# Patient Record
Sex: Male | Born: 1960 | Race: White | Hispanic: No | Marital: Married | Smoking: Current every day smoker
Health system: Southern US, Community
[De-identification: ages and names within clinical notes are randomized; demographics above are authoritative.]

## PROBLEM LIST (undated history)

## (undated) DIAGNOSIS — I6529 Occlusion and stenosis of unspecified carotid artery: Secondary | ICD-10-CM

## (undated) DIAGNOSIS — I2510 Atherosclerotic heart disease of native coronary artery without angina pectoris: Secondary | ICD-10-CM

## (undated) DIAGNOSIS — I219 Acute myocardial infarction, unspecified: Secondary | ICD-10-CM

## (undated) DIAGNOSIS — F419 Anxiety disorder, unspecified: Secondary | ICD-10-CM

## (undated) DIAGNOSIS — I10 Essential (primary) hypertension: Secondary | ICD-10-CM

## (undated) DIAGNOSIS — E785 Hyperlipidemia, unspecified: Secondary | ICD-10-CM

## (undated) DIAGNOSIS — R0602 Shortness of breath: Secondary | ICD-10-CM

## (undated) HISTORY — DX: Acute myocardial infarction, unspecified: I21.9

## (undated) HISTORY — DX: Hyperlipidemia, unspecified: E78.5

## (undated) HISTORY — DX: Shortness of breath: R06.02

## (undated) HISTORY — DX: Anxiety disorder, unspecified: F41.9

## (undated) HISTORY — DX: Occlusion and stenosis of unspecified carotid artery: I65.29

## (undated) HISTORY — DX: Atherosclerotic heart disease of native coronary artery without angina pectoris: I25.10

## (undated) HISTORY — DX: Essential (primary) hypertension: I10

## (undated) HISTORY — PX: FINGER SURGERY: SHX640

## (undated) HISTORY — PX: CORONARY ANGIOPLASTY WITH STENT PLACEMENT: SHX49

---

## 2000-05-03 ENCOUNTER — Inpatient Hospital Stay (HOSPITAL_COMMUNITY): Admission: EM | Admit: 2000-05-03 | Discharge: 2000-05-06 | Payer: Self-pay | Admitting: Cardiology

## 2001-02-22 ENCOUNTER — Encounter: Payer: Self-pay | Admitting: Internal Medicine

## 2001-02-22 ENCOUNTER — Observation Stay (HOSPITAL_COMMUNITY): Admission: EM | Admit: 2001-02-22 | Discharge: 2001-02-23 | Payer: Self-pay | Admitting: Internal Medicine

## 2006-05-06 ENCOUNTER — Ambulatory Visit: Payer: Self-pay | Admitting: Internal Medicine

## 2006-05-06 ENCOUNTER — Ambulatory Visit: Payer: Self-pay

## 2006-05-08 ENCOUNTER — Ambulatory Visit: Payer: Self-pay | Admitting: Internal Medicine

## 2006-05-08 ENCOUNTER — Inpatient Hospital Stay (HOSPITAL_BASED_OUTPATIENT_CLINIC_OR_DEPARTMENT_OTHER): Admission: RE | Admit: 2006-05-08 | Discharge: 2006-05-08 | Payer: Self-pay | Admitting: Internal Medicine

## 2006-05-13 ENCOUNTER — Ambulatory Visit (HOSPITAL_COMMUNITY): Admission: RE | Admit: 2006-05-13 | Discharge: 2006-05-14 | Payer: Self-pay | Admitting: Cardiology

## 2006-05-13 ENCOUNTER — Ambulatory Visit: Payer: Self-pay | Admitting: Cardiology

## 2006-05-21 ENCOUNTER — Encounter (HOSPITAL_COMMUNITY): Admission: RE | Admit: 2006-05-21 | Discharge: 2006-08-19 | Payer: Self-pay | Admitting: Cardiology

## 2006-06-04 ENCOUNTER — Ambulatory Visit: Payer: Self-pay | Admitting: Internal Medicine

## 2006-06-04 ENCOUNTER — Ambulatory Visit: Payer: Self-pay | Admitting: Cardiology

## 2006-08-13 ENCOUNTER — Ambulatory Visit: Payer: Self-pay | Admitting: Internal Medicine

## 2006-11-09 ENCOUNTER — Ambulatory Visit: Payer: Self-pay | Admitting: Internal Medicine

## 2006-11-09 LAB — CONVERTED CEMR LAB
ALT: 26 units/L (ref 0–40)
AST: 27 units/L (ref 0–37)
Albumin: 3.7 g/dL (ref 3.5–5.2)
Alkaline Phosphatase: 55 units/L (ref 39–117)
BUN: 8 mg/dL (ref 6–23)
Bilirubin, Direct: 0.1 mg/dL (ref 0.0–0.3)
CO2: 27 meq/L (ref 19–32)
Calcium: 9.1 mg/dL (ref 8.4–10.5)
Chloride: 107 meq/L (ref 96–112)
Cholesterol: 221 mg/dL (ref 0–200)
Creatinine, Ser: 0.7 mg/dL (ref 0.4–1.5)
Direct LDL: 147.1 mg/dL
GFR calc Af Amer: 155 mL/min
GFR calc non Af Amer: 128 mL/min
Glucose, Bld: 92 mg/dL (ref 70–99)
HDL: 32.9 mg/dL — ABNORMAL LOW (ref >39.0)
Potassium: 4.3 meq/L (ref 3.5–5.1)
Sodium: 140 meq/L (ref 135–145)
Total Bilirubin: 0.6 mg/dL (ref 0.3–1.2)
Total CHOL/HDL Ratio: 6.7
Total Protein: 6.6 g/dL (ref 6.0–8.3)
Triglycerides: 168 mg/dL — ABNORMAL HIGH (ref 0–149)
VLDL: 34 mg/dL (ref 0–40)

## 2006-11-17 ENCOUNTER — Ambulatory Visit: Payer: Self-pay | Admitting: Internal Medicine

## 2007-01-30 ENCOUNTER — Ambulatory Visit: Payer: Self-pay | Admitting: Cardiology

## 2007-01-30 ENCOUNTER — Inpatient Hospital Stay (HOSPITAL_COMMUNITY): Admission: EM | Admit: 2007-01-30 | Discharge: 2007-02-02 | Payer: Self-pay | Admitting: *Deleted

## 2007-02-15 ENCOUNTER — Ambulatory Visit: Payer: Self-pay | Admitting: Internal Medicine

## 2007-02-15 LAB — CONVERTED CEMR LAB
Cholesterol: 152 mg/dL (ref 0–200)
HDL: 21.9 mg/dL — ABNORMAL LOW (ref >39.0)
LDL Cholesterol: 94 mg/dL (ref 0–99)
Total CHOL/HDL Ratio: 6.9
Triglycerides: 179 mg/dL — ABNORMAL HIGH (ref 0–149)
VLDL: 36 mg/dL (ref 0–40)

## 2007-02-17 ENCOUNTER — Ambulatory Visit: Payer: Self-pay | Admitting: Cardiology

## 2007-02-18 ENCOUNTER — Encounter (HOSPITAL_COMMUNITY): Admission: RE | Admit: 2007-02-18 | Discharge: 2007-05-19 | Payer: Self-pay | Admitting: Cardiology

## 2007-04-29 ENCOUNTER — Ambulatory Visit: Payer: Self-pay | Admitting: Internal Medicine

## 2007-07-31 ENCOUNTER — Ambulatory Visit: Payer: Self-pay | Admitting: Cardiology

## 2007-07-31 ENCOUNTER — Observation Stay (HOSPITAL_COMMUNITY): Admission: EM | Admit: 2007-07-31 | Discharge: 2007-08-01 | Payer: Self-pay | Admitting: Emergency Medicine

## 2008-06-23 ENCOUNTER — Emergency Department (HOSPITAL_COMMUNITY): Admission: EM | Admit: 2008-06-23 | Discharge: 2008-06-23 | Payer: Self-pay | Admitting: Emergency Medicine

## 2010-05-14 ENCOUNTER — Emergency Department (HOSPITAL_COMMUNITY): Admission: EM | Admit: 2010-05-14 | Discharge: 2010-05-15 | Payer: Self-pay | Admitting: Emergency Medicine

## 2010-08-25 ENCOUNTER — Emergency Department (HOSPITAL_COMMUNITY)
Admission: EM | Admit: 2010-08-25 | Discharge: 2010-08-25 | Payer: Self-pay | Source: Home / Self Care | Admitting: Emergency Medicine

## 2010-08-25 ENCOUNTER — Encounter: Payer: Self-pay | Admitting: Internal Medicine

## 2010-09-16 ENCOUNTER — Ambulatory Visit: Admit: 2010-09-16 | Payer: Self-pay | Admitting: Internal Medicine

## 2010-09-17 ENCOUNTER — Encounter (INDEPENDENT_AMBULATORY_CARE_PROVIDER_SITE_OTHER): Payer: Self-pay | Admitting: *Deleted

## 2010-10-10 NOTE — Letter (Signed)
Summary: [facility]  [facility]   Imported By: Marylou Mccoy 09/04/2010 12:31:39  _____________________________________________________________________  External Attachment:    Type:   Image     Comment:   External Document

## 2010-10-10 NOTE — Letter (Signed)
Summary: Appointment - Missed  [facility], Main Office  [address]   [place], [place] [address]   Phone: [phone]  Fax: [phone]     September 17, 2010 MRN: [id number]   SONYA PUCCI [address] [place], [place]  [address]   Dear Mr. Fredlund,  Our records indicate you missed your appointment on 09/16/10 with Dr. Gala Romney .It is very important that we reach you to reschedule this appointment. We look forward to participating in your health care needs. Please contact us at the number listed above at your earliest convenience to reschedule this appointment.     Sincerely,  Artist

## 2010-11-11 ENCOUNTER — Ambulatory Visit (INDEPENDENT_AMBULATORY_CARE_PROVIDER_SITE_OTHER): Payer: Medicare Other | Admitting: Internal Medicine

## 2010-11-11 ENCOUNTER — Encounter: Payer: Self-pay | Admitting: Internal Medicine

## 2010-11-11 DIAGNOSIS — E785 Hyperlipidemia, unspecified: Secondary | ICD-10-CM

## 2010-11-11 DIAGNOSIS — R0989 Other specified symptoms and signs involving the circulatory and respiratory systems: Secondary | ICD-10-CM

## 2010-11-11 DIAGNOSIS — I2510 Atherosclerotic heart disease of native coronary artery without angina pectoris: Secondary | ICD-10-CM

## 2010-11-11 HISTORY — DX: Atherosclerotic heart disease of native coronary artery without angina pectoris: I25.10

## 2010-11-11 HISTORY — DX: Other specified symptoms and signs involving the circulatory and respiratory systems: R09.89

## 2010-11-18 ENCOUNTER — Telehealth: Payer: Self-pay | Admitting: Internal Medicine

## 2010-11-18 LAB — POCT I-STAT, CHEM 8
Creatinine, Ser: 0.8 mg/dL (ref 0.4–1.5)
HCT: 44 % (ref 39.0–52.0)
Hemoglobin: 15 g/dL (ref 13.0–17.0)
Potassium: 4.7 mEq/L (ref 3.5–5.1)
Sodium: 140 mEq/L (ref 135–145)
TCO2: 28 mmol/L (ref 0–100)

## 2010-11-18 LAB — CBC
MCH: 29.1 pg (ref 26.0–34.0)
Platelets: 263 10*3/uL (ref 150–400)
RBC: 4.98 MIL/uL (ref 4.22–5.81)
RDW: 13.5 % (ref 11.5–15.5)
WBC: 8.5 10*3/uL (ref 4.0–10.5)

## 2010-11-18 LAB — POCT CARDIAC MARKERS
CKMB, poc: <1 ng/mL — ABNORMAL LOW (ref 1.0–8.0)
Myoglobin, poc: 44.9 ng/mL (ref 12–200)

## 2010-11-18 LAB — DIFFERENTIAL
Basophils Absolute: 0 10*3/uL (ref 0.0–0.1)
Eosinophils Absolute: 0.2 10*3/uL (ref 0.0–0.7)
Lymphs Abs: 1.6 10*3/uL (ref 0.7–4.0)
Neutrophils Relative %: 71 % (ref 43–77)

## 2010-11-19 NOTE — Assessment & Plan Note (Signed)
Summary: F3M/per pt call-mj   Visit Type:  Follow-up Primary Provider:  Caffie Damme, [facility] in [place]  CC:  No complaints.  History of Present Illness: Aaron Velez is a very pleasant 50 year old male with a history of coronary artery disease, status post non-ST elevation myocardial infarction in May of 2008 due to restenosis of a previously placed stent.  He underwent balloon angioplasty.  Ejection fraction was 60%.  There was moderate nonobstructive disease elsewhere.  The left circumflex stent was patent.  He also has a history of hypertension, hyperlipidemia, and tobacco use.  He returns today for his first f/u since 2008.  Got disability last November. Doing well. Main problem is anxiety attacks and has gone to [facility] ER several times and treated with Xanax. During the events gets tight in his chest and SOB. Resolves with Xanax. Walks the dog for 20-30 mins 3-4x/day without a problem. Taking all his medicines without a problem. Had stress test at [facility] last year and it was OK.   Still smoking 1-1.5ppd.  No claudication. Lipids being followed by Dr. Katrinka Blazing.       Problems Prior to Update: None  Medications Prior to Update: 1)  None  Current Medications (verified): 1)  Pravastatin Sodium 40 Mg Tabs (Pravastatin Sodium) .... Take One Tablet By Mouth Daily At Bedtime 2)  Plavix 75 Mg Tabs (Clopidogrel Bisulfate) .... Take One Tablet By Mouth Daily 3)  Aspirin 81 Mg Tbec (Aspirin) .... Take One Tablet By Mouth Daily 4)  Carvedilol 25 Mg Tabs (Carvedilol) .... Take One Tablet By Mouth Twice A Day 5)  Lexapro 10 Mg Tabs (Escitalopram Oxalate) .... Take 1 Tablet By Mouth Once A Day 6)  Meloxicam 15 Mg Tabs (Meloxicam) .... Take 1 Tablet By Mouth Once A Day 7)  Multivitamins  Tabs (Multiple Vitamin) .... Take 1 Tablet By Mouth Once A Day 8)  Alprazolam 0.25 Mg Tabs (Alprazolam) .... Q 6 Hours As Needed  Allergies (verified): No Known Drug Allergies  Past  History:  Past Medical History: Last updated: 10/07/2008  1. Coronary artery disease.       a.     Non-ST-elevation MI with stent restenosis Jan 30, 2007,       b.     PCI of RCA with balloon angioplasty May 2008.       c.     ST elevation MI in 2001, bare metal stent to RCA.       d.     Non-ST-elevation MI 2007 with bare metal stent to LCX  2. Medical noncompliance secondary to financial strain.   3. Tobacco abuse   4. Hypertension.   5. Hyperlipidemia.   6. Ejection fraction 60% with no evidence of failure in 2008.   Family History: Last updated: 10/07/2008 Family history positive for hypertension, coronary  artery disease in first degree relatives.  Social History: Last updated: 10/07/2008 Married, four children ages 59-25, one pack per day  smoker, did quit for a period of 3-4 months while insured on Chantix, no  alcohol abuse.  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   Family History: Reviewed history from 10/07/2008 and no changes required. Family history positive for hypertension, coronary  artery disease in first degree relatives.  Social History: Reviewed history from 10/07/2008 and no changes required. Married, four children ages 64-25, one pack per day  smoker, did quit for a period of 3-4 months  while insured on Chantix, no  alcohol abuse.  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   Review of Systems       As per HPI and past medical history; otherwise all systems negative.   Vital Signs:  Patient profile:   50 year old male Height:      69 inches Weight:      139.75 pounds BMI:     20.71 Pulse rate:   50 / minute Pulse rhythm:   irregular Resp:     18 per minute BP sitting:   110 / 60  (right arm) Cuff size:   large  Vitals Entered By: Vikki Ports (November 11, 2010 10:19 AM)  Physical Exam  General:   thin no resp difficulty HEENT: normal Neck: supple. no JVD. Carotids 2+ bilat; bilat bruits L> R. No lymphadenopathy or thryomegaly appreciated. Cor: PMI nondisplaced. Brady and regular No rubs, gallops, murmur. Lungs: clear mildly decreased breath sounds Abdomen: soft, nontender, nondistended. No hepatosplenomegaly. No bruits or masses. Good bowel sounds. Extremities: no cyanosis, clubbing, rash, edema Neuro: alert & orientedx3, cranial nerves grossly intact. moves all 4 extremities w/o difficulty. affect pleasant    Impression & Recommendations:  Problem # 1:  CORONARY ATHEROSCLEROSIS NATIVE CORONARY ARTERY (ICD-414.01) Stable. Recent stress test without ischemia. Continue risk factor modification.   Problem # 2:  CAROTID BRUIT (ICD-785.9) Will check carotid ultrasound.   Problem # 3:  HYPERLIPIDEMIA Followed by PCP. Goal LDL < 70. Continue pravastatin. Can switch to generic atorvastatin if more potent statin needed.  Problem # 4:  TOBACCO ABUSE Counseled on need to quit and gave number for [facility]. He will resume Chantix.   Other Orders: EKG w/ Interpretation (93000) Carotid Duplex (Carotid Duplex)  Patient Instructions: 1)  Your physician has requested that you have a carotid duplex. This test is an ultrasound of the carotid arteries in your neck. It looks at blood flow through these arteries that supply the brain with blood. Allow one hour for this exam. There are no restrictions or special instructions. 2)  Your physician wants you to follow-up in: 1 year.  You will receive a reminder letter in the mail two months in advance. If you don't receive a letter, please call our office to schedule the follow-up appointment.

## 2010-11-21 LAB — CBC
MCH: 30.6 pg (ref 26.0–34.0)
MCHC: 34.7 g/dL (ref 30.0–36.0)
MCV: 88.2 fL (ref 78.0–100.0)
Platelets: 231 10*3/uL (ref 150–400)
RBC: 4.74 MIL/uL (ref 4.22–5.81)
RDW: 13.8 % (ref 11.5–15.5)

## 2010-11-21 LAB — DIFFERENTIAL
Basophils Absolute: 0.1 10*3/uL (ref 0.0–0.1)
Basophils Relative: 1 % (ref 0–1)
Eosinophils Absolute: 0.2 10*3/uL (ref 0.0–0.7)
Eosinophils Relative: 2 % (ref 0–5)
Neutrophils Relative %: 72 % (ref 43–77)

## 2010-11-21 LAB — POCT I-STAT, CHEM 8
Creatinine, Ser: 0.7 mg/dL (ref 0.4–1.5)
HCT: 45 % (ref 39.0–52.0)
Hemoglobin: 15.3 g/dL (ref 13.0–17.0)
Potassium: 4.1 mEq/L (ref 3.5–5.1)
Sodium: 140 mEq/L (ref 135–145)
TCO2: 26 mmol/L (ref 0–100)

## 2010-11-21 LAB — POCT CARDIAC MARKERS
CKMB, poc: <1 ng/mL — ABNORMAL LOW (ref 1.0–8.0)
Myoglobin, poc: 40.8 ng/mL (ref 12–200)

## 2010-11-22 ENCOUNTER — Encounter (INDEPENDENT_AMBULATORY_CARE_PROVIDER_SITE_OTHER): Payer: Medicare Other

## 2010-11-22 DIAGNOSIS — R0989 Other specified symptoms and signs involving the circulatory and respiratory systems: Secondary | ICD-10-CM

## 2010-11-22 DIAGNOSIS — I6529 Occlusion and stenosis of unspecified carotid artery: Secondary | ICD-10-CM

## 2010-11-25 ENCOUNTER — Telehealth: Payer: Self-pay | Admitting: Internal Medicine

## 2010-11-25 DIAGNOSIS — I6529 Occlusion and stenosis of unspecified carotid artery: Secondary | ICD-10-CM

## 2010-11-25 HISTORY — DX: Occlusion and stenosis of unspecified carotid artery: I65.29

## 2010-11-26 NOTE — Progress Notes (Signed)
Summary: question on plavix  Phone Note Call from Patient Call back at Home Phone [phone]   Caller: Patient Reason for Call: Talk to Nurse Summary of Call: pt hs question re plavix.  Initial call taken by: Roe Coombs,  November 18, 2010 9:36 AM  Follow-up for Phone Call        pt ruptured blood vessel in his eye, went to MD this AM they want him to come off Plavix for 2 weeks to give it time to heal, will discuss w/MD and call him back Meredith Staggers, RN  November 18, 2010 10:40 AM   per Dr Clifton James ok to hold Plavix for 2 weeks, left VM for pt Meredith Staggers, RN  November 19, 2010 11:12 AM

## 2010-11-27 ENCOUNTER — Telehealth: Payer: Self-pay | Admitting: Internal Medicine

## 2010-11-27 NOTE — Telephone Encounter (Signed)
LOv,12 faxed to [facility] @ [phone]

## 2010-12-02 ENCOUNTER — Encounter (INDEPENDENT_AMBULATORY_CARE_PROVIDER_SITE_OTHER): Payer: Medicare Other | Admitting: Surgery

## 2010-12-02 DIAGNOSIS — I6529 Occlusion and stenosis of unspecified carotid artery: Secondary | ICD-10-CM

## 2010-12-03 NOTE — Assessment & Plan Note (Signed)
OFFICE VISIT  Aaron Velez, Aaron Velez DOB:  14-Oct-1960                                       12/02/2010 ZOXWR#:[id number]  REASON FOR CONSULTATION:  Right carotid stenosis.  HISTORY:  This is a 50 year old gentleman I am seeing at the request of Dr. Gala Romney for evaluation of asymptomatic right carotid stenosis. The patient recently had a duplex ultrasound of his neck secondary to carotid bruits which showed 80% to 99% right carotid stenosis with 0% to 39% on the left.  The patient denies having any symptoms.  Specifically he denies numbness or weakness in either extremity.  He denies amaurosis fugax.  He denies slurred speech.  The patient has an extensive smoking history.  He has now cut down to 1 pack a day and is trying to quit.  He is getting ready to start Wellbutrin.  He also suffers from hypertension and hyperlipidemia, both of which are medically managed.  He has a history of having had a heart attack in 2001 as well as 2008.  He has undergone coronary artery stenting in 2001 and 2007 and then in-stent stenosis in 2008.  REVIEW OF SYSTEMS:  VASCULAR:  Positive pain in the legs with walking. CARDIAC:  Positive for chest pain, chest tightness, shortness of breath when lying flat and with exertion. All other review of systems are negative, as documented in the encounter form.  PAST MEDICAL HISTORY:  Hypertension, hypercholesterolemia, coronary artery disease.  SOCIAL HISTORY:  He is married with 2 children.  He is retired.  Smokes 1 pack a day.  Does not drink.  FAMILY HISTORY:  Positive for hypertension, coronary artery disease.  ALLERGIES:  None.  PHYSICAL EXAMINATION:  Heart rate 60, blood pressure 102/65 on the right and 199/66 on the left, respiratory rate 14.  General:  He is well- appearing in no distress.  HEENT within normal limits.  Lungs are clear bilaterally.  Cardiovascular:  Regular rate and rhythm.  Bilateral carotid bruits.   Abdomen:  Soft, nontender.  No pulsatile mass. Musculoskeletal is without major deformities.  Skin is without rash. Neurologically, he is intact without focal deficits.  Cranial nerves II- XII are grossly intact.  DIAGNOSTIC STUDIES:  I have reviewed his ultrasound from [place] which shows 80% to 99% right carotid stenosis in the proximal internal carotid artery.  There is 0% to 39% stenosis on the left.  ASSESSMENT/PLAN:  Asymptomatic right carotid stenosis.  I discussed the pros and cons of carotid revascularization with the patient.  He has elected to proceed with carotid endarterectomy.  We also discussed stenting.  However, I think he is a better candidate for an endarterectomy.  I discussed the risks of surgery, including the risk of nerve injury, the risk of stroke, the risk of cardiopulmonary complications.  All of his questions were answered.  I have scheduled his operation for this Thursday March 29th.  I am electing to keep him on Plavix with all of his coronary issues in the past.  We also discussed smoking cessation.    Jorge Ny, MD Electronically Signed  VWB/MEDQ  D:  12/02/2010  T:  12/03/2010  Job:  [id number]  cc:   Bevelyn Buckles. Bensimhon, MD Dr Jeanie Sewer

## 2010-12-04 ENCOUNTER — Telehealth: Payer: Self-pay | Admitting: Internal Medicine

## 2010-12-04 ENCOUNTER — Encounter (HOSPITAL_COMMUNITY)
Admission: RE | Admit: 2010-12-04 | Discharge: 2010-12-04 | Disposition: A | Discharge status: Home / Self Care | Payer: Medicare Other | Source: Ambulatory Visit | Attending: Surgery | Admitting: Surgery

## 2010-12-04 ENCOUNTER — Ambulatory Visit (HOSPITAL_COMMUNITY)
Admission: RE | Admit: 2010-12-04 | Discharge: 2010-12-04 | Disposition: A | Discharge status: Home / Self Care | Payer: Medicare Other | Source: Ambulatory Visit | Attending: Surgery | Admitting: Surgery

## 2010-12-04 ENCOUNTER — Other Ambulatory Visit: Payer: Self-pay | Admitting: Surgery

## 2010-12-04 DIAGNOSIS — Z01812 Encounter for preprocedural laboratory examination: Secondary | ICD-10-CM | POA: Insufficient documentation

## 2010-12-04 DIAGNOSIS — I10 Essential (primary) hypertension: Secondary | ICD-10-CM | POA: Insufficient documentation

## 2010-12-04 DIAGNOSIS — I6529 Occlusion and stenosis of unspecified carotid artery: Secondary | ICD-10-CM | POA: Insufficient documentation

## 2010-12-04 DIAGNOSIS — I6521 Occlusion and stenosis of right carotid artery: Secondary | ICD-10-CM

## 2010-12-04 DIAGNOSIS — Z01818 Encounter for other preprocedural examination: Secondary | ICD-10-CM | POA: Insufficient documentation

## 2010-12-04 DIAGNOSIS — Z01811 Encounter for preprocedural respiratory examination: Secondary | ICD-10-CM | POA: Insufficient documentation

## 2010-12-04 LAB — PROTIME-INR
INR: 1.01 (ref 0.00–1.49)
Prothrombin Time: 13.5 seconds (ref 11.6–15.2)

## 2010-12-04 LAB — CBC
Hemoglobin: 14.9 g/dL (ref 13.0–17.0)
MCH: 29 pg (ref 26.0–34.0)
MCHC: 33.9 g/dL (ref 30.0–36.0)
MCV: 85.6 fL (ref 78.0–100.0)
Platelets: 191 10*3/uL (ref 150–400)
RBC: 5.13 MIL/uL (ref 4.22–5.81)

## 2010-12-04 LAB — COMPREHENSIVE METABOLIC PANEL
BUN: 16 mg/dL (ref 6–23)
CO2: 29 mEq/L (ref 19–32)
Calcium: 10.2 mg/dL (ref 8.4–10.5)
Chloride: 103 mEq/L (ref 96–112)
Creatinine, Ser: 0.78 mg/dL (ref 0.4–1.5)
GFR calc non Af Amer: >60 mL/min (ref >60)
Glucose, Bld: 84 mg/dL (ref 70–99)
Total Bilirubin: 0.6 mg/dL (ref 0.3–1.2)

## 2010-12-04 LAB — URINALYSIS, ROUTINE W REFLEX MICROSCOPIC
Bilirubin Urine: NEGATIVE
Glucose, UA: NEGATIVE mg/dL
Ketones, ur: NEGATIVE mg/dL
Protein, ur: NEGATIVE mg/dL
pH: 5.5 (ref 5.0–8.0)

## 2010-12-04 LAB — TYPE AND SCREEN
ABO/RH(D): A POS
Antibody Screen: NEGATIVE

## 2010-12-04 NOTE — Telephone Encounter (Signed)
Faxed EKG to [place] at [facility] ([phone]).

## 2010-12-05 ENCOUNTER — Inpatient Hospital Stay (HOSPITAL_COMMUNITY)
Admission: RE | Admit: 2010-12-05 | Discharge: 2010-12-06 | DRG: 039 | Disposition: A | Discharge status: Home / Self Care | Payer: Medicare Other | Source: Ambulatory Visit | Attending: Surgery | Admitting: Surgery

## 2010-12-05 ENCOUNTER — Other Ambulatory Visit: Payer: Self-pay | Admitting: Surgery

## 2010-12-05 DIAGNOSIS — I252 Old myocardial infarction: Secondary | ICD-10-CM

## 2010-12-05 DIAGNOSIS — I10 Essential (primary) hypertension: Secondary | ICD-10-CM | POA: Diagnosis present

## 2010-12-05 DIAGNOSIS — I2510 Atherosclerotic heart disease of native coronary artery without angina pectoris: Secondary | ICD-10-CM | POA: Diagnosis present

## 2010-12-05 DIAGNOSIS — Z01810 Encounter for preprocedural cardiovascular examination: Secondary | ICD-10-CM

## 2010-12-05 DIAGNOSIS — Z01812 Encounter for preprocedural laboratory examination: Secondary | ICD-10-CM

## 2010-12-05 DIAGNOSIS — Z9861 Coronary angioplasty status: Secondary | ICD-10-CM

## 2010-12-05 DIAGNOSIS — F17200 Nicotine dependence, unspecified, uncomplicated: Secondary | ICD-10-CM | POA: Diagnosis present

## 2010-12-05 DIAGNOSIS — I6529 Occlusion and stenosis of unspecified carotid artery: Principal | ICD-10-CM | POA: Diagnosis present

## 2010-12-05 HISTORY — PX: CAROTID ENDARTERECTOMY: SUR193

## 2010-12-05 NOTE — Progress Notes (Signed)
Summary: refer to dr Myra Gianotti  ---- Converted from flag ---- ---- 11/22/2010 1:41 PM, Dolores Patty, MD, [facility] wrote: thanks. heather please refer to [facility] at [facility]. thanks-dan  ---- 11/22/2010 12:18 PM, Maxwell Caul wrote: On 3/16, duplex of the carotids showed 80-99% RICA stenosis, and severe, proximal  LCCA stenosis, suggesting LCCA ostial stenosis. DOD notified, Herbert Seta given preliminary report. ------------------------------  Phone Note Outgoing Call   Call placed by: Meredith Staggers, RN,  November 25, 2010 3:27 PM Summary of Call: pt aware of carotid results, referral made to [facility], will call him w/appt   New Problems: CAROTID ARTERY STENOSIS (ICD-433.10)   New Problems: CAROTID ARTERY STENOSIS (ICD-433.10)

## 2010-12-06 LAB — BASIC METABOLIC PANEL
CO2: 30 mEq/L (ref 19–32)
Calcium: 8.4 mg/dL (ref 8.4–10.5)
Chloride: 105 mEq/L (ref 96–112)
GFR calc Af Amer: >60 mL/min (ref >60)
Potassium: 4 mEq/L (ref 3.5–5.1)
Sodium: 137 mEq/L (ref 135–145)

## 2010-12-06 LAB — CBC
Hemoglobin: 11.7 g/dL — ABNORMAL LOW (ref 13.0–17.0)
MCHC: 33 g/dL (ref 30.0–36.0)
RBC: 4.1 MIL/uL — ABNORMAL LOW (ref 4.22–5.81)
WBC: 10.2 10*3/uL (ref 4.0–10.5)

## 2010-12-08 NOTE — Op Note (Signed)
NAME:  Aaron Velez, Aaron Velez             ACCOUNT NO.:  [id number]  MEDICAL RECORD NO.:  [id number]           PATIENT TYPE:  I  LOCATION:  3313                         FACILITY:  [facility]  PHYSICIAN:  Juleen China IV, MDDATE OF BIRTH:  01/09/1961  DATE OF PROCEDURE:  12/05/2010 DATE OF DISCHARGE:                              OPERATIVE REPORT   PREOPERATIVE DIAGNOSIS:  Asymptomatic right carotid stenosis.  POSTOPERATIVE DIAGNOSIS:  Asymptomatic right carotid stenosis.  PROCEDURE PERFORMED:  Right carotid endarterectomy with patch angioplasty.  SURGEON: 1. Durene Cal IV, MD  ASSISTANTS: 1. Della Goo, PA-C 2. Quita Skye. Hart Rochester, MD  ANESTHESIA:  General.  SPECIMENS:  Right carotid plaque.  FINDINGS:  85% stenosis, no thrombus.  DRAINS:  None.  CULTURES:  None.  INDICATIONS:  This is a 50 year old gentleman with significant past cardiac history who by surveillance ultrasound has progressed to greater than 80% right carotid stenosis, who comes in today for operation. Risks and benefits were detailed in the clinic note.  PROCEDURE:  The patient was identified in the holding area and taken to room #9, placed supine on the table.  General endotracheal anesthesia was administered.  The patient was prepped and draped in the usual fashion.  A time-out was called.  Antibiotics were given.  Incision was made along the anterior border of the right sternocleidomastoid.  A cautery was used to divide the subcutaneous tissue.  The platysma muscle was divided with cautery.  The sternocleidomastoid was reflected laterally.  The internal jugular vein was identified and skeletonized along its anterior medial border.  The common facial vein was identified from the wound and circumferentially exposed and then divided between 2- 0 silk ties and metal clips.  Sharp dissection was then carried down to the carotid artery.  It was found to be rather deep and posterior within the neck.  It  was dissected free.  The vagus nerve was visualized and protected.  There was dense inflammatory reaction around the common carotid artery.  I then proceeded with cephalad dissection.  Superior thyroid artery was first encountered.  There was a branch of the external carotid artery and it was encircled with a Potts 2-0 silk tie. The external carotid artery was then circumferentially exposed and encircled with blue vessel loop.  I then proceeded with cephalad dissection until I identified the hypoglossal nerve.  There were several crossing and subbranches that had to be divided, but they were not divided until I had visualized the hypoglossal.  I then dissected out the distal internal carotid artery and placed an umbilical tape at this point.  Next, the patient was given systemic heparinization.  After heparin had circulated, the internal carotid artery was occluded with a baby Gregory clamp.  The common and external carotid artery were then occluded with vascular clamps.  An #11 blade was used to make an arteriotomy which was extended along the anterolateral surface of the common and internal carotid artery.  Approximately, 85% stenosis was recognized at the carotid bulb.  There was no thrombus.  A 10-French shunt was placed.  Next, endarterectomy was performed using a Kleinert- Administrator, arts.  Eversion  endarterectomy was performed in the external carotid artery.  A good distal endpoint of the plaque was obtained in the internal carotid artery and plaque was removed.  I did have to tack down a flap in the internal carotid artery with a 7-0 Prolene.  The endarterectomized bed was then copiously irrigated and all potential embolic debris was removed.  A bovine pericardial patch was selected. Patch angioplasty was performed using running 6-0 Prolene.  Prior to completion of the patch, the shunt was removed.  The artery was then copiously irrigated with heparinized saline.  The common,  internal, and external carotid arteries were all appropriately flushed.  The patch was then completed.  The clamp on the external carotid artery was released first followed by the common.  After about 20 seconds, the internal carotid clamp was released.  Handheld Doppler was used to evaluate signal in the common, external, and internal carotid arteries.  They all had appropriate signals.  Next, the patient's heparin was reversed with 50 mg of protamine.  After protamine had circulated, there was a generalized ooze from the raw surface edges likely secondary to his Plavix.  I observed this for 10-15 minutes and then I placed FloSeal within the wound.  The wound appeared to be hemostatic at this time. The carotid sheath was reapproximated with 3-0 Vicryl.  The platysma muscles were reapproximated with 3-0 Vicryl.  Skin was closed with 4-0 Vicryl.  Dermabond was placed on the wound.     Jorge Ny, MD     VWB/MEDQ  D:  12/05/2010  T:  12/06/2010  Job:  [id number]  Electronically Signed by Arelia Longest IV MD on 12/08/2010 09:32:15 PM

## 2010-12-16 ENCOUNTER — Ambulatory Visit (INDEPENDENT_AMBULATORY_CARE_PROVIDER_SITE_OTHER): Payer: Medicare Other | Admitting: Surgery

## 2010-12-16 DIAGNOSIS — I6529 Occlusion and stenosis of unspecified carotid artery: Secondary | ICD-10-CM

## 2010-12-17 NOTE — Assessment & Plan Note (Signed)
OFFICE VISIT  CARROLL, LINGELBACH DOB:  Oct 26, 1960                                       12/16/2010 ZOXWR#:[id number]  The patient comes back in today.  He is status post right carotid endarterectomy with patch angioplasty on 12/05/2010.  Operative findings included 85% stenosis.  The patient's postoperative course was uneventful.  He is back today for followup.  He did have a slight marginal mandibular neurapraxia which is improving.  His incision is healing nicely.  There is a nice healing ridge.  Neurologically he is intact.  His incision is well-healed.  I am going to have him come back and see me in 6 months for followup ultrasound.  I am pleased with the progress he is making.    Jorge Ny, MD Electronically Signed  VWB/MEDQ  D:  12/16/2010  T:  12/17/2010  Job:  [id number]  cc:   Bevelyn Buckles. Bensimhon, MD

## 2011-01-02 ENCOUNTER — Encounter: Payer: Self-pay | Admitting: Internal Medicine

## 2011-01-20 ENCOUNTER — Encounter: Payer: Self-pay | Admitting: Internal Medicine

## 2011-01-20 NOTE — Discharge Summary (Signed)
  NAME:  Aaron Velez, Aaron Velez             ACCOUNT NO.:  [id number]  MEDICAL RECORD NO.:  [id number]           PATIENT TYPE:  LOCATION:                                 FACILITY:  PHYSICIAN:  Juleen China IV, MDDATE OF BIRTH:  June 30, 1961  DATE OF ADMISSION: DATE OF DISCHARGE:                              DISCHARGE SUMMARY   ADMIT DIAGNOSIS:  Right internal carotid artery stenosis.  PAST MEDICAL HISTORY AND DISCHARGE DIAGNOSES: 1. Right internal carotid artery stenosis status post right carotid     endarterectomy. 2. Hypertension. 3. Hyperlipidemia. 4. Tobacco abuse. 5. Myocardial infarction in 2001 and 2008, status post coronary     stenting in 2001 and 2007.  BRIEF HISTORY:  The patient is a 50 year old male who Dr. Myra Gianotti saw at the request of Dr. Gala Romney for evaluation of asymptomatic right internal carotid artery stenosis.  Recent duplex revealed a right ICA stenosis of 80%-99% with 0% to 39% in the left.  Again, the patient was asymptomatic.  The patient was scheduled for carotid endarterectomy for stroke risk reduction.  HOSPITAL COURSE:  The patient was admitted and taken to the OR on December 05, 2010 for right carotid endarterectomy with bovine patch angioplasty. The patient tolerated the procedure well and was hemodynamically stable immediately postoperatively.  The patient was transferred from the OR to the postanesthesia care unit in stable condition.  He was extubated without complication and woke up from anesthesia neurologically intact.  The patient's postoperative course has progressed as expected.  On postop day #1, he is afebrile with stable vital signs.  The incision is clean, dry, and intact with slight fullness.  There is some marginal mandibular nerve paresis noted.  He is neurologically intact.  As long as the patient is able to ambulate and void, he will be able to discharge home later this morning.  LABORATORY DATA:  CBC and BMP on December 06, 2010,  white count 10.2, hemoglobin 11.7, hematocrit 35.5, platelets 181.  Sodium 137, potassium 4, BUN 8, creatinine 0.75.  The patient received specific written discharge instructions regarding diet, activity, and wound care.  He is to follow up with Dr. Myra Gianotti in approximately 2 weeks.  He will receive a phone call from the office with a date and time of that appointment.  He may clean the incision with soap and water.  He is not to drive for 2 weeks.  The patient is to call the office with any questions, issues, or problems the interim.  DISCHARGE MEDICATIONS: 1. Percocet 5/325 mg 1-2 q.4-6 h. p.r.n. pain. 2. Alprazolam 0.5 mg 1 p.o. b.i.d. p.r.n. 3. Aspirin 81 mg daily. 4. Bupropion 150 mg b.i.d. 5. Carvedilol 25 mg b.i.d. 6. Lexapro 10 mg daily. 7. Loratadine 10 mg daily. 8. Multivitamin daily. 9. Plavix 75 mg daily. 10.Pravastatin 40 mg nightly.     Pecola Leisure, PA   ______________________________ V. Charlena Cross, MD    AY/MEDQ  D:  12/06/2010  T:  12/06/2010  Job:  [id number]  Electronically Signed by Pecola Leisure PA on 12/12/2010 11:45:27 AM Electronically Signed by Arelia Longest IV MD on 01/20/2011 10:52:46 PM

## 2011-01-21 NOTE — Assessment & Plan Note (Signed)
[facility]                            CARDIOLOGY OFFICE NOTE   BRIAN, ZEITLIN                    MRN:          [id number]  DATE:04/29/2007                            DOB:          09-Mar-1961    INTERVAL HISTORY:  [name] is a very pleasant 50 year old male with  a history of coronary artery disease, status post non-ST elevation  myocardial infarction in May of 2008 due to restenosis of a previously  placed stent.  He underwent balloon angioplasty.  Ejection fraction was  60%.  There was moderate nonobstructive disease elsewhere.  The left  circumflex stent was patent.  He also has a history of hypertension,  hyperlipidemia, and previous tobacco use.  He returns today for routine  followup.   Since we last saw him in June he has done quite well.  He has now been  tobacco free for 3 months.  He was going to cardiac rehab, but had to  stop as he got laid off from his job.  He is also out of several of his  medications due to financial reasons.  He denies any chest pain, no  shortness of breath, he feels quite good.   CURRENT MEDICATIONS:  1. Multivitamin.  2. Baby aspirin.   PHYSICAL EXAMINATION:  He is in no acute distress.  He ambulates around  the clinic easily with no respiratory difficulty.  Blood pressure is 122/76, heart rate is 64, weight is 149.  HEENT:  Normal.  NECK:  Supple, no JVD.  Carotids are 2+ bilaterally without bruits.  There is no lymphadenopathy or thyromegaly.  CARDIAC:  PMI is nondisplaced, he has a regular rate and rhythm.  No  murmurs or rubs.  He has a soft S4.  LUNGS:  Clear.  ABDOMEN:  Soft, nontender, nondistended.  No hepatosplenomegaly, no  bruits, no masses, good bowel sounds.  EXTREMITIES:  Warm with no cyanosis, clubbing, or edema.  Good distal  pulses, no rash.  NEURO:  Alert and oriented x3.  Cranial nerves II-XII are intact.  Moves  all 4 extremities without difficulty.  Affect is pleasant.   ASSESSMENT AND PLAN:  1. Coronary artery disease status post previous myocardial infarction.      He is doing well.  Unfortunately, he is unable to afford most of      his medications.  I would really like him on Plavix, but he simply      cannot afford this.  We will keep him on aspirin, restart his      statin at lovastatin 40, and also treat him with metoprolol 12.5      b.i.d.  2. Hypertension, well controlled.  3. Hyperlipidemia.  He has a history of mixed hyperlipidemia with a      high LDL and high triglycerides.  He was previously on lovastatin      80 and gemfibrozil 600 b.i.d., but this is prohibitive cost wise      for him.  We will restart lovastatin 40 and recheck his lipids in 3      months.  4. Tobacco abuse.  He has now been quit x3 months.  I congratulated      him and urged him to continue to be smoke free.     Bevelyn Buckles. Bensimhon, MD  Electronically Signed    DRB/MedQ  DD: 04/29/2007  DT: 04/30/2007  Job #: [id number]

## 2011-01-21 NOTE — H&P (Signed)
NAMEKEAGON, GLASCOE NO.:  [id number]   MEDICAL RECORD NO.:  [id number]          PATIENT TYPE:  INP   LOCATION:  2807                         FACILITY:  [facility]   PHYSICIAN:  Salvadore Farber, MD  DATE OF BIRTH:  [birth year]   DATE OF ADMISSION:  01/30/2007  DATE OF DISCHARGE:                              HISTORY & PHYSICAL   CHIEF COMPLAINT:  Chest pain.   HISTORY OF PRESENT ILLNESS:  [name] is a 50 year old gentleman who  has had prior non-ST-elevation myocardial infarction in 2001, treated  with bare metal stent to the RCA.  He also underwent bare metal stenting  of the circumflex in the setting of unstable angina and August 2007.  He  has generally been doing well.  Medical compliance has been spotty  primarily due to finances.  He says he is not taken his aspirin or  Plavix over the past couple of days, but did take them this morning.   At 8:30 this morning, he developed sudden onset 10/10 substernal chest  discomfort radiating to his neck and associated with shortness of breath  and diaphoresis.  Immediately with onset he took a sublingual  nitroglycerin.  When pain was not relieved, he called EMS and met them  at the local fire department.  EMS obtain an electrocardiogram that  showed inferior ST elevation myocardial infarction.  He was  hemodynamically stable en route.  He received aspirin and heparin in the  truck.  He arrived the ER with 10/10 substernal chest discomfort  unchanged.   CURRENT MEDICATIONS:  1. Multivitamin.  2. Pravastatin 40 mg daily.  3. Plavix 75 mg daily.  4. Metoprolol 12.5 mg twice daily.  5. Aspirin 81 mg daily.   ALLERGIES:  No known drug allergies.   SOCIAL HISTORY:  The patient works in [employer].  He is married with  four children ranging in age from 50-25.  He smokes at least a pack per  day of cigarettes.  Occasional alcohol use.   FAMILY HISTORY:  Remarkable for premature atherosclerotic disease.   REVIEW  OF SYSTEMS:  Negative in detail except as above.   PAST MEDICAL HISTORY:  1. Coronary disease as detailed the HPI.  2. 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   PHYSICAL EXAMINATION:  VITAL SIGNS:  Uncomfortable appearing man with  heart rate 63, blood pressure 165/99, oxygen saturation of 95% on 2  liters by nasal cannula.  HEENT is normal.  SKIN:  Skin exam is normal except for diaphoresis.  NECK:  He has no jugular venous distension, thyromegaly,  lymphadenopathy.  RESPIRATORY:  Respiratory effort is normal.  Lungs are clear to  auscultation.  CARDIAC:  He has a nondisplaced point maximal cardiac impulse.  There is  a regular rate and rhythm without murmur, rub or S3.  There is an S4.  ABDOMEN:  The abdomen is soft, nondistended, nontender.  There is no  hepatosplenomegaly.  Bowel sounds are normal.  There is no pulsatile  midline mass.  EXTREMITIES:  Warm without clubbing, cyanosis, edema, or ulceration.  PULSES:  Carotid pulses 2+  bilateral without bruit.  Femoral pulses 2+  bilaterally without bruit.  NEUROLOGIC:  He is alert and oriented x3 with normal affect and normal  neurologic exam.   LABORATORY DATA:  Electrocardiogram obtained by EMTs demonstrates normal  sinus rhythm with at least 3 mm of ST elevation in the inferior leads  and reciprocal changes anteriorly.  Right-sided precordial leads  demonstrate greater than 1 mm of ST elevation in lead V4.   IMPRESSION/RECOMMENDATIONS:  Patient with known coronary disease and  prior bare metal stenting in the right coronary artery presents with  acute inferior myocardial infarction.  Electrocardiographic evidence of  right ventricular involvement.  Will proceed to cardiac catheterization  with percutaneous revascularization emergently.  He has already received  aspirin and heparin.  Will give 600 mg of Plavix en route to the cath  lab.      Salvadore Farber, MD  Electronically Signed      WED/MEDQ  D:  01/30/2007  T:  01/30/2007  Job:  [id number]

## 2011-01-21 NOTE — Cardiovascular Report (Signed)
NAME:  Aaron Velez, CHEUVRONT NO.:  [id number]   MEDICAL RECORD NO.:  [id number]          PATIENT TYPE:  INP   LOCATION:  2807                         FACILITY:  [facility]   PHYSICIAN:  Salvadore Farber, MD  DATE OF BIRTH:  Sep 14, 1960   DATE OF PROCEDURE:  01/30/2007  DATE OF DISCHARGE:                            CARDIAC CATHETERIZATION   PROCEDURE:  Left heart catheterization, left ventriculography, coronary  angiography, balloon angioplasty of the right coronary artery, StarClose  closure of the right common femoral arteriotomy site.   INDICATION:  The patient is a 50 year old gentleman with prior bare  metal stenting of the right coronary artery in 2001 in the setting of  non-ST elevation myocardial infarction.  He then underwent bare metal  stenting of the circumflex in August 2007 after presenting with unstable  angina.  He presents this morning with acute inferior myocardial  infarction.  Pain onset was at approximately 8:30 a.m.  He arrived to  [facility] ER at 9:12 a.m. with ongoing 10/10 substernal chest pain and ST  elevations.  The patient was recommended emergent cardiac  catheterization with possible percutaneous coronary intervention.  The  patient agreed and was brought emergently to the Cath Lab.  En route to  the hospital, he was treated with aspirin and heparin.  In the emergency  room, he was treated with 600 mg of Plavix.   PROCEDURAL TECHNIQUE:  Informed consent was obtained.  Under 1%  lidocaine local anesthesia, a 6-French sheath was placed in the right  common femoral artery using the modified Seldinger technique.  Bivalirudin was initiated.  ACT was confirmed to be greater than 225  seconds.  Coronary angiography to the left system was performed using a  JL4 catheter.  Coronary angiography of the right system was performed  using a 6-French JR4 guiding catheter.  These images demonstrated the  culprit lesion to be a 98% stenosis within the previously  stented  segment of the mid right coronary artery.  A decision was made to  proceed to percutaneous revascularization of this.   A Prowater wire was advanced to the distal right coronary without  difficulty.  I initially dilated using a 2.5 x 8 mm Quantum at 18  atmospheres.  We then further dilated using a 3.0 x 12 mm Quantum for  two inflations at 18 atmospheres.  Though there was some disease at the  distal stent margin, this was clearly not the culprit.  Given  inconsistent compliance with medical therapy, we elected not to cover  the whole area with a drug-eluting stent as this would require long-term  Plavix.  Instead, I elected to accept an  excellent angiographic result  in the culprit lesion and manage the remainder medically.   Left heart catheterization and ventriculography were then performed  using a pigtail catheter.  The arteriotomy was then closed using a  StarClose device.  Complete hemostasis was obtained.  The patient was  then transferred to the holding room in stable condition with his chest  pain resolved and electrocardiogram normalized.   COMPLICATIONS:  None.   FINDINGS:  1. LV:  143/0616.  EF 60% with mild inferior hypokinesis.  2. No aortic stenosis or mitral regurgitation.  3. Left main:  Angiographically normal.  4. LAD:  Moderate-sized vessel giving rise to three small diagonals.      There is a 30% stenosis of the mid vessel and minor luminal      irregularities along its course.  5. Circumflex:  A fairly large codominant vessel.  There is a 60%      stenosis proximally.  The mid vessel has a widely patent stent with      no instent restenosis.  The first marginal has a 60% stenosis      proximally.  The AV groove circumflex has a 40% stenosis distally.  6. RCA:  A moderate-sized codominant vessel.  There is a previously      placed stent in the mid vessel.  This had a 98% focal stenosis with      possible associated thrombus.  This was treated with  balloon      angioplasty to no residual.  There is approximately a 30% stenosis      just upstream from the stent and 50% stenosis downstream from the      stent.  These will be managed medically.   IMPRESSION/PLAN:  Successful balloon angioplasty of the culprit lesion  within the previously placed bare metal stent.  He should be maintained  on aspirin, Plavix, and beta blockade.  Smoking cessation was strongly  advised to both he and his family.      Salvadore Farber, MD  Electronically Signed     WED/MEDQ  D:  01/30/2007  T:  01/30/2007  Job:  [id number]

## 2011-01-21 NOTE — Discharge Summary (Signed)
NAME:  Aaron Velez, Aaron Velez NO.:  [id number]   MEDICAL RECORD NO.:  [id number]          PATIENT TYPE:  INP   LOCATION:  3742                         FACILITY:  [facility]   PHYSICIAN:  Bettey Mare. Lawrence, NPDATE OF BIRTH:  1961/03/25   DATE OF ADMISSION:  01/30/2007  DATE OF DISCHARGE:                               DISCHARGE SUMMARY   Audio too short to transcribe (less than 5 seconds)      Bettey Mare. Lyman Bishop, NP     KML/MEDQ  D:  02/02/2007  T:  02/02/2007  Job:  [id number]

## 2011-01-21 NOTE — Discharge Summary (Signed)
NAME:  Aaron Velez, Aaron Velez             ACCOUNT NO.:  [id number]   MEDICAL RECORD NO.:  [id number]          PATIENT TYPE:  OBV   LOCATION:  3702                         FACILITY:  [facility]   PHYSICIAN:  Bevelyn Buckles. Bensimhon, MDDATE OF BIRTH:  1961/01/14   DATE OF ADMISSION:  07/31/2007  DATE OF DISCHARGE:  08/01/2007                               DISCHARGE SUMMARY   DICTATED BY:  Bettey Mare. Lyman Bishop, NP   PRIMARY CARDIOLOGIST:  Bevelyn Buckles. Bensimhon, MD   PRIMARY CARE PHYSICIAN:  Not listed.   PROCEDURES PERFORMED DURING HOSPITALIZATION:  None.   FINAL DISCHARGE DIAGNOSES:  1. Chest pain with history of known coronary artery disease.  Recent      cardiac catheterization Feb 02, 2007 revealing left main      angiographically normal left anterior descending moderate sized      vessel giving rise to 3 diagonals.  There is a 30% stenosis in the      mid vessel and minor illuminal irregularities along its course.      Circumflex is a fairly large codominant vessel.  There is a 60%      stenosis proximally.  The mid vessel has widely patent stent with      on instent stenosis.  The first marginal has 60% stenosis      proximally.  The AV groove circumflex had 40% stenosis distally.      The right coronary artery is a moderate sized codominant vessel.      There was a previously placed stent in the mid vessel.  This had a      98% focal stenosis with possible associated thrombus.  This was      treated with balloon angioplasty, with no residual, with a      proximally 30% stenosis just upstream from the stent and 50%      stenosis downstream from the stent, to be treated medically.      although chest x-ray showed no acute cardiopulmonary process.  The      patient does have some generalized weakness and fatigue.  2. History of non-ST elevated myocardial infarction.  3. Status post cardiac catheterization with percutaneous coronary      intervention into a divided coronary artery.  4. Medical  noncompliance.  5. Tobacco abuse.  6. Hyperlipidemia.   HOSPITAL COURSE:  This is a 50 year old male, with past medical history  significant for multiple MIs, PCA with restenosis in May of 2008,  presenting to the emergency room with complaints of severe left arm and  jaw pain, substernal pain and without exertion, relieved with 2  nitroglycerins.  The patient complained of some lightheadedness, jaw  pain and more tightness.  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  He denies any nausea, vomiting or diaphoresis.  The  patient was seen and examined by Dr. Folsom Bing in the emergency  room and was admitted to rule out myocardial infarction with known  history of CAD.  The patient was scheduled for a stress Myoview the  following morning, but this was unable to be completed as to scheduling  problems.  The patient is without pain this a.m.  Cardiac markers were  found to be negative and chest x-ray was nothing acute.  The patient  will be discharged home for outpatient stress testing at [facility] and possible catheterization if abnormal.   DISCHARGE LABS:  Troponin 0.01, 0.03 and 0.01 respectively.  Cholesterol  127.  Triglycerides 105.  HDL 31.  LDL 75.  Sodium 138.  Potassium 3.6.  Chloride 104.  CO2 29.  Glucose 144.  BUN 11.  Creatinine 0.72.   VITAL SIGNS:  Blood pressure 104/62.  Temperature 97.6.  Pulse 61.  Respirations 20.  O2 saturation 98% on room air.   EKG revealing normal sinus rhythm without evidence of ST-T wave  abnormalities.   DISCHARGE MEDICATIONS:  1. Pravastatin 40 mg daily.  2. Plavix 75 mg daily.  3. Metoprolol 25 mg 1/2 tablet twice a day.  4. Aspirin 81 mg daily.   ALLERGIES:  NO KNOWN DRUG ALLERGIES.   FOLLOWUP PLANS AND APPOINTMENTS:  1. The patient will see Dr. Arvilla Meres in the office.  Our      office will call to make a follow up appointment as this is the      weekend and will be scheduled.  2. The  patient will be scheduled for an outpatient stress myocardial      perfusion study.  He will be scheduled as this is a weekend, and      patient will be called.  The patient has been advised of this and      is willing to proceed with stress test as an outpatient.   Time spent with the patient to include physician time 30 minutes.      Bevelyn Buckles. Bensimhon, MD  Electronically Signed     DRB/MEDQ  D:  08/01/2007  T:  08/01/2007  Job:  [id number]

## 2011-01-21 NOTE — H&P (Signed)
NAME:  Aaron Velez, Aaron Velez NO.:  [id number]   MEDICAL RECORD NO.:  [id number]          PATIENT TYPE:  OBV   LOCATION:  3702                         FACILITY:  [facility]   PHYSICIAN:  Edsel Petrin, D.O.DATE OF BIRTH:  1960/12/28   DATE OF ADMISSION:  07/31/2007  DATE OF DISCHARGE:  08/01/2007                              HISTORY & PHYSICAL   ATTENDING PHYSICIAN:  Cardiology, Gerrit Friends. Dietrich Pates, MD, [facility].   PRIMARY CARDIOLOGIST:  Bevelyn Buckles. Bensimhon, MD.   HISTORY OF PRESENT ILLNESS:  Mr. Cooler is a 50 year old man with a  past medical history significant for multiple MIs and PCI interventions  with restenosis of stents on May 2008, presented to the ED complaining  of severe left arm pain and jaw pain.  No substernal pain, no exertional  pain, relieved with nitroglycerin x2.  He did complain of some  lightheadedness and general weakness.  He does describe the child has  been more tightness.  He also describes the pain in his arms as a  drawing feeling.  Left arm does have some congenital abnormalities in  terms of his musculature with biceps atrophy.  He does report heavy  lifting and painting day before this pain started.  Denies any nausea,  shortness of breath or diaphoresis.   PAST MEDICAL HISTORY:  1. Coronary artery disease.      a.     Non-ST-elevation MI with stent restenosis Jan 30, 2007,      b.     PCI of RCA with balloon angioplasty May 2008.      c.     ST elevation MI in 2001, bare metal stent to RCA.      d.     Non-ST-elevation MI 2007 with bare metal stent to the left       circumflex.  2. Medical noncompliance secondary to financial strain.  3. Tobacco abuse.  4. Hypertension.  5. Hyperlipidemia.  6. Ejection fraction 60% with no evidence of failure in 2008.   HOME MEDICATIONS:  1. Multivitamin.  2. Aspirin 81 mg daily.  3. Plavix, 75 mg p.o. daily started three weeks ago (patient obtains      medicines through the [facility]).  4. Metoprolol 12.5 mg p.o. b.i.d.  5. Lovastatin 40 mg p.o. nightly.   SOCIAL HISTORY:  Married, four children ages 15-25, one pack per day  smoker, did quit for a period of 3-4 months while insured on Chantix, no  alcohol abuse.  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   FAMILY HISTORY:  Family history positive for hypertension, coronary  artery disease in first degree relatives.   REVIEW OF SYSTEMS:  Reflux, otherwise per HPI.   PHYSICAL EXAMINATION:  VITAL SIGNS:  On admission, T-max 97.8, blood  pressure 117/62, respirations 20, SPO2 98% on 2 liters.  GENERAL:  No acute distress, alert, appears well.  HEENT:  Pupils equal, round and reactive to light.  Extraocular muscles  intact, oropharynx clear with dentures in place with no oral  lesions.  NECK:  Positive for left carotid bruit.  No lymphadenopathy, supple.  CARDIOVASCULAR:  Regular rate and rhythm.  No murmurs, rubs or gallops.  LUNGS:  Clear to auscultation bilaterally.  EXTREMITIES:  No edema, left arm with biceps atrophy, no cyanosis or  clubbing.  NEUROLOGIC:  Nonfocal.  ABDOMEN: Soft, nontender.  Bowel sounds positive.  SKIN:  No rashes.   LABORATORY:  Hemoglobin 13.6, wbc 8.6, platelets 236.  Metabolic panel  with sodium 140, potassium 3.9, chloride 104, bicarb 28, BUN 11 glucose  99.  A pCO2 of 50, pH 7.3.  Cardiac point of care markers negative x2.   IMPRESSION:  Mr. Barbian is a 50 year old with an extensive cardiac  history who we are admitting for rule out of acute coronary syndrome.  He has left arm pain and left neck pain which in their descriptions are  atypical and not necessarily associated with an acute event.  This pain  is also in relation to him doing some heavy labor the day prior.  However, given his cardiac history and high risk for stent restenosis  with some questionable medical  noncompliance due to financial reasons,  we will admit him for serial EKGs, cycling of cardiac enzymes, and  telemetry monitoring.   PROBLEM LIST:  1. Coronary artery disease, multivessel, multiple percutaneous      coronary interventions and catheterizations from 2001-2008.  2. Atypical chest pain, left arm pain, left jaw pain.  3. New left carotid bruit, no prior workup.  4. Tobacco abuse.  5. Hyperlipidemia.  6. Hypertension.   PLAN:  1. Will admit to telemetry for 24-hour observation.  2. Will cycle cardiac enzymes, serial EKGs and rule out acute coronary      syndrome.  3. Plan for a stress Myoview in the morning, n.p.o. after midnight.  4. If pain returns or he rules in for MI, we will plan to repeat a      cardiac catheterization for assessment of stent restenosis.  5. We will continue his home medication regimen including beta      blocker, Statin, aspirin and Plavix.      Edsel Petrin, D.O.  Electronically Signed     ELG/MEDQ  D:  07/31/2007  T:  08/01/2007  Job:  [id number]

## 2011-01-21 NOTE — Discharge Summary (Signed)
NAME:  KAIS, MONJE NO.:  [id number]   MEDICAL RECORD NO.:  [id number]          PATIENT TYPE:  INP   LOCATION:  3742                         FACILITY:  [facility]   PHYSICIAN:  Bettey Mare. Lawrence, NPDATE OF BIRTH:  [birth year]   DATE OF ADMISSION:  01/30/2007  DATE OF DISCHARGE:  02/02/2007                               DISCHARGE SUMMARY   ADDENDUM:  The patient has been given a prescription for Chantix per  tobacco cessation regimen with 0.5 mg once a day for three days, 0.5 mg  twice a day for four days, and then 1 mg once a day for three months.  Secondly, the patient has been given a prescription for nitroglycerin  spray to be used p.r.n. chest pain.  He has been advised need to keep  his nitroglycerin with him at all times, and is reinforced smoking  cessation and medical compliance.      Bettey Mare. Lyman Bishop, NP     KML/MEDQ  D:  02/02/2007  T:  02/02/2007  Job:  [id number]

## 2011-01-21 NOTE — Assessment & Plan Note (Signed)
[facility]                            CARDIOLOGY OFFICE NOTE   Aaron Velez, Aaron Velez                    MRN:          [id number]  DATE:02/17/2007                            DOB:          [birth year]    This is a 50 year old married white male patient with a history of  coronary artery disease who presented to the hospital with a non-ST  elevation myocardial infarction. He underwent successful balloon  angioplasty within the previously placed bare metal stent on Jan 30, 2007 by Dr.  Geralynn Rile. Ejection fraction was 60% with mild inferior  hypokinesis and he had some residual scattered disease, 60% proximal  circumflex with widely patent stent and no in stent re-stenosis, 60%  first marginal, 40% AV groove circumflex. The patient then developed a  hematoma of the right iliopsoas which was treated with compression.   Since the patient has been home, he has done quite well. He is walking  30 minutes a day in the morning and evening to avoid the heat without  any symptoms. He quit smoking on Chantix and he is enrolled in cardiac  rehab which starts next week. He worked as a Merchandiser, retail in [employer]  and is anxious to get back to work.   CURRENT MEDICATIONS:  1. Multivitamin daily.  2. Pravastatin 40 mg daily.  3. Plavix 75 mg q daily.  4. Chantix 1 mg b.i.d.  5. Metoprolol 25 mg one-half b.i.d.  6. Aspirin 81 mg daily.   PHYSICAL EXAMINATION:  This is a very pleasant 50 year old white male in  no acute distress. Blood pressure 116/60, pulse 58, weight 146.  NECK: Without JVD, HJR, bruit or thyroid enlargement.  LUNGS:  Are clear anterior, posterior and lateral.  HEART: Regular rate and rhythm at 60 beats per minute. Normal S1, S2.  Positive S4. No murmur, rub, bruits, thrill or heave noted.  ABDOMEN: Soft without organomegaly, masses, lesions or abnormal  tenderness.  EXTREMITIES: Right groin is stable. There is some bruising, but overall  there is no hematoma or hemorrhage or bruits. Lower extremities without  cyanosis, clubbing or edema. He has good distal pulses.   IMPRESSION:  1. Coronary artery disease, status post non-ST elevation myocardial      infarction on Jan 30, 2007, treated with successful balloon      angioplasty within the previously placed bare metal stent of the      right coronary artery (RCA).  2. Non-ST elevation myocardial infarction in 2001, treated with      stenting of the right coronary artery (RCA).  3. Stenting of the left circumflex in 2007 with a bare metal stent.  4. Iliopsoas hematoma on the right, resolved with compression post      cath.  5. Hyperlipidemia.  6. Tobacco abuse, has quit, on Chantix.  7. Congenital atrophy of the left arm.  8. Hyperlipidemia.  9. History of medical noncompliance due to inability to afford      medications in the past, currently taking them and applying for      disability.   PLAN:  At this time, the  patient is doing quite well from a cardiac  standpoint, I have commended him on his smoking cessation and exercise  program. I wrote a note that he could go back to work on light duty  starting Monday as long as he does not lift anything over 10 pounds for  the next few weeks and stays out of the extreme heat. He also starts  cardiac rehab next week to eliminate his hours at work. He will see Dr.  Gala Romney back in followup in a month.      Jacolyn Reedy, PA-C  Electronically Signed      Rollene Rotunda, MD, [facility]  Electronically Signed   ML/MedQ  DD: 02/17/2007  DT: 02/17/2007  Job #: [id number]

## 2011-01-21 NOTE — Discharge Summary (Signed)
NAME:  Aaron Velez, Aaron Velez NO.:  [id number]   MEDICAL RECORD NO.:  [id number]          PATIENT TYPE:  INP   LOCATION:  3742                         FACILITY:  [facility]   PHYSICIAN:  Bevelyn Buckles. Bensimhon, MDDATE OF BIRTH:  [birth year]   DATE OF ADMISSION:  01/30/2007  DATE OF DISCHARGE:  02/02/2007                               DISCHARGE SUMMARY   PROCEDURES PERFORMED DURING HOSPITALIZATION:  1. Cardiac catheterization.      a.     Left main angiographically normal left anterior descending,       moderate-sized vessel giving rise to three diagonals.  There is a       30% stenosis of the mid vessel and minor luminal irregularities       along its course.  Circumflex is a fairly large co-dominant       vessel.  There is 60% stenosis proximally.  The mid vessel has       widely patent stent with no in-stent restenosis.  The first       marginal has 60% stenosis proximally.  The AV groove, circumflex,       had 40% stenosis distally.  The right coronary artery is a       moderate-sized co-dominant vessel.  There is a previously placed       stent in the mid vessel.  This had a 98% focal stenosis with       possible associated thrombus.  This was treated with balloon       angioplasty to no residual.  This was approximately 30% stenosis       just upstream from the stent and a 50% stenosis downstream from       the stent.  These will be managed medically per Dr. Randa Evens.   DISCHARGE DIAGNOSES:  1. Non-ST elevated myocardial infarction.  2. Status post cardiac catheterization with percutaneous coronary      intervention to divided coronary artery.  3. Medical noncompliance.  4. Tobacco abuse.  5. Hyperlipidemia.   HISTORY OF PRESENT ILLNESS:  This is a 50 year old male with a history  of prior ST elevation and infarction in 2001, who was treated with a  bare metal stent in the right coronary artery.  The patient also had a  bare metal stent of the circumflex  in 2007 of August.  The patient has  admitted to medical noncompliance secondary to financial burden.  He has  not been taking his aspirin and Plavix over the last few days; however,  he did take it the morning of admission.  The patient developed sudden  onset of chest pain on the morning of admission, 10/10 substernal,  radiating to his neck with associated shortness of breath and  diaphoresis.  He took nitroglycerin, and the pain was not relieved.  He  did call EMS and did have electrocardiogram completed showing inferior  ST elevation and myocardial infarction.  He was hemodynamically stable  and brought to the emergency room at [facility].   On arrival to the emergency room, the patient was seen by  Dr. Samule Ohm and  was taken to the cardiac catheterization lab emergently.  Cardiac  catheterization was completed per Dr. Samule Ohm with results as discussed  above.  Please see Dr. Melinda Crutch thorough dictation note of the cardiac  catheterization procedure for more details.  Post procedure, the patient  did develop a right-sided iliopsoas hematoma.  He was treated with  direct manual pressure and pain management.  The patient recovered well  and had no further need for further intervention to the right groin  site.  He still had some complaints of leg pain the following day, but  it was less overall.   The patient was followed thoroughly by Dr. Gala Romney and Dr. Deatra James,  Resident, through critical care during his hospitalization.  There was  no noted bleeding acutely, and the patient recovered slowly but well  after procedure.   On the day of discharge, the patient was seen and examined by Dr. Arvilla Meres and found to be stable for discharge.  The patient's blood  pressure was 117/72, heart rate 70, respirations 20.  There was a right  groin discomfort on palpation with some mild ecchymosis noted.   The patient was advised on smoking cessation and was also seen by  cardiac rehab  during hospitalization.  The patient will follow with  cardiac rehab as an outpatient and was restarted back on his Plavix,  Lovastatin, aspirin, and metoprolol prior to discharge.  The patient was  also seen by physical therapy and was found to be doing well and with no  need for followup physical therapy at home.   DISCHARGE LABORATORY:  Hemoglobin 12.3, hematocrit 35.7, white blood  cells 6.1, platelets 202.  Sodium 140, potassium 4.0, chloride 109, CO2  of 26, glucose 101, BUN 8, creatinine 0.64, calcium 8.7, cholesterol  165, triglycerides 264, HDL 23, LDL 89.  CT scan of the pelvis without  contrast reveals right iliopsoas hematoma without free fluid.  EKG dated  Jan 31, 2007, revealed normal sinus rhythm.   DISCHARGE MEDICATIONS:  1. Enteric-coated aspirin 1 p.o. daily.  2. Plavix 75 mg daily.  3. Lovastatin 40 mg 2 tablets at bedtime.  4. Metoprolol ER 25 mg daily.   ALLERGIES:  The patient has no known drug allergies.   FOLLOWUP PLANS AND APPOINTMENTS:  1. The patient will follow up with Dr. Gala Romney or Tereso Newcomer,      Physician Assistant, on June 11 at 11:15 a.m.  The patient has been      advised to call if this appointment is not convenient for him.  2. The patient has been advised he is not to return to work until June      12, after being seen by followup appointment.  3. The patient has been advised on smoking cessation.  4. The patient has been advised to continue to monitor his right groin      site for evidence of recurrence of bleeding, worsening pain, or      signs of infection.  5. The patient is to follow up with his primary care physician for      further medical management.   TIME SPENT WITH THE PATIENT TO INCLUDE PHYSICIAN TIME:  45 minutes.      Bettey Mare. Lyman Bishop, NP      Bevelyn Buckles. Bensimhon, MD  Electronically Signed    KML/MEDQ  D:  02/02/2007  T:  02/02/2007  Job:  [id number]

## 2011-01-22 ENCOUNTER — Ambulatory Visit: Payer: Medicare Other | Admitting: Internal Medicine

## 2011-01-24 NOTE — Discharge Summary (Signed)
NAME:  LYCAN, DAVEE             ACCOUNT NO.:  [id number]   MEDICAL RECORD NO.:  [id number]          PATIENT TYPE:  OIB   LOCATION:  1962                         FACILITY:  [facility]   PHYSICIAN:  Joellyn Rued, PA-C     DATE OF BIRTH:  1961-01-25   DATE OF ADMISSION:  05/08/2006  DATE OF DISCHARGE:  05/08/2006                           DISCHARGE SUMMARY - REFERRING   BRIEF HISTORY:  Mr. Swigert is a 50 year old male who was seen in the  office on 05/06/2006 and was recently seen at [facility] emergency room and  treated with a GI cocktail relieving his discomfort; however a little over a  week ago he began having central chest tightness with tingling to his left  arm resolved with rest; however, with exertion his discomfort would recur.  After being seen at Urgent Care he was referred for a stress test.  Stress  test was performed on 05/06/2006 and EKG showed 2 mm ST-segment depression  V3-V6 which persisted into 9 minutes of recovery.  Thus arrangements were  made for cardiac catheterization.   His history is notable for non-ST elevated myocardial infarction in August  2001.  Catheterization at that time showed EF 65%, 70% mid LAD, 20-35%  circumflex lesion and 90% proximal RCA.  He underwent stenting to the  proximal RCA.  He also has a history of COPD with ongoing tobacco use,  hyperlipidemia, anxiety, depression, GERD and congenital atrophy of the left  arm.   LABORATORY DATA:  Preadmission H&H was 14.5 and 43.9, normal indices,  platelets 245, WBCs 9.4, PT 11.8. INR 0.9, PTT 32.  Sodium 139, potassium  4.1, BUN 8, creatinine 0.7, normal LFTs.  CK-MB was unremarkable.  Postprocedure prior to discharge H&H was 13.6, 39.0, normal indices,  platelets 192, WBC 7.6, sodium 142, potassium 4.1, BUN 12, creatinine 0.82,  glucose 102.  The baseline EKG did not show any specific changes.   HOSPITAL COURSE:  Mr. Capobianco initial catheterization was performed on  05/08/2006 and revealed  a 50% in-stent restenosis of the mid RCA and a 90%  mid circumflex.  He was readmitted on the fifth where Dr. Samule Ohm performed  __________  stenting to the circumflex lesion reducing this from 85% to 0%  without difficulty.  He recommended aspirin indefinitely, Plavix at least  for 30 days and tobacco cessation and he was started on Chantix.  By the  sixth, catheterization site was intact.  He was ambulating without  difficulty.  Dr. Samule Ohm felt that he could be discharged home. Prior to  discharge he was seen by tobacco cessation and cardiac rehabilitation.   DISCHARGE DIAGNOSES:  1. ACL with positive stress test.  2. Progressive coronary artery disease, status post stenting of the      circumflex.  3. Tobacco use history as noted below.   PROCEDURES PERFORMED:  Status post __________  stenting to the circumflex by  Dr. Samule Ohm on 05/13/2006.   DISPOSITION:  He is discharged home, asked to maintain low salt, fat  cholesterol diet, given permission return to work on 05/17/2006.  Activity  and wound care per supplemental discharge  sheet.  He is asked to bring all  medications to all appointments.  He was advised no smoking or tobacco  products.   MEDICATIONS AT TIME OF DISCHARGE:  Increase of his aspirin to 325 q.d.,  Plavix 75 mg q.d., metoprolol 12.5 b.i.d., pravastatin 40 q.h.s., Prevacid  30 q.d., nitroglycerin 0.4 as needed, multivitamin q.d. and Chantix  0.5 mg  q.d. for 3 days and 1 mg daily for 3 days and 1 mg b.i.d.   He will follow up with Dr. Gala Romney 06/04/2006 at 11:30 a.m.   DISCHARGE TIME:  Less than 30 minutes.           ______________________________  Joellyn Rued, PA-C     EW/MEDQ  D:  05/14/2006  T:  05/14/2006  Job:  [id number]   cc:   [place] [address], [address]

## 2011-01-24 NOTE — Assessment & Plan Note (Signed)
[facility]                            CARDIOLOGY OFFICE NOTE   Aaron Velez, Aaron Velez                    MRN:          [id number]  DATE:11/17/2006                            DOB:          13-Sep-1960    INTERVAL HISTORY:  Aaron Velez is a 50 year old male with a history of  coronary artery disease, status post previous non-ST elevation  myocardial infarction in 2001, treated with PCA and stenting of the  right coronary artery, also had PCA and stenting of the left circumflex  in September of 2007 with a bare metal stent.  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  He returns today for routine  followup.  Overall he is doing fairly well.  After his most recent stent  his job let him go.  He was without work for several months and was  unable to take his medications.  He was off almost all his medications  including his pravastatin, Plavix, and Chantix.  He says he is now back  to smoking about a pack a day, he has however resumed all his other  medications within the past few weeks.  He denies any chest pain or  shortness of breath.   CURRENT MEDICATIONS:  1. Multivitamin.  2. Pravastatin 40.  3. Plavix 75.  4. Metoprolol 12.5 b.i.d.  5. Aspirin 81 a day.   PHYSICAL EXAMINATION:  He is well-appearing, in no acute distress.  Blood pressure is 112/64, heart rate is 64.  Weight is 152.  HEENT:  Sclerae anicteric, EOMI, there is no xanthelasmas, mucus  membranes are moist, oropharynx is clear.  NECK:  Supple, no JVD, carotids are 2+ bilaterally without bruits, there  is no lymphadenopathy or thyromegaly.  CARDIAC:  He has a regular rate and rhythm, no murmurs, rubs, or  gallops.  LUNGS:  Clear.  ABDOMEN:  Soft, nontender, nondistended, no hepatosplenomegaly, no  bruits, no mass.  EXTREMITIES:  Warm with no cyanosis, clubbing, or edema.  Left arm has  some moderate atrophy.  NEUROLOGIC:  Alert and oriented x3, cranial nerves II-XII are intact,  moves all 4 extremities without difficulty.   ASSESSMENT/PLAN:  1. Coronary artery disease.  He is quite stable without any evidence      of ongoing ischemia.  We will continue his current medical therapy.  2. Hyperlipidemia.  Most recent LDL was marginally increased at 147.      He tells me has been off his pravastatin, we will restart this and      check his lipids again in 3 months.  I suspect we will need to      titrate this to 80 mg a day.  3. Tobacco use.  I once again reminded him of the risks of this and      suggested he stop smoking.  He still has a prescription for Chantix      at home and he says he is considering refilling this.   DISPOSITION:  Will see him back in 6 months for routine  followup.  He  will get his lipids checked in the next 3 months and we will titrate his  statin as necessary.     Bevelyn Buckles. Bensimhon, MD  Electronically Signed    DRB/MedQ  DD: 11/17/2006  DT: 11/19/2006  Job #: [id number]

## 2011-01-24 NOTE — Assessment & Plan Note (Signed)
[facility]                              CARDIOLOGY OFFICE NOTE   GRAYCEN, SADLON                    MRN:          [id number]  DATE:06/04/2006                            DOB:          Mar 13, 1961    PATIENT IDENTIFICATION:  Aaron Velez is a 50 year old male who returns for  routine followup.   PAST MEDICAL HISTORY:  1. Coronary artery disease.      a.     Status post PTCA and stenting of the left circumflex on       May 13, 2006 with bare metal stent.      b.     Status post non ST elevation myocardial infarction August 2001       with PTCA and stenting of the right coronary artery.      c.     Probable COPD.  2. Hyperlipidemia.  3. Anxiety/depression.  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  5. Gastroesophageal reflux disease.  6. Small infrarenal AAA.      a.     Pending CT scan today.   CURRENT MEDICATIONS:  1. Multivitamin.  2. Pravastatin 40.  3. Plavix 75.  4. Chantix.  5. Metoprolol 12.5 b.i.d.  6. Aspirin 81 a day.   INTERVAL HISTORY:  Mr. Maltese returns today for routine followup, he is  doing wonderfully. He is going to cardiac rehab 3 times a week and walking  on the other 2 days without any chest pain or dyspnea. He has been compliant  with all his medications without problems. He has been taking Chantix and  actually has stopped smoking. He is also planning to join a gym once his  cardiac rehab runs out.   PHYSICAL EXAMINATION:  GENERAL:  Well-appearing, no acute distress.  VITAL SIGNS:  Blood pressure is 98/62, heart rate is 53, his weight is 143.  HEENT:  Sclera anicteric. EOMI. There is no xanthelasma. His mucous  membranes are moist.  NECK:  Supple. There is no JVD. Carotids are 2+ bilaterally without any  bruits. There is no lymphadenopathy or thyromegaly.  CARDIAC:  Bradycardic and regular. No murmurs, rubs or gallops.  LUNGS:  Clear.  ABDOMEN:  Soft, nontender, nondistended. No hepatosplenomegaly, no  bruits,  no masses, good bowel sounds.  EXTREMITIES:  Warm with no cyanosis, clubbing or edema and his right femoral  arteriotomy site there is no hematoma or bruit.  NEUROLOGIC:  Alert and oriented x3. Cranial nerves II-XII are intact. He  moves all 4 extremities without difficulty.   ASSESSMENT/PLAN:  1. Coronary artery disease. He is quite stable status post stenting. He is      on an excellent medical regimen and continues with cardiac rehab. Will      continue current treatment.  2. Hyperlipidemia. Continue Pravastatin. We will check lipids in 3 months.  3. Abdominal aortic aneurysm. This is small. He will need routine      followup. We will await the results of his CT.  4. Disposition. Return to clinic in 6 months for routine followup.       Bevelyn Buckles.  Bensimhon, MD     DRB/MedQ  DD:  06/04/2006  DT:  06/06/2006  Job #:  [id number]

## 2011-01-24 NOTE — Discharge Summary (Signed)
[place]  Patient:    [name], Aaron Velez                    MRN: [id number] Adm. Date:  [id number] Disc. Date: 02/23/01 Attending:  Nelta Numbers Dictator:   Tereso Newcomer, P.A. CC:         [name]. Revankar, M.D.,  [facility]   Discharge Summary  DISCHARGE DIAGNOSES: 1. Chest pain, etiology unclear, suspect gastroesophageal reflux disease;    myocardial infarction ruled out. 2. Coronary artery disease. 3. History of non-Q-wave myocardial infarction in August 2001, treated with    elective right coronary artery stent. 4. Residual coronary artery disease, 70% left anterior descending stenosis in    August 2001. 5. Hyperlipidemia. 6. History of tobacco abuse. 7. Anxiety/depression.  HOSPITAL COURSE:  This 50 year old male presented to the emergency room with complaints of chest pain on February 22, 2001.  It occurred after eating.  He had no shortness of breath, nausea, vomiting, radiation or diaphoresis with this pain.  He did take some nitroglycerin with slight relief only.  Prior to this he denied any history of exertional chest pain, shortness of breath.  In the emergency room his exam was notable for neck without JVD or bruits; lungs clear to auscultation; heart regular rate and rhythm; abdomen soft, positive bowel sounds, no tenderness; extremities with no edema.  EKG was without acute change.  Chest x-ray: Left basilar atelectasis versus scar, no evidence of acute disease otherwise.  He was admitted for chest pain, placed on IV heparin and ruled out for MI by enzymes.  On the morning of February 23, 2001, he was without further chest pain.  His EKG was without change and he remained in sinus rhythm.  At the time of this dictation his lipid panel is pending.  His CK-MB and troponin I are negative x 2.  It is felt that he is stable enough for discharge to home.  He will go home on a proton pump inhibitor for suspected GERD.   He will need followup stress Cardiolite this week to rule out ischemia and it is felt that he is stable enough to do this as an outpatient. He should follow up with Dr. Tomie China in 1-2 weeks.  LABORATORIES:  Sodium 139, potassium 3.5, chloride 108, CO2 27, glucose 94, BUN 8, creatinine 0.8, AST 23, ALT 27, alkaline phosphatase 64, total bilirubin 0.7, total protein 7.3, albumin 4.3.  INR 1.1.  WBC count 6000, hemoglobin 13.5, hematocrit 39.1, platelet count 235,000.  Lipid panel pending.  DISCHARGE MEDICATIONS: 1. Prilosec 20 mg q.d. 2. Zocor 10 mg q.h.s. 3. Metoprolol 25 mg b.i.d. 4. Wellbutrin 150 mg b.i.d. 5. Aspirin 81 mg a day. 6. Nitroglycerin p.Velez.n. chest pain.  ACTIVITY:  No heavy lifting or strenuous exertion until the results of his stress test known.  DIET:  Low-fat, low-sodium diet.  FOLLOWUP:  He is to follow up with Dr. Tomie China in 1-2 weeks and he should call for an appointment.  He will be set up for a stress Cardiolite this week. DD:  02/23/01 TD:  02/23/01 Job: [id number]

## 2011-01-24 NOTE — Discharge Summary (Signed)
[place]  Patient:    Aaron Velez, Aaron Velez                    MRN: [id number] Adm. Date:  [id number] Disc. Date: 05/05/00 Attending:  Nelta Numbers Dictator:   Abelino Derrick, P.A.C. [facility] CC:         Shiv K. Harsh, M.D., [address] [place], [place]  Maisie Fus D. Riley Kill, M.D. [facility]   Referring Physician Discharge [place]  DISCHARGE DIAGNOSES 1. Subacute endocardial myocardial infarction, this admission, treated with    elective right coronary artery stenting. 2. Residual 70% left anterior descending narrowing. 3. Hyperlipidemia. 4. History of smoking.  HOSPITAL COURSE:  Patient is a 50 year old male who presented to [facility] Emergency Room with severe chest pain.  He was started on nitroglycerin, aspirin and heparin and transferred to [facility]  His EKG had inferior ST elevation.  By the time he arrived here, he was without pain and his EKG had normalized.  Initial CK-MBs here were negative but his troponin was 0.14.  Second CK went to 189 with an MB of 17 and a troponin of 0.7.  He was electively catheterized on May 05, 2000 by Dr. Rollene Rotunda; this revealed a 90% RCA and 70% LAD.  He underwent stenting to the RCA by Dr. Arturo Morton. Stuckey.  He was put on Aggrastat.  He tolerated the procedure well and was stable postoperatively.  He was ready for discharge on the 29th. He will follow up with Dr. Gilford Raid K. Harsh for further assessment of his LAD lesion.  DISCHARGE MEDICATIONS 1. Coated aspirin q.d. 2. Lopressor 25 mg b.i.d. 3. Plavix 75 mg a day for four weeks. 4. Nitroglycerin sublingual p.r.n.  LABORATORY DATA:  Sodium 143, potassium 3.9, BUN 10, creatinine 0.6.  White count 7.8, hemoglobin 12.7, hematocrit 36.8, platelets 206,000.  INR is 1.2. Liver functions were normal.  CKs are as noted.  Cholesterol was 224, HDL 27, LDL 158.  EKG shows sinus rhythm with no acute changes.  DISPOSITION:  Patient is discharged in  stable condition and will follow up with Dr. Sylvie Farrier in [place]. DD:  05/06/00 TD:  05/06/00 Job: [id number]

## 2011-01-24 NOTE — Assessment & Plan Note (Signed)
[facility]                              CARDIOLOGY OFFICE NOTE   AVAN, GULLETT                    MRN:          [id number]  DATE:05/06/2006                            DOB:          Mar 01, 1961    PATIENT IDENTIFICATION:  Aaron Velez is a 50 year old male with known  coronary artery disease, who is status post non-ST elevation MI, which was  followed by PTCA and stenting of his right coronary artery in August 2001.   PAST MEDICAL HISTORY:  1. Coronary artery disease.      a.     Status post non-ST elevation myocardial infarction in August       2001.      b.     Cardiac catheterization August 2001, EF of 65%.  Left main was       normal, LAD 70% mid, left circumflex multiple 25-35% lesions, right       coronary 90% proximal ruptured plaque.  Status post PTCA and stenting.       Type of stent unclear.  PTCA and stenting of the right coronary       artery, type of stenting was unclear.  2. 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  3. Hyperlipidemia.  4. Anxiety/depression.  5. Gastroesophageal reflux disease.  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   CURRENT MEDICATIONS:  Prevacid.   ALLERGIES:  No known drug allergies.   HISTORY OF PRESENT ILLNESS:  Aaron Velez is a very pleasant 50 year old  male with a history of coronary artery disease, status post a non-ST  elevation myocardial infarction in August 2001, treated with PCA and  stenting of the right coronary artery.  There was a 70% residual stenosis.  Since his stent he has done relatively well.  He had one episode of chest  discomfort in 2004.  He was seen at [facility] and treated with a GI  cocktail, which promptly relieved his pain.  Approximately a week and a half  ago he was at work when he had central chest tightness and tingling down his  left arm.  He took a break and this resolved.  He went back to work and the  chest pain recurred.  The pain then resolved and then  recurred the next  morning.  That afternoon he went to Urgent Care.  He was referred here for a  stress test.  He was also started on Prevacid.  Since starting Prevacid he  has not had further pain, though he has also been taking it very easy due to  his concern that this may be his heart.  He denies heart failure, denies  palpitations.  He has not had claudication.  He denies syncope or  presyncope.  He is not taking any medications except for his Prevacid.  He  has not taken aspirin.   REVIEW OF SYSTEMS:  As per HPI and problem list, otherwise all systems  negative.   SOCIAL HISTORY:  He lives in [place] with his wife and 2 kids.  He  previously smoked 3 packs a day, now smokes 1-1/2 to  2 packs a day.  Rare  alcohol.  No drugs.   FAMILY HISTORY:  Mother is alive and well at 31.  Father is alive at 25, but  he does not know much about him.  His siblings do not have heart disease.   PHYSICAL EXAMINATION:  GENERAL:  He is in no acute distress, and he  ambulates around the clinic without any respiratory difficulty.  Resting  blood pressure 123/57 with a heart rate of 68.  HEENT:  Sclerae anicteric, EOMI.  There is no xanthelasma.  Mucous membranes  are moist.  NECK:  Supple.  There is no JVD.  Carotid are 2+ bilaterally without any  bruits.  There is no lymphadenopathy or thyromegaly.  CARDIAC:  Regular rate and rhythm, no murmurs, rubs or gallops.  LUNGS:  Mildly decreased breath sounds throughout without any wheezing or  rales.  ABDOMEN:  Soft, nontender, nondistended, no hepatosplenomegaly, no bruits,  no masses.  EXTREMITIES:  Warm with no cyanosis, clubbing or edema.  DP pulses are 1+  bilaterally.  He has congenital atrophy of the left upper extremity.  NEUROLOGIC:  He is alert and oriented x3.  Cranial nerves II-XII are intact.  He moves all 4 extremities without difficulty.   Results of exercise treadmill test are as dictated separately but notable  for 2-3 mm of ST  depression around 8 minutes into exercise, associated with  5/10 chest pain.   ASSESSMENT:  1. Chest pain.  This is very concern for recurrent angina.  I have started      him on aspirin 81 mg a day as well as pravastatin 40 mg a day and      metoprolol 12.5 mg b.i.d.  We have given him sublingual nitroglycerin.      We have scheduled him for a JV outpatient catheterization on Friday.      Should he have worsening chest pain, he will need to go the ER      emergently.  We did not start Plavix due to the fear of possible 3-      vessel disease and the need for surgical intervention.  2. Tobacco use, ongoing.  I once again reminded him of the need to stop      smoking.  3. Hyperlipidemia.  We have started a statin.  We will follow his lipids      and liver panel closely.   DISPOSITION:  Pending results of his catheterization.                                Bevelyn Buckles. Bensimhon, MD    DRB/MedQ  DD:  05/06/2006  DT:  05/07/2006  Job #:  [id number]

## 2011-01-24 NOTE — Cardiovascular Report (Signed)
[place]  Patient:    Aaron Velez, Aaron Velez                    MRN: [id number] Proc. Date: 05/05/00 Adm. Date:  [id number] Attending:  Nelta Numbers CC:         Shiv K. Harsh, M.D.                        Cardiac Catheterization  DATE OF BIRTH:  1960-11-02.  PRIMARY CARDIOLOGIST:  Shiv K. Harsh, M.D.  PROCEDURE:  Left heart catheterization/coronary arteriography.  INDICATION:  Evaluate patient with unstable angina.  PROCEDURAL NOTE:  Left heart catheterization was performed via the right femoral artery.  The artery was cannulated using an anterior wall puncture.  A #6-French arterial sheath was inserted via the modified Seldinger technique; preformed Judkins and a pigtail catheter were utilized.  The patient tolerated the procedure well and left the lab in stable condition.  RESULTS Hemodynamics:  LV 133/19, Ao 133/71.  Coronaries:  The left main was normal.  The LAD had multiple 25% lesions. There was a mid 70% stenosis.  The circumflex had multiple 25-30% lesions and diffuse luminal irregularities throughout.  The right coronary artery was a dominant vessel.  There was a 90% proximal ruptured plaque, followed by diffuse 25-30% lesions.  Left ventriculogram:  The left ventriculogram was obtained in the RAO projection.  The EF was 65%, with normal wall motion.  CONCLUSION:  Severe right coronary artery stenosis with nonobstructive left anterior descending stenosis and diffuse luminal irregularities throughout.  PLAN:  The patient will have percutaneous revascularization of the right coronary artery.  He should have a Cardiolite to follow up the hemodynamic significance of the LAD in the weeks to come.  He will need aggressive secondary risk factor modification, including the need to stop smoking.  DD: 05/05/00 TD:  05/05/00 Job: [id number]

## 2011-01-24 NOTE — Cardiovascular Report (Signed)
NAME:  MAMOUDOU, MULVEHILL NO.:  [id number]   MEDICAL RECORD NO.:  [id number]          PATIENT TYPE:  OIB   LOCATION:  6531                         FACILITY:  [facility]   PHYSICIAN:  Salvadore Farber, MD  DATE OF BIRTH:  Aug 26, 1961   DATE OF PROCEDURE:  05/13/2006  DATE OF DISCHARGE:                              CARDIAC CATHETERIZATION   PROCEDURE:  Bare-metal stent placement in the proximal circumflex,  intravascular ultrasound of the circumflex, Starclose closure of the right  common femoral arteriotomy site.   INDICATIONS:  Mr. Mcglasson is a 50 year old gentleman status post inferior  myocardial infarction with stenting of the right coronary artery in 2001.  He now presents with recurrent class III angina.  Exercise test was positive  prompting outpatient cardiac catheterization.  This demonstrated  approximately 50% in-stent stenosis on the right coronary artery, preserved  overall left ventricular systolic function, an 85% stenosis of the proximal  circumflex.  He returns today for angioplasty on the circumflex.   PROCEDURAL TECHNIQUE:  Informed consent was obtained.  Under 1% lidocaine  local anesthesia, a 6-French sheath was placed in the right common femoral  artery using modified Seldinger technique.  Anticoagulation was initiated  with bivalirudin.  ACT was confirmed to be greater than 225 seconds.  The  patient had been maintained on aspirin and Plavix for 5 days prior to the  procedure.   A 6-French Voda left 3.5 guide was advanced over wire and engaged in the  ostium of the left main.  A Prowater wire was advanced to the distal obtuse  marginal without difficulty.  The lesion was directly stented using a 2.75 x  12 mm Liberte stent deployed at 16 atmospheres.  It was then postdilated  using a 3.0 x 12 mm Quantum balloon at 20 atmospheres.  There was a question  of a proximal edge dissection.  Intravascular ultrasound was performed.  It  demonstrated full  stent expansion and excellent apposition and no edge  dissection.  Final angiography demonstrated no residual stenosis, no  dissection, and TIMI III flow to the distal vasculature.   The arteriotomy was then closed using a Starclose device.  The patient was  transferred to the holding room in stable condition having tolerated the  procedure well.   IMPRESSION/RECOMMENDATIONS:  Successful revascularization of the proximal  circumflex using a bare-metal stent.  He should be maintained on Plavix for  30 days and aspirin indefinitely.  Smoking cessation is again strongly  advised.  Will initiate Chantix.      Salvadore Farber, MD  Electronically Signed     WED/MEDQ  D:  05/13/2006  T:  05/13/2006  Job:  [id number]   cc:   Bevelyn Buckles. Bensimhon, MD

## 2011-01-24 NOTE — Op Note (Signed)
[place]  Patient:    Aaron Velez, Aaron Velez                    MRN: [id number] Adm. Date:  [id number] Attending:  Nelta Numbers CC:         Shiv K. Harsh, M.D.  Rollene Rotunda, M.D. [facility]  CV Laboratory   Operative Report  INDICATIONS:  The patient is a 50 year old with non-Q wave myocardial infarction.  He underwent cardiac catheterization by Dr. Antoine Poche which revealed about a 70% LAD stenosis, and a ruptured plaque in the mid right coronary.  He also had some segmental plaquing in the right coronary distal to the area of ruptured plaque with tandem 40% lesions.  Percutaneous coronary intervention was recommended.  DESCRIPTION OF PROCEDURE:  The procedure was performed from the right femoral artery.  The 6 French sheath was exchanged for a 7 French sheath in the laboratory.  The JR4 guiding catheter with sideholes was utilized and a 0.014 Hi-Torque floppy wire passed down to the distal vessel.  The artery was directly stented using a Guidant 13 mm 2.75 mm Tetra stent.  This was taken up to 15 atmospheres.  There was marked improvement in the appearance of the artery with evidence of Timi 3 flow.  The procedure was completed and the sheath was sewn into place.  HEMODYNAMIC DATA:  The central aortic pressure was 142/76.  ANGIOGRAPHIC DATA:  The right coronary artery demonstrates some luminal irregularities with an 80 to 90% ruptured plaque in the midvessel followed by two areas of tandem 30 to 40% segmental plaquing.  At the site of the ruptured plaque, this was reduced to 0% residual luminal narrowing with an excellent angiographic appearance.  The remainder of the vessel was unchanged.  CONCLUSION:  Successful percutaneous stenting of the mid right coronary artery.  DISPOSITION:  The patient must stop smoking.  If not, he will likely be a recurrent visitor to the cath lab.  Aspirin, Plavix, and appropriate preventive therapy  will be recommended.  He will also need exercise imaging for his LAD. DD:  05/05/00 TD:  05/05/00 Job: [id number]

## 2011-01-24 NOTE — Procedures (Signed)
[facility]                                EXERCISE TREADMILL   ODDIS, WESTLING                    MRN:          [id number]  DATE:05/06/2006                            DOB:          1960/12/19    PATIENT IDENTIFICATION:  Mr. Jarold Motto is a 50 year old male with a history  of coronary artery disease, status post previous RCA stenting as well as  ongoing tobacco use.  Recently has been experiencing some chest pain and is  referred for stress testing.   BASELINE DATA:  Resting EKG showed normal sinus rhythm at a rate of 67 with  no ST-T wave changes.  Resting blood pressure was 123/57.   EXERCISE DATA:  The patient walked on a standard Bruce protocol for 8  minutes 42 seconds for a total of 10.1 METs.  Peak heart rate was 134, which  was 77% of age-predicted maximum.  Peak blood pressure was 205/81.  The  patient stopped due to primarily leg fatigue.  Also experienced progressive  chest pain, which was 5/10 at peak exercise.   Stress EKG was positive for ischemia with 2 mm of ST depression, V3 through  V6, at peak exercise.  ST depressions persisted into recovery and then  finally came back to near baseline, actually back to baseline at almost 9  minutes into recovery.   INTERPRETATION:  Positive stress test suggestive of exercise-induced  ischemia.                                   Bevelyn Buckles. Bensimhon, MD   DRB/MedQ  DD:  05/06/2006  DT:  05/07/2006  Job #:  [id number]

## 2011-01-24 NOTE — Cardiovascular Report (Signed)
NAME:  Aaron, Velez             ACCOUNT NO.:  [id number]   MEDICAL RECORD NO.:  [id number]          PATIENT TYPE:  OIB   LOCATION:  1962                         FACILITY:  [facility]   PHYSICIAN:  Bevelyn Buckles. Bensimhon, MDDATE OF BIRTH:  1961-08-08   DATE OF PROCEDURE:  05/08/2006  DATE OF DISCHARGE:                              CARDIAC CATHETERIZATION   PATIENT IDENTIFICATION:  Aaron Velez is a 50 year old male with history of  hypertension, hyperlipidemia and ongoing tobacco use who had a history of  non-ST-elevation MI in August 2001 treated with PTCA and stenting of the  right coronary artery.  I am unsure of what type of stent he received.  Since that time he was doing well until about a week ago when he experienced  recurrent chest pain.  He underwent stress testing yesterday which showed  significant chest pain and ST depression with exercise.  He was thus  referred for cardiac catheterization in the outpatient laboratory.   PROCEDURES PERFORMED:  1. Selective coronary angiography.  2. Left heart catheterization.  3. Left ventriculogram.   DESCRIPTION OF THE PROCEDURE:  The risks and benefits of catheterization  were explained.  Consent was signed and placed on the chart.  Standard  catheters including JL-4, 3D RCA, and angled pigtail were used for the  procedure.  All catheter exchanges made over a wire.  There are no apparent  complications.   Central aortic pressure 108/62 with a mean 81.  LV pressure 104/0 with an  EDP of 10.  There is no aortic stenosis.   Left main was normal.   LAD was long vessel coursing the apex, gave off three small diagonals.  There is a 40% lesion in the midsection.   Left circumflex was a moderate-sized vessel, gave off a small ramus, a large  OM-1, a small OM-2, a moderate-sized OM-3, and a small OM-4.  There is a 40%  proximal lesion in the circumflex followed by 90% focal calcified lesion in  the midsection and a 40% lesion distally.   Right coronary artery was a moderate-size dominant vessel.  It gave off a  small RV branch, an acute marginal branch, a PDA and a small posterolateral.  There was a stent in the proximal portion.  There was some mild narrowing  throughout the stent with a focal 50% narrowing in the midsection of the  stent.  There were tandem distal 30% lesions.   Left ventriculogram done in the RAO position showed an EF of 60% with no  wall motion abnormalities or mitral regurgitation.  On panning down over the  abdominal aorta after the ventriculogram, there was a small infrarenal  abdominal aortic aneurysm.   ASSESSMENT:  1. Significant one-vessel coronary artery disease with high-grade lesion      in the mid left circumflex.  2. A 50% in-stent restenosis of the mid right coronary  3. Normal left ventricular function.  4. Probable small infrarenal abdominal aortic aneurysm consistent with      peripheral vascular disease.   PLAN:  1. Will plan for PCI of the left circumflex next week with Dr. Samule Ohm.  2. Will load Plavix.  3. He will need an outpatient abdominal CT to size his aneurysm.  4. Once again I encouraged him to stop smoking.  He knows to report the      emergency room should he have unstable symptoms.      Bevelyn Buckles. Bensimhon, MD  Electronically Signed     DRB/MEDQ  D:  05/08/2006  T:  05/08/2006  Job:  [id number]

## 2011-02-10 ENCOUNTER — Emergency Department (HOSPITAL_COMMUNITY)
Admission: EM | Admit: 2011-02-10 | Discharge: 2011-02-10 | Disposition: A | Discharge status: Home / Self Care | Payer: Medicare Other | Attending: Emergency Medicine | Admitting: Emergency Medicine

## 2011-02-10 DIAGNOSIS — I252 Old myocardial infarction: Secondary | ICD-10-CM | POA: Insufficient documentation

## 2011-02-10 DIAGNOSIS — E7800 Pure hypercholesterolemia, unspecified: Secondary | ICD-10-CM | POA: Insufficient documentation

## 2011-02-10 DIAGNOSIS — I10 Essential (primary) hypertension: Secondary | ICD-10-CM | POA: Insufficient documentation

## 2011-02-10 DIAGNOSIS — K089 Disorder of teeth and supporting structures, unspecified: Secondary | ICD-10-CM | POA: Insufficient documentation

## 2011-02-11 ENCOUNTER — Telehealth: Payer: Self-pay | Admitting: Internal Medicine

## 2011-02-11 NOTE — Telephone Encounter (Signed)
Pt called and has lost nitro glycerin. Could he get the spray called in instead of pills.  Please call [facility] on [address].

## 2011-02-13 ENCOUNTER — Telehealth: Payer: Self-pay | Admitting: Internal Medicine

## 2011-02-13 NOTE — Telephone Encounter (Signed)
Pt hasn't gotten med yet, needs called in doesn't have any

## 2011-02-13 NOTE — Telephone Encounter (Signed)
Will obtain recommendations from MD and notify pt and [facility] once obtained.

## 2011-02-13 NOTE — Telephone Encounter (Signed)
Pt has to have some teeth pulled and he needs to go off his plavix for 7-10 days need ok faxed to [facility] [phone]

## 2011-02-17 ENCOUNTER — Encounter: Payer: Self-pay | Admitting: Internal Medicine

## 2011-02-17 ENCOUNTER — Ambulatory Visit (INDEPENDENT_AMBULATORY_CARE_PROVIDER_SITE_OTHER): Payer: Medicare Other | Admitting: Internal Medicine

## 2011-02-17 VITALS — BP 120/68 | HR 64 | Resp 18 | Ht 69.0 in | Wt 143.1 lb

## 2011-02-17 DIAGNOSIS — F17200 Nicotine dependence, unspecified, uncomplicated: Secondary | ICD-10-CM

## 2011-02-17 DIAGNOSIS — I6529 Occlusion and stenosis of unspecified carotid artery: Secondary | ICD-10-CM

## 2011-02-17 DIAGNOSIS — E785 Hyperlipidemia, unspecified: Secondary | ICD-10-CM

## 2011-02-17 DIAGNOSIS — I2510 Atherosclerotic heart disease of native coronary artery without angina pectoris: Secondary | ICD-10-CM

## 2011-02-17 HISTORY — DX: Hyperlipidemia, unspecified: E78.5

## 2011-02-17 HISTORY — DX: Nicotine dependence, unspecified, uncomplicated: F17.200

## 2011-02-17 MED ORDER — ATORVASTATIN CALCIUM 80 MG PO TABS: 80.0000 mg | ORAL_TABLET | Freq: Every day | ORAL | Status: DC

## 2011-02-17 MED ORDER — NITROGLYCERIN 0.4 MG/SPRAY TL SOLN: 1.0000 | Status: DC | PRN

## 2011-02-17 MED ORDER — CARVEDILOL 6.25 MG PO TABS: 6.2500 mg | ORAL_TABLET | Freq: Two times a day (BID) | ORAL | Status: DC

## 2011-02-17 NOTE — Progress Notes (Signed)
PCP:  Gwendlyn Deutscher  HPI:  Aaron Velez is a very pleasant 50 year old male with a history of coronary artery disease, status post non-ST elevation myocardial infarction in May of 2008 due to restenosis of a previously placed stent.  He underwent balloon angioplasty.  Ejection fraction was 60%.  There was moderate nonobstructive disease elsewhere.  The left circumflex stent was patent.  He also has a history of hypertension, hyperlipidemia, and tobacco use.    In March 2012 had carotid u/s with R 80-99% (upper end) and L 0-39%. Underwent R CEA 3/12 by Dr. Myra Gianotti which went well.   Doing well overall. Able to do most of the things he needs to do without problem. Can walk around store without CP or SOB.   BP has been well controlled so not taking carvedilol as he was worried about low BP. Checks BPs daily at home. BP typically 120/70 at home.   Lipids in April: TC 199 TG 124 HDL 41 LDL 133. Taking pravastatin regularly.   Still smoking 0.5 - 1 ppd.  No claudication. Called 1-800-QUIT-NOW and they are sending him patches to start next week.   Pending teeth extraction.    ROS: All systems negative except as listed in HPI, PMH and Problem List.  Past Medical History  Diagnosis Date  . CAD (coronary artery disease)   . HTN (hypertension)   . HLD (hyperlipidemia)     Current Outpatient Prescriptions  Medication Sig Dispense Refill  . aspirin 81 MG tablet Take 81 mg by mouth daily.        . clopidogrel (PLAVIX) 75 MG tablet Take 75 mg by mouth daily.        Marland Kitchen escitalopram (LEXAPRO) 10 MG tablet Take 10 mg by mouth daily.        . Multiple Vitamin (MULTIVITAMIN) tablet Take 1 tablet by mouth daily.        . nitroGLYCERIN (NITROLINGUAL) 0.4 MG/SPRAY spray Place 1 spray under the tongue every 5 (five) minutes as needed.  12 g  2  . pravastatin (PRAVACHOL) 40 MG tablet Take 40 mg by mouth daily.        Marland Kitchen DISCONTD: ALPRAZolam (XANAX) 0.25 MG tablet Take 0.25 mg by mouth every 6 (six) hours as  needed.        . carvedilol (COREG) 25 MG tablet Take 25 mg by mouth 2 (two) times daily with a meal.        . DISCONTD: meloxicam (MOBIC) 15 MG tablet Take 15 mg by mouth daily.        Marland Kitchen DISCONTD: nitroGLYCERIN (NITROLINGUAL) 0.4 MG/SPRAY spray Place 1 spray under the tongue every 5 (five) minutes as needed.        Marland Kitchen DISCONTD: nitroGLYCERIN (NITROSTAT) 0.4 MG SL tablet Place 0.4 mg under the tongue every 5 (five) minutes as needed.           PHYSICAL EXAM: Filed Vitals:   02/17/11 1609  BP: 120/68  Pulse: 64  Resp: 18   General:  Well appearing. No resp difficulty HEENT: normal except for poor dentition Neck: supple. JVP flat. Carotids 2+ bilaterally; + bilateral bruits. Well healed R CEA scar  No lymphadenopathy or thryomegaly appreciated. Cor: PMI normal. Regular rate & rhythm. No rubs, gallops. Soft SEM LSB. Lungs: clear with mildly decrease BS throughout Abdomen: soft, nontender, nondistended. No hepatosplenomegaly. No bruits or masses. Good bowel sounds. Extremities: no cyanosis, clubbing, rash, edema Neuro: alert & orientedx3, cranial nerves grossly intact. Moves all 4  extremities w/o difficulty. Affect pleasant.    ECG: SR 65 No ST-T wave abnormalities.     ASSESSMENT & PLAN:

## 2011-02-17 NOTE — Patient Instructions (Signed)
Your physician wants you to follow-up in: 6 months with Dr. Gala Romney You will receive a reminder letter in the mail two months in advance. If you don't receive a letter, please call our office to schedule the follow-up appointment.  Your physician has recommended you make the following change in your medication: STOP PRAVASTATIN AND START ATORVASTATIN 80 MG 1 TAB DAILY. DECREASE COREG 6.25 MG 1 TAB TWICE DAILY

## 2011-02-17 NOTE — Assessment & Plan Note (Signed)
Reinforced need to quit. Encouraged use of [facility].

## 2011-02-17 NOTE — Telephone Encounter (Signed)
Addressed at [facility] 6/11

## 2011-02-17 NOTE — Assessment & Plan Note (Signed)
S/p CEA on R. Left side is non-obstructive. Needs aggressive statin therapy and smoking cessation.

## 2011-02-17 NOTE — Assessment & Plan Note (Addendum)
No evidence of ischemia. Long talk about plusses and minuses of beta-blocker therapy in setting of CAD. Given low BP and HR. Will try to reinitiate carvedilol at 6.25 bid and see how he does. Ok to stop Plavix for a week for dental surgery.

## 2011-02-17 NOTE — Assessment & Plan Note (Signed)
Need to get LDL down to < 70. Switched prava 40 to atorva 80.

## 2011-02-26 ENCOUNTER — Other Ambulatory Visit: Payer: Self-pay | Admitting: *Deleted

## 2011-02-26 DIAGNOSIS — I2510 Atherosclerotic heart disease of native coronary artery without angina pectoris: Secondary | ICD-10-CM

## 2011-02-26 MED ORDER — NITROGLYCERIN 0.4 MG SL SUBL: 0.4000 mg | SUBLINGUAL_TABLET | SUBLINGUAL | Status: DC | PRN

## 2011-06-09 LAB — POCT CARDIAC MARKERS
CKMB, poc: 1.1
Myoglobin, poc: 54.8
Troponin i, poc: <0.05

## 2011-06-16 ENCOUNTER — Other Ambulatory Visit: Payer: Self-pay | Admitting: Surgery

## 2011-06-16 ENCOUNTER — Other Ambulatory Visit (INDEPENDENT_AMBULATORY_CARE_PROVIDER_SITE_OTHER): Payer: Medicare Other | Admitting: *Deleted

## 2011-06-16 ENCOUNTER — Other Ambulatory Visit: Payer: Self-pay | Admitting: *Deleted

## 2011-06-16 DIAGNOSIS — I6529 Occlusion and stenosis of unspecified carotid artery: Secondary | ICD-10-CM

## 2011-06-16 DIAGNOSIS — Z48812 Encounter for surgical aftercare following surgery on the circulatory system: Secondary | ICD-10-CM

## 2011-06-16 LAB — CREATININE, SERUM: Creat: 0.73 mg/dL (ref 0.50–1.35)

## 2011-06-16 LAB — BUN: BUN: 9 mg/dL (ref 6–23)

## 2011-06-17 LAB — I-STAT 8, (EC8 V) (CONVERTED LAB)
BUN: 11
Chloride: 104
HCT: 43
Hemoglobin: 14.6
Operator id: 257131
Sodium: 140
pCO2, Ven: 50.6 — ABNORMAL HIGH

## 2011-06-17 LAB — TROPONIN I: Troponin I: 0.01

## 2011-06-17 LAB — COMPREHENSIVE METABOLIC PANEL
ALT: 21
AST: 24
Calcium: 9.1
Creatinine, Ser: 0.72
GFR calc Af Amer: >60
Glucose, Bld: 144 — ABNORMAL HIGH
Sodium: 138
Total Protein: 6.4

## 2011-06-17 LAB — DIFFERENTIAL
Basophils Relative: 1
Lymphocytes Relative: 23
Lymphs Abs: 1.7
Monocytes Absolute: 0.6
Monocytes Relative: 7
Neutro Abs: 6.6
Neutrophils Relative %: 67

## 2011-06-17 LAB — CK TOTAL AND CKMB (NOT AT ARMC)
CK, MB: 1.6
Relative Index: 1.4
Total CK: 116

## 2011-06-17 LAB — PROTIME-INR
INR: 1
Prothrombin Time: 13.3

## 2011-06-17 LAB — LIPID PANEL
Cholesterol: 127
HDL: 31 — ABNORMAL LOW

## 2011-06-17 LAB — CBC
HCT: 40
Hemoglobin: 13.6
MCHC: 34
Platelets: 239
RBC: 4.65
WBC: 7.2

## 2011-06-17 LAB — CARDIAC PANEL(CRET KIN+CKTOT+MB+TROPI)
CK, MB: 1.3
CK, MB: 1.5
Relative Index: 1.4
Relative Index: INVALID
Total CK: 98
Troponin I: 0.03

## 2011-06-17 LAB — POCT CARDIAC MARKERS
Myoglobin, poc: 82.8
Operator id: 257131

## 2011-06-23 ENCOUNTER — Encounter: Payer: Self-pay | Admitting: Surgery

## 2011-06-30 ENCOUNTER — Ambulatory Visit
Admission: RE | Admit: 2011-06-30 | Discharge: 2011-06-30 | Disposition: A | Discharge status: Home / Self Care | Payer: Medicare Other | Source: Ambulatory Visit | Attending: Surgery | Admitting: Surgery

## 2011-06-30 ENCOUNTER — Encounter: Payer: Self-pay | Admitting: Surgery

## 2011-06-30 ENCOUNTER — Ambulatory Visit (INDEPENDENT_AMBULATORY_CARE_PROVIDER_SITE_OTHER): Payer: Medicare Other | Admitting: Surgery

## 2011-06-30 VITALS — BP 102/68 | HR 70 | Ht 70.0 in | Wt 148.0 lb

## 2011-06-30 DIAGNOSIS — I6529 Occlusion and stenosis of unspecified carotid artery: Secondary | ICD-10-CM

## 2011-06-30 MED ORDER — IOHEXOL 350 MG/ML SOLN
100.0000 mL | Freq: Once | INTRAVENOUS | Status: AC | PRN
  Administered 2011-06-30: 100 mL via INTRAVENOUS

## 2011-06-30 NOTE — Progress Notes (Signed)
The patient comes back in today for followup. He underwent a carotid duplex last week which showed elevated velocities within the distal common carotid artery just proximal to the endarterectomy site. Ultrasound could not quantitate the degree of stenosis and therefore I felt he needed an additional diagnostic evaluation. He was therefore sent for a CT angiogram. The patient is status post right carotid endarterectomy approximately 6 months ago he continues to be asymptomatic. He denies numbness weakness, slurred speech, amaurosis fugax.  I have gone over his CT scan both myself and with radiology, Dr. Lacy Duverney. There is approximately a 50% stenosis in the right common carotid artery just proximal to the endarterectomy site.  Based on the degree of stenosis I do not feel any intervention is warranted at this time. If this were to progressed to greater than 80%, or if the patient became symptomatic, I would consider proceeding with carotid stenting. However, at this point in time I think observation is all that is required. He'll followup in 6 months with a carotid ultrasound.  I spent approximately 45 minutes reviewing the patient's chart discussing this with the patient and evaluating his imaging studies

## 2011-07-01 NOTE — Procedures (Unsigned)
CAROTID DUPLEX EXAM  INDICATION:  Carotid endarterectomy.  HISTORY: Diabetes:  No. Cardiac:  No. Hypertension:  Yes. Smoking:  No. Previous Surgery:  Right carotid endarterectomy on 12/05/2010. CV History:  Currently asymptomatic. Amaurosis Fugax No, Paresthesias No, Hemiparesis No.                                      RIGHT             LEFT Brachial systolic pressure: Brachial Doppler waveforms: Vertebral direction of flow:        Antegrade         Antegrade DUPLEX VELOCITIES (cm/sec) CCA peak systolic                   M = 73/D = 251    P = 244/M = 83 ECA peak systolic                   109               42 ICA peak systolic                   104               74 ICA end diastolic                   48                37 PLAQUE MORPHOLOGY:                  Heterogenous      Mixed PLAQUE AMOUNT:                      Moderate          Moderate PLAQUE LOCATION:                    Mid CCA           ICA/ECA/proximal CCA  IMPRESSION: 1. Patent right carotid endarterectomy site with no right internal     carotid artery stenosis.  There is a significant stenosis noted in     the right mid to distal common carotid artery just proximal to the     endarterectomy site. 2. Doppler velocities suggest no hemodynamically significant stenosis     of the left internal carotid artery.  Elevated velocity of 244 cm/s     noted in the left proximal common carotid artery. 3. Additional testing of the carotid arteries was scheduled at my     request.  ___________________________________________ V. Charlena Cross, MD  CH/MEDQ  D:  06/18/2011  T:  06/18/2011  Job:  [id number]

## 2011-07-09 ENCOUNTER — Other Ambulatory Visit: Payer: Self-pay | Admitting: Dermatology

## 2011-11-12 ENCOUNTER — Ambulatory Visit (INDEPENDENT_AMBULATORY_CARE_PROVIDER_SITE_OTHER): Payer: Medicare Other | Admitting: Cardiology

## 2011-11-12 ENCOUNTER — Encounter: Payer: Self-pay | Admitting: Cardiology

## 2011-11-12 VITALS — BP 114/69 | HR 66 | Ht 68.0 in | Wt 146.0 lb

## 2011-11-12 DIAGNOSIS — F17200 Nicotine dependence, unspecified, uncomplicated: Secondary | ICD-10-CM

## 2011-11-12 DIAGNOSIS — I2510 Atherosclerotic heart disease of native coronary artery without angina pectoris: Secondary | ICD-10-CM

## 2011-11-12 DIAGNOSIS — I6529 Occlusion and stenosis of unspecified carotid artery: Secondary | ICD-10-CM

## 2011-11-12 DIAGNOSIS — E785 Hyperlipidemia, unspecified: Secondary | ICD-10-CM

## 2011-11-12 LAB — BASIC METABOLIC PANEL
BUN: 10 mg/dL (ref 6–23)
Calcium: 9.6 mg/dL (ref 8.4–10.5)
Creatinine, Ser: 0.9 mg/dL (ref 0.4–1.5)
GFR: 91.59 mL/min (ref >60.00)
Glucose, Bld: 85 mg/dL (ref 70–99)

## 2011-11-12 LAB — HEPATIC FUNCTION PANEL
ALT: 24 U/L (ref 0–53)
AST: 22 U/L (ref 0–37)
Bilirubin, Direct: 0 mg/dL (ref 0.0–0.3)
Total Bilirubin: 0.3 mg/dL (ref 0.3–1.2)

## 2011-11-12 MED ORDER — NITROGLYCERIN 0.4 MG SL SUBL: 0.4000 mg | SUBLINGUAL_TABLET | SUBLINGUAL | Status: DC | PRN

## 2011-11-12 MED ORDER — CLOPIDOGREL BISULFATE 75 MG PO TABS: 75.0000 mg | ORAL_TABLET | Freq: Every day | ORAL | Status: DC

## 2011-11-12 NOTE — Patient Instructions (Signed)
Your physician wants you to follow-up in: one year.   You will receive a reminder letter in the mail two months in advance. If you don't receive a letter, please call our office to schedule the follow-up appointment.  Your physician recommends that you return for lab work in: today (liver, lipid, bmet)

## 2011-11-12 NOTE — Assessment & Plan Note (Signed)
Stable with no ischemic symptoms.  Continue ASA 81, statin, Coreg, Plavix.  I will likely continue Plavix long-term due to his episode of stent thrombosis.

## 2011-11-12 NOTE — Assessment & Plan Note (Signed)
Bilateral bruits.  Has had right CEA.  Sees Dr. Myra Gianotti at [facility] tomorrow.

## 2011-11-12 NOTE — Progress Notes (Signed)
PCP: Dr. Knox Royalty  51 yo with history of CAD and smoking presents for cardiology followup.  He has been seen by Dr. Gala Romney in the past and is seen by me for the first time today.  He had BMS to the RCA in 2001, BMS to the CFX in 2007, then had stent thrombosis in the RCA stent in 5/08 with inferior STEMI.  Since then, he has done well symptomatically.  He is compliant with his medications.  No exertional chest pain and no nitroglycerin use.  No exertional dyspnea.  He walks his dog daily.  He continues to smoke 1 ppd.  No claudication symptoms, no stroke-like symptoms.    ECG: NSR, normal  Labs (10/12): creatinine 0.73  PMH: 1. CAD: BMS to RCA in 2001.  Unstable angina with BMS to CFX in 8/07.  Inferior MI in 5/08 due to stent thrombosis in RCA, had PTCA.  EF 60% with inferior hypokinesis on 5/08 cath.  2. Erb's palsy 3. HTN 4. Anxiety 5. Carotid stenosis: right CEA in 3/12.  6. Hyperlipidemia 7. Active smoker: Failed Chantix and Wellbutrin.  8. Rotator cuff strain/tear   SH: Married, lives in [place].  Smokes 1 ppd.  On disability.   FH: CAD  ROS: All systems reviewed and negative except as per HPI.   Current Outpatient Prescriptions  Medication Sig Dispense Refill  . aspirin 81 MG tablet Take 81 mg by mouth daily.        Marland Kitchen atorvastatin (LIPITOR) 80 MG tablet Take 1 tablet (80 mg total) by mouth daily.  30 tablet  11  . carvedilol (COREG) 6.25 MG tablet Take 1 tablet (6.25 mg total) by mouth 2 (two) times daily.  60 tablet  11  . clopidogrel (PLAVIX) 75 MG tablet Take 1 tablet (75 mg total) by mouth daily.  30 tablet  6  . LORazepam (ATIVAN) 0.5 MG tablet Take 0.5 mg by mouth as needed.        . Multiple Vitamin (MULTIVITAMIN) tablet Take 1 tablet by mouth daily.        . nitroGLYCERIN (NITROSTAT) 0.4 MG SL tablet Place 1 tablet (0.4 mg total) under the tongue every 5 (five) minutes as needed for chest pain.  25 tablet  11  . DISCONTD: nitroGLYCERIN (NITROSTAT) 0.4 MG SL  tablet Place 1 tablet (0.4 mg total) under the tongue every 5 (five) minutes as needed for chest pain.  100 tablet  3  . loratadine (CLARITIN) 10 MG tablet Take 10 mg by mouth daily.          BP 114/69  Pulse 66  Ht 5\' 8"  (1.727 m)  Wt 146 lb (66.225 kg)  BMI 22.20 kg/m2 General: NAD, thin Neck: No JVD, no thyromegaly or thyroid nodule.  Lungs: Mildly decreased breath sounds throughout.  CV: Nondisplaced PMI.  Heart regular S1/S2, no S3/S4, no murmur.  No peripheral edema.  Bilateral carotid bruits.  Normal pedal pulses.  Abdomen: Soft, nontender, no hepatosplenomegaly, no distention.  Neurologic: Alert and oriented x 3.  Psych: Normal affect. Extremities: No clubbing or cyanosis.

## 2011-11-12 NOTE — Assessment & Plan Note (Signed)
Check lipids today with goal LDL < 70.  

## 2011-11-12 NOTE — Assessment & Plan Note (Signed)
I strongly encouraged him to quit smoking.  He is going to try nicotine patches.

## 2011-11-21 ENCOUNTER — Other Ambulatory Visit: Payer: Self-pay | Admitting: *Deleted

## 2011-11-21 ENCOUNTER — Telehealth: Payer: Self-pay | Admitting: Cardiology

## 2011-11-21 MED ORDER — ROSUVASTATIN CALCIUM 40 MG PO TABS: 40.0000 mg | ORAL_TABLET | Freq: Every day | ORAL | Status: DC

## 2011-11-21 NOTE — Telephone Encounter (Signed)
Left message to call back  

## 2011-11-21 NOTE — Telephone Encounter (Signed)
Fu call °Patient returning your call °

## 2011-11-21 NOTE — Telephone Encounter (Signed)
FU Call: Pt returning call to Pat. Please return pt call to discuss further.  

## 2011-11-21 NOTE — Telephone Encounter (Signed)
Pt notified of labs.  Instructed to D/C Lipitor and start Crestor 40mg  daily and have L/L redrawn in 2 months.  Pt agrees and Crestor called to [facility] on [address].

## 2011-12-25 ENCOUNTER — Other Ambulatory Visit: Payer: Self-pay | Admitting: *Deleted

## 2011-12-25 DIAGNOSIS — I6529 Occlusion and stenosis of unspecified carotid artery: Secondary | ICD-10-CM

## 2011-12-30 ENCOUNTER — Encounter: Payer: Self-pay | Admitting: Neurosurgery

## 2011-12-31 ENCOUNTER — Other Ambulatory Visit (INDEPENDENT_AMBULATORY_CARE_PROVIDER_SITE_OTHER): Payer: Medicare Other | Admitting: *Deleted

## 2011-12-31 ENCOUNTER — Encounter: Payer: Self-pay | Admitting: Neurosurgery

## 2011-12-31 ENCOUNTER — Ambulatory Visit (INDEPENDENT_AMBULATORY_CARE_PROVIDER_SITE_OTHER): Payer: Medicare Other | Admitting: Neurosurgery

## 2011-12-31 VITALS — BP 123/76 | HR 63 | Resp 16 | Ht 70.0 in | Wt 140.0 lb

## 2011-12-31 DIAGNOSIS — I6529 Occlusion and stenosis of unspecified carotid artery: Secondary | ICD-10-CM

## 2011-12-31 DIAGNOSIS — Z48812 Encounter for surgical aftercare following surgery on the circulatory system: Secondary | ICD-10-CM

## 2011-12-31 NOTE — Progress Notes (Signed)
[facility] [place] HISTORY AND PHYSICAL   CC: Six-month surveillance for known carotid stenosis Referring Physician: Brabham  History of Present Illness: 51 year old male patient of Dr. Myra Gianotti that is 13 months status post a right carotid endarterectomy. The patient denies signs or symptoms of CVA, TIA, diplopia, dysphagia, amaurosis fugax or word finding difficulties. The patient reports no new medical problems or new surgery since his last appointment here.  Past Medical History  Diagnosis Date  . CAD (coronary artery disease)   . HTN (hypertension)   . HLD (hyperlipidemia)   . Myocardial infarction 2001 & 2008  . Leg pain   . Chest pain   . Shortness of breath   . Carotid artery occlusion     ROS: [x]  Positive   [ ]  Denies    General: [ ]  Weight loss, [ ]  Fever, [ ]  chills Neurologic: [ ]  Dizziness, [ ]  Blackouts, [ ]  Seizure [ ]  Stroke, [ ]  "Mini stroke", [ ]  Slurred speech, [ ]  Temporary blindness; [ ]  weakness in arms or legs, [ ]  Hoarseness Cardiac: [ ]  Chest pain/pressure, [ ]  Shortness of breath at rest [ ]  Shortness of breath with exertion, [ ]  Atrial fibrillation or irregular heartbeat Vascular: [ ]  Pain in legs with walking, [ ]  Pain in legs at rest, [ ]  Pain in legs at night,  [ ]  Non-healing ulcer, [ ]  Blood clot in vein/DVT,   Pulmonary: [ ]  Home oxygen, [ ]  Productive cough, [ ]  Coughing up blood, [ ]  Asthma,  [ ]  Wheezing Musculoskeletal:  [ ]  Arthritis, [ ]  Low back pain, [ ]  Joint pain Hematologic: [ ]  Easy Bruising, [ ]  Anemia; [ ]  Hepatitis Gastrointestinal: [ ]  Blood in stool, [ ]  Gastroesophageal Reflux/heartburn, [ ]  Trouble swallowing Urinary: [ ]  chronic Kidney disease, [ ]  on HD - [ ]  MWF or [ ]  TTHS, [ ]  Burning with urination, [ ]  Difficulty urinating Skin: [ ]  Rashes, [ ]  Wounds Psychological: [ ]  Anxiety, [ ]  Depression   Social History History  Substance Use Topics  . Smoking status: Current Everyday Smoker -- 1.0  packs/day    Types: Cigarettes  . Smokeless tobacco: Not on file  . Alcohol Use: No    Family History Family History  Problem Relation Age of Onset  . Hypertension    . Coronary artery disease      Allergies  Allergen Reactions  . Penicillins Rash    Burning pain and rash    Current Outpatient Prescriptions  Medication Sig Dispense Refill  . aspirin 81 MG tablet Take 81 mg by mouth daily.        . carvedilol (COREG) 6.25 MG tablet Take 1 tablet (6.25 mg total) by mouth 2 (two) times daily.  60 tablet  11  . clopidogrel (PLAVIX) 75 MG tablet Take 1 tablet (75 mg total) by mouth daily.  30 tablet  6  . escitalopram (LEXAPRO) 10 MG tablet Take 10 mg by mouth daily.      . Multiple Vitamin (MULTIVITAMIN) tablet Take 1 tablet by mouth daily.        . nitroGLYCERIN (NITROSTAT) 0.4 MG SL tablet Place 1 tablet (0.4 mg total) under the tongue every 5 (five) minutes as needed for chest pain.  25 tablet  11  . pravastatin (PRAVACHOL) 40 MG tablet Take 40 mg by mouth daily.      Marland Kitchen loratadine (CLARITIN) 10 MG tablet Take 10 mg by mouth daily.        Marland Kitchen  LORazepam (ATIVAN) 0.5 MG tablet Take 0.5 mg by mouth as needed.        . rosuvastatin (CRESTOR) 40 MG tablet Take 1 tablet (40 mg total) by mouth at bedtime.  30 tablet  11    Physical Examination  Filed Vitals:   12/31/11 1511  BP: 123/76  Pulse: 63  Resp: 16    Body mass index is 20.09 kg/(m^2).  General:  WDWN in NAD Gait: Normal HEENT: WNL Eyes: Pupils equal Pulmonary: normal non-labored breathing , without Rales, rhonchi,  wheezing Cardiac: RRR, without  Murmurs, rubs or gallops; Abdomen: soft, NT, no masses Skin: no rashes, ulcers noted  Vascular Exam Pulses: 2+ radial pulses bilaterally Carotid bruits: Patient has significant carotid bruits to auscultation bilaterally Extremities without ischemic changes, no Gangrene , no cellulitis; no open wounds;  Musculoskeletal: no muscle wasting or atrophy   Neurologic: A&O X  3; Appropriate Affect ; SENSATION: normal; MOTOR FUNCTION:  moving all extremities equally. Speech is fluent/normal  Non-Invasive Vascular Imaging CAROTID DUPLEX 12/31/2011  Right ICA 40 - 59 % stenosis Left ICA 0 - 19% stenosis   ASSESSMENT/PLAN: Assessment as above, given the fact the patient has a 40-59% stenosis on the right where the CEA was performed just over a year ago we will bring him back in 6 months to repeat the carotid duplex and be seen by Dr. Myra Gianotti. Patient and his wife are in agreement with this, their questions were encouraged and answered.  Lauree Chandler ANP   Clinic MD: Edilia Bo

## 2012-01-01 NOTE — Progress Notes (Signed)
Addended by: Sharee Pimple on: 01/01/2012 02:18 PM   Modules accepted: Orders

## 2012-01-07 NOTE — Procedures (Unsigned)
CAROTID DUPLEX EXAM  INDICATION:  Carotid disease  HISTORY: Diabetes:  No Cardiac:  No Hypertension:  Yes Smoking:  No Previous Surgery:  Right carotid endarterectomy on 12/05/2010 CV History:  Currently asymptomatic Amaurosis Fugax No, Paresthesias No, Hemiparesis No                                      RIGHT             LEFT Brachial systolic pressure:         104               110 Brachial Doppler waveforms:         Normal            Normal Vertebral direction of flow:        Not visualized    Antegrade DUPLEX VELOCITIES (cm/sec) CCA peak systolic                   M=90/D=395        P=371/M=97 ECA peak systolic                   132               65 ICA peak systolic                   136               78 ICA end diastolic                   59                38 PLAQUE MORPHOLOGY:                  Heterogenous      Heterogenous PLAQUE AMOUNT:                      Moderate          Moderate PLAQUE LOCATION:                    Proximal and distal CCA             ICA/CCA  IMPRESSION:  Patent right carotid endarterectomy site.  Doppler velocities suggest 40% to 59% stenosis of the right internal carotid artery, however this increased velocity appears to be due to change in vessel diameter since no plaque was visualized. No hemodynamically significant stenosis of the left internal carotid artery with plaque formations as described above. Elevated velocities noted in the right mid to distal common carotid artery and left proximal common carotid arteries. Increase in velocities of the right proximal internal carotid artery, right mid to distal common carotid artery, and left proximal common carotid artery noted when compared to the previous exam on 06/16/2011  ___________________________________________ V. Charlena Cross, MD  CH/MEDQ  D:  01/02/2012  T:  01/02/2012  Job:  [id number]

## 2012-03-31 ENCOUNTER — Other Ambulatory Visit: Payer: Self-pay | Admitting: Cardiology

## 2012-03-31 DIAGNOSIS — I2510 Atherosclerotic heart disease of native coronary artery without angina pectoris: Secondary | ICD-10-CM

## 2012-03-31 MED ORDER — CARVEDILOL 6.25 MG PO TABS: 6.2500 mg | ORAL_TABLET | Freq: Two times a day (BID) | ORAL | Status: DC

## 2012-03-31 MED ORDER — PRAVASTATIN SODIUM 40 MG PO TABS: 40.0000 mg | ORAL_TABLET | Freq: Every day | ORAL | Status: DC

## 2012-03-31 NOTE — Telephone Encounter (Signed)
Pt is out of meds

## 2012-06-07 ENCOUNTER — Encounter: Payer: Self-pay | Admitting: Cardiology

## 2012-06-07 ENCOUNTER — Ambulatory Visit (INDEPENDENT_AMBULATORY_CARE_PROVIDER_SITE_OTHER): Payer: Medicare Other | Admitting: Cardiology

## 2012-06-07 VITALS — BP 118/70 | HR 64 | Ht 69.0 in | Wt 137.1 lb

## 2012-06-07 DIAGNOSIS — R0989 Other specified symptoms and signs involving the circulatory and respiratory systems: Secondary | ICD-10-CM

## 2012-06-07 DIAGNOSIS — E785 Hyperlipidemia, unspecified: Secondary | ICD-10-CM

## 2012-06-07 DIAGNOSIS — F17200 Nicotine dependence, unspecified, uncomplicated: Secondary | ICD-10-CM

## 2012-06-07 DIAGNOSIS — I2510 Atherosclerotic heart disease of native coronary artery without angina pectoris: Secondary | ICD-10-CM

## 2012-06-07 MED ORDER — LORAZEPAM 0.5 MG PO TABS: 0.5000 mg | ORAL_TABLET | ORAL | Status: DC | PRN

## 2012-06-07 MED ORDER — ATORVASTATIN CALCIUM 20 MG PO TABS: 20.0000 mg | ORAL_TABLET | Freq: Every day | ORAL | Status: DC

## 2012-06-07 NOTE — Patient Instructions (Addendum)
Stop pravastatin.   Start atorvastatin 20mg  daily.  Dr Shirlee Latch has refilled ativan for you for 1 month. He will not be able to provide refills for you for this.   Your physician recommends that you return for a FASTING lipid profile /liver profile in 2 months.  This is scheduled for Friday November 29,2013. The lab opens at 8:30am. Do not eat or drink after midnight.   Your physician wants you to follow-up in: 6 months with Dr Shirlee Latch.  You will receive a reminder letter in the mail two months in advance. If you don't receive a letter, please call our office to schedule the follow-up appointment.

## 2012-06-07 NOTE — Progress Notes (Signed)
Patient ID: Aaron Velez, male   DOB: February 17, 1961, 51 y.o.   MRN: [id number] PCP: Dr. Knox Royalty  51 yo with history of CAD and smoking presents for cardiology followup.  He had BMS to the RCA in 2001, BMS to the CFX in 2007, then had stent thrombosis in the RCA stent in 5/08 with inferior STEMI.  Since then, he has done well symptomatically.  He is compliant with his medications.  No exertional chest pain and no nitroglycerin use.  No exertional dyspnea.  He walks his dog daily.  He continues to smoke 1 ppd but has purchased nicotine patches (not using yet).  No claudication symptoms, no stroke-like symptoms.    Labs (10/12): creatinine 0.73 Labs (3/13): K 4.2, creatinine 0.9, LDL 115, HDL 37  ECG: NSR, normal  PMH: 1. CAD: BMS to RCA in 2001.  Unstable angina with BMS to CFX in 8/07.  Inferior MI in 5/08 due to stent thrombosis in RCA, had PTCA.  EF 60% with inferior hypokinesis on 5/08 cath.  2. Erb's palsy 3. HTN 4. Anxiety 5. Carotid stenosis: right CEA in 3/12.  Carotid dopplers (4/13) with 40-59% RICA stenosis. 6. Hyperlipidemia 7. Active smoker: Failed Chantix and Wellbutrin.  8. Rotator cuff strain/tear   SH: Married, lives in [place].  Smokes 1 ppd.  On disability.   FH: CAD  ROS: All systems reviewed and negative except as per HPI.   Current Outpatient Prescriptions  Medication Sig Dispense Refill  . aspirin 81 MG tablet Take 81 mg by mouth daily.        . carvedilol (COREG) 6.25 MG tablet Take 1 tablet (6.25 mg total) by mouth 2 (two) times daily.  180 tablet  3  . clopidogrel (PLAVIX) 75 MG tablet Take 1 tablet (75 mg total) by mouth daily.  30 tablet  6  . LORazepam (ATIVAN) 0.5 MG tablet Take 1 tablet (0.5 mg total) by mouth as needed. Dr Shirlee Latch will not be able to provide additional refills  30 tablet  0  . Multiple Vitamin (MULTIVITAMIN) tablet Take 1 tablet by mouth daily.        . nitroGLYCERIN (NITROSTAT) 0.4 MG SL tablet Place 1 tablet (0.4 mg total) under  the tongue every 5 (five) minutes as needed for chest pain.  25 tablet  11  . atorvastatin (LIPITOR) 20 MG tablet Take 1 tablet (20 mg total) by mouth daily.  30 tablet  2    BP 118/70  Pulse 64  Ht 5\' 9"  (1.753 m)  Wt 137 lb 1.9 oz (62.197 kg)  BMI 20.25 kg/m2  SpO2 96% General: NAD, thin Neck: No JVD, no thyromegaly or thyroid nodule.  Lungs: Mildly decreased breath sounds throughout.  CV: Nondisplaced PMI.  Heart regular S1/S2, no S3/S4, 1/6 SEM.  No peripheral edema.  Bilateral carotid bruits.  Normal pedal pulses.  Abdomen: Soft, nontender, no hepatosplenomegaly, no distention.  Neurologic: Alert and oriented x 3.  Psych: Normal affect. Extremities: No clubbing or cyanosis.   Assessment/Plan: 1. CAD: Stable with no ischemic symptoms.  Continue ASA, statin, Coreg.  I will have him continue Plavix given history of stent thrombosis.  2. Carotid stenosis: Followed at [facility].  3. Hyperlipidemia: Goal LDL < 70.  Stop pravastatin and treat with atorvastatin 20 mg daily.  Lipids/LFTs in 2 months.  4. Smoking: Still smoking 1 ppd.  I encouraged him to try the nicotine patches that he has bought.   [name] 06/07/2012

## 2012-06-25 ENCOUNTER — Encounter: Payer: Self-pay | Admitting: Surgery

## 2012-06-28 ENCOUNTER — Other Ambulatory Visit (INDEPENDENT_AMBULATORY_CARE_PROVIDER_SITE_OTHER): Payer: Medicare Other | Admitting: *Deleted

## 2012-06-28 ENCOUNTER — Ambulatory Visit (INDEPENDENT_AMBULATORY_CARE_PROVIDER_SITE_OTHER): Payer: Medicare Other | Admitting: Surgery

## 2012-06-28 ENCOUNTER — Encounter: Payer: Self-pay | Admitting: Surgery

## 2012-06-28 VITALS — BP 116/66 | HR 59 | Ht 69.0 in | Wt 138.0 lb

## 2012-06-28 DIAGNOSIS — I6529 Occlusion and stenosis of unspecified carotid artery: Secondary | ICD-10-CM

## 2012-06-28 DIAGNOSIS — Z48812 Encounter for surgical aftercare following surgery on the circulatory system: Secondary | ICD-10-CM

## 2012-06-28 NOTE — Progress Notes (Signed)
[facility] [place]   Patient name: Aaron Velez MRN: [id number] DOB: 1960-12-14 Sex: male     Chief Complaint  Patient presents with  . Carotid    6 month f/u - rt CEA 12/05/2011    HISTORY OF PRESENT ILLNESS: The patient is back today for followup of his carotid occlusive disease. He is status post right carotid endarterectomy on 12/05/2010 for asymptomatic stenosis. Operative findings included 85% stenosis. The patient's postoperative course was uneventful. He had a slight marginal mandibular neurapraxia which resolved. We have been following him for a stenosis within the proximal portion of his patch. He remains asymptomatic. Specifically, he denies numbness or weakness in either extremity. He denies slurred speech. He denies amaurosis fugax. He continues to be medically managed for his hypertension and hyperlipidemia. He denies chest pain. He is status post MI in 2001 at 2008. He is on dual antiplatelet therapy. This includes aspirin and Plavix.  Past Medical History  Diagnosis Date  . CAD (coronary artery disease)   . HTN (hypertension)   . HLD (hyperlipidemia)   . Myocardial infarction 2001 & 2008  . Leg pain   . Chest pain   . Shortness of breath   . Carotid artery occlusion     Past Surgical History  Procedure Date  . Carotid endarterectomy 12/05/2010    right CEA  . Coronary angioplasty with stent placement 2001,2007,2008  . Finger surgery     For brown recluse spider bite    History   Social History  . Marital Status: Married    Spouse Name: N/A    Number of Children: 4  . Years of Education: N/A   Occupational History  . Not on file.   Social History Main Topics  . Smoking status: Current Every Day Smoker -- 1.0 packs/day    Types: Cigarettes  . Smokeless tobacco: Former Neurosurgeon    Types: Chew  . Alcohol Use: No  . Drug Use: No  . Sexually Active: Not on file   Other Topics Concern  . Not on file   Social History Narrative    . No narrative on file    Family History  Problem Relation Age of Onset  . Hypertension    . Coronary artery disease      Allergies as of 06/28/2012 - Review Complete 06/28/2012  Allergen Reaction Noted  . Penicillins Rash 06/30/2011    Current Outpatient Prescriptions on File Prior to Visit  Medication Sig Dispense Refill  . aspirin 81 MG tablet Take 81 mg by mouth daily.        Marland Kitchen atorvastatin (LIPITOR) 20 MG tablet Take 1 tablet (20 mg total) by mouth daily.  30 tablet  2  . carvedilol (COREG) 6.25 MG tablet Take 1 tablet (6.25 mg total) by mouth 2 (two) times daily.  180 tablet  3  . clopidogrel (PLAVIX) 75 MG tablet Take 1 tablet (75 mg total) by mouth daily.  30 tablet  6  . LORazepam (ATIVAN) 0.5 MG tablet Take 1 tablet (0.5 mg total) by mouth as needed. Dr Shirlee Latch will not be able to provide additional refills  30 tablet  0  . Multiple Vitamin (MULTIVITAMIN) tablet Take 1 tablet by mouth daily.        . nitroGLYCERIN (NITROSTAT) 0.4 MG SL tablet Place 1 tablet (0.4 mg total) under the tongue every 5 (five) minutes as needed for chest pain.  25 tablet  11     REVIEW OF  SYSTEMS: No changes since prior visit  PHYSICAL EXAMINATION:   Vital signs are BP 116/66  Pulse 59  Ht 5\' 9"  (1.753 m)  Wt 138 lb (62.596 kg)  BMI 20.38 kg/m2  SpO2 100% General: The patient appears their stated age. HEENT:  No gross abnormalities Pulmonary:  Non labored breathing Abdomen: Soft and non-tender Musculoskeletal: There are no major deformities. Neurologic: No focal weakness or paresthesias are detected, Skin: There are no ulcer or rashes noted. Psychiatric: The patient has normal affect. Cardiovascular: There is a regular rate and rhythm without significant murmur appreciated. no carotid bruit   Diagnostic Studies  duplex ultrasound was ordered and reviewed today. This reveals a significant stenosis of the right mid common carotid artery at the proximal end of the carotid  endarterectomy patch. There is less than 40% stenosis of the left internal carotid artery.   Assessment: Status post right carotid endarterectomy with recurrent stenosis  Plan:  based on his ultrasound images today, I feel the need to be further evaluated with angiography. 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 Should I encounter a significant stenosis I would proceed with stenting at that time. I discussed the details of the procedure with the patient. We discussed the risk which include but are not limited to the risk of bleeding, and the risk of stroke. I have told him to continue his aspirin and Plavix. This will be scheduled for Tuesday, October 29   V. Charlena Cross, M.D. [facility] [place] Office: [phone] Pager:  [phone]

## 2012-07-05 ENCOUNTER — Encounter (HOSPITAL_COMMUNITY): Payer: Self-pay

## 2012-07-06 ENCOUNTER — Other Ambulatory Visit: Payer: Self-pay

## 2012-07-13 ENCOUNTER — Telehealth: Payer: Self-pay | Admitting: Surgery

## 2012-07-13 ENCOUNTER — Ambulatory Visit (HOSPITAL_COMMUNITY)
Admission: RE | Admit: 2012-07-13 | Discharge: 2012-07-13 | Disposition: A | Discharge status: Home / Self Care | Payer: Medicare Other | Source: Ambulatory Visit | Attending: Surgery | Admitting: Surgery

## 2012-07-13 ENCOUNTER — Encounter (HOSPITAL_COMMUNITY)
Admission: RE | Disposition: A | Discharge status: Home / Self Care | Payer: Self-pay | Source: Ambulatory Visit | Attending: Surgery

## 2012-07-13 ENCOUNTER — Other Ambulatory Visit: Payer: Self-pay | Admitting: *Deleted

## 2012-07-13 DIAGNOSIS — I6529 Occlusion and stenosis of unspecified carotid artery: Secondary | ICD-10-CM

## 2012-07-13 DIAGNOSIS — I10 Essential (primary) hypertension: Secondary | ICD-10-CM | POA: Insufficient documentation

## 2012-07-13 DIAGNOSIS — E785 Hyperlipidemia, unspecified: Secondary | ICD-10-CM | POA: Insufficient documentation

## 2012-07-13 DIAGNOSIS — I658 Occlusion and stenosis of other precerebral arteries: Secondary | ICD-10-CM | POA: Insufficient documentation

## 2012-07-13 DIAGNOSIS — Z48812 Encounter for surgical aftercare following surgery on the circulatory system: Secondary | ICD-10-CM

## 2012-07-13 HISTORY — PX: CAROTID ANGIOGRAM: SHX5504

## 2012-07-13 LAB — POCT I-STAT, CHEM 8
Calcium, Ion: 1.23 mmol/L (ref 1.12–1.23)
Glucose, Bld: 97 mg/dL (ref 70–99)
HCT: 45 % (ref 39.0–52.0)
Hemoglobin: 15.3 g/dL (ref 13.0–17.0)

## 2012-07-13 SURGERY — CAROTID ANGIOGRAM
Laterality: Right

## 2012-07-13 MED ORDER — SODIUM CHLORIDE 0.9 % IV SOLN: 1.0000 mL/kg/h | INTRAVENOUS | Status: DC

## 2012-07-13 MED ORDER — PHENOL 1.4 % MT LIQD: 1.0000 | OROMUCOSAL | Status: DC | PRN

## 2012-07-13 MED ORDER — OXYCODONE HCL 5 MG PO TABS: 5.0000 mg | ORAL_TABLET | ORAL | Status: DC | PRN

## 2012-07-13 MED ORDER — LABETALOL HCL 5 MG/ML IV SOLN: 10.0000 mg | INTRAVENOUS | Status: DC | PRN

## 2012-07-13 MED ORDER — HEPARIN (PORCINE) IN NACL 2-0.9 UNIT/ML-% IJ SOLN
INTRAMUSCULAR | Status: AC
  Filled 2012-07-13: qty 1000

## 2012-07-13 MED ORDER — LIDOCAINE HCL (PF) 1 % IJ SOLN
INTRAMUSCULAR | Status: AC
  Filled 2012-07-13: qty 30

## 2012-07-13 MED ORDER — ONDANSETRON HCL 4 MG/2ML IJ SOLN: 4.0000 mg | Freq: Four times a day (QID) | INTRAMUSCULAR | Status: DC | PRN

## 2012-07-13 MED ORDER — ACETAMINOPHEN 325 MG PO TABS: 325.0000 mg | ORAL_TABLET | ORAL | Status: DC | PRN

## 2012-07-13 MED ORDER — ALUM & MAG HYDROXIDE-SIMETH 200-200-20 MG/5ML PO SUSP: 15.0000 mL | ORAL | Status: DC | PRN

## 2012-07-13 MED ORDER — HYDRALAZINE HCL 20 MG/ML IJ SOLN: 10.0000 mg | INTRAMUSCULAR | Status: DC | PRN

## 2012-07-13 MED ORDER — METOPROLOL TARTRATE 1 MG/ML IV SOLN: 2.0000 mg | INTRAVENOUS | Status: DC | PRN

## 2012-07-13 MED ORDER — BIVALIRUDIN 250 MG IV SOLR
INTRAVENOUS | Status: AC
  Filled 2012-07-13: qty 250

## 2012-07-13 MED ORDER — SODIUM CHLORIDE 0.9 % IV SOLN
INTRAVENOUS | Status: DC
  Administered 2012-07-13: 08:00:00 via INTRAVENOUS

## 2012-07-13 MED ORDER — NOREPINEPHRINE BITARTRATE 1 MG/ML IJ SOLN
INTRAMUSCULAR | Status: AC
  Filled 2012-07-13: qty 4

## 2012-07-13 MED ORDER — ATROPINE SULFATE 1 MG/ML IJ SOLN
INTRAMUSCULAR | Status: AC
  Filled 2012-07-13: qty 1

## 2012-07-13 MED ORDER — GUAIFENESIN-DM 100-10 MG/5ML PO SYRP: 15.0000 mL | ORAL_SOLUTION | ORAL | Status: DC | PRN

## 2012-07-13 MED ORDER — ACETAMINOPHEN 325 MG RE SUPP: 325.0000 mg | RECTAL | Status: DC | PRN

## 2012-07-13 MED ORDER — MORPHINE SULFATE 10 MG/ML IJ SOLN: 2.0000 mg | INTRAMUSCULAR | Status: DC | PRN

## 2012-07-13 NOTE — Interval H&P Note (Signed)
History and Physical Interval Note:  07/13/2012 8:31 AM  Aaron Velez  has presented today for surgery, with the diagnosis of carotid stenosis  The various methods of treatment have been discussed with the patient and family. After consideration of risks, benefits and other options for treatment, the patient has consented to  Procedure(s) (LRB) with comments: CAROTID STENT INSERTION (Right) as a surgical intervention .  The patient's history has been reviewed, patient examined, no change in status, stable for surgery.  I have reviewed the patient's chart and labs.  Questions were answered to the patient's satisfaction.     BRABHAM IV, V. WELLS

## 2012-07-13 NOTE — Telephone Encounter (Signed)
Message copied by Margaretmary Eddy on Tue Jul 13, 2012  1:31 PM ------      Message from: Melene Plan      Created: Tue Jul 13, 2012 10:57 AM                   ----- Message -----         From: Nada Libman, MD         Sent: 07/13/2012   9:48 AM           To: Reuel Derby, Melene Plan, RN            07/13/2012:             1.  ultrasound access right femoral artery       2.  aortic arch angiogram       3.  second order cannulation (right common carotid artery)       4.  right carotid angiogram            Please schedule the patient to come back in 6 months with a carotid ultrasound

## 2012-07-13 NOTE — Op Note (Signed)
[facility] [place]  Patient name: Aaron Velez MRN: [id number] DOB: June 17, 1961 Sex: male  07/13/2012 Pre-operative Diagnosis: Recurrent carotid stenosis, right Post-operative diagnosis:  Same Surgeon:  Jorge Ny Procedure Performed:  1.  ultrasound access right femoral artery  2.  aortic arch angiogram  3.  second order cannulation (right common carotid artery)  4.  right carotid angiogram    Indications:  The patient is previously undergone right carotid endarterectomy. I have been following him with serial ultrasounds which shows a narrowing at the proximal patch. His most recent ultrasound suggested that this was significant and therefore he comes in today for angiography and possible stenting.  Procedure:  The patient was identified in the holding area and taken to room 8.  The patient was then placed supine on the table and prepped and draped in the usual sterile fashion.  A time out was called.  Ultrasound was used to evaluate the right common femoral artery.  It was patent .  A digital ultrasound image was acquired.  A micropuncture needle was used to access the right common femoral artery under ultrasound guidance.  An 018 wire was advanced without resistance and a micropuncture sheath was placed.  The 018 wire was removed and a benson wire was placed.  The micropuncture sheath was exchanged for a 5 french sheath.  A pigtail catheter was advanced over the wire into the a sitting aorta and an aortic arch angiogram was performed. Next, a Kumpe catheter was advanced into the proximal right common carotid artery and a right carotid angiogram with intracranial images was performed. The intracranial imaging will be separately interpreted by the neuroradiology. Findings:   Aortic arch:  A type I aortic arch is visualized. No significant stenosis is seen within the thoracic aorta. The innominate artery and right subclavian artery are widely patent. The left  subclavian artery is widely patent. There is a large left vertebral artery which is widely patent. There is approximately 60% stenosis within the proximal left common carotid artery.  Right carotid artery:  Approximately 60% stenosis is visualized at the proximal portion of the right common carotid artery and internal carotid artery patch angioplasty. The external carotid artery is widely patent as is the distal right internal carotid artery.   Intervention:  None  Impression:  #1  type I aortic arch  #2  60% stenosis within the right carotid artery, at the proximal end of the patch  #3  60% stenosis of the proximal left common carotid artery    V. Durene Cal, M.D. [facility] [place] Office: [phone] Pager:  [phone]

## 2012-07-13 NOTE — H&P (View-Only) (Signed)
[facility] [place]   Patient name: Aaron Velez MRN: [id number] DOB: 08/27/1961 Sex: male     Chief Complaint  Patient presents with  . Carotid    6 month f/u - rt CEA 12/05/2011    HISTORY OF PRESENT ILLNESS: The patient is back today for followup of his carotid occlusive disease. He is status post right carotid endarterectomy on 12/05/2010 for asymptomatic stenosis. Operative findings included 85% stenosis. The patient's postoperative course was uneventful. He had a slight marginal mandibular neurapraxia which resolved. We have been following him for a stenosis within the proximal portion of his patch. He remains asymptomatic. Specifically, he denies numbness or weakness in either extremity. He denies slurred speech. He denies amaurosis fugax. He continues to be medically managed for his hypertension and hyperlipidemia. He denies chest pain. He is status post MI in 2001 at 2008. He is on dual antiplatelet therapy. This includes aspirin and Plavix.  Past Medical History  Diagnosis Date  . CAD (coronary artery disease)   . HTN (hypertension)   . HLD (hyperlipidemia)   . Myocardial infarction 2001 & 2008  . Leg pain   . Chest pain   . Shortness of breath   . Carotid artery occlusion     Past Surgical History  Procedure Date  . Carotid endarterectomy 12/05/2010    right CEA  . Coronary angioplasty with stent placement 2001,2007,2008  . Finger surgery     For brown recluse spider bite    History   Social History  . Marital Status: Married    Spouse Name: N/A    Number of Children: 4  . Years of Education: N/A   Occupational History  . Not on file.   Social History Main Topics  . Smoking status: Current Every Day Smoker -- 1.0 packs/day    Types: Cigarettes  . Smokeless tobacco: Former User    Types: Chew  . Alcohol Use: No  . Drug Use: No  . Sexually Active: Not on file   Other Topics Concern  . Not on file   Social History Narrative    . No narrative on file    Family History  Problem Relation Age of Onset  . Hypertension    . Coronary artery disease      Allergies as of 06/28/2012 - Review Complete 06/28/2012  Allergen Reaction Noted  . Penicillins Rash 06/30/2011    Current Outpatient Prescriptions on File Prior to Visit  Medication Sig Dispense Refill  . aspirin 81 MG tablet Take 81 mg by mouth daily.        . atorvastatin (LIPITOR) 20 MG tablet Take 1 tablet (20 mg total) by mouth daily.  30 tablet  2  . carvedilol (COREG) 6.25 MG tablet Take 1 tablet (6.25 mg total) by mouth 2 (two) times daily.  180 tablet  3  . clopidogrel (PLAVIX) 75 MG tablet Take 1 tablet (75 mg total) by mouth daily.  30 tablet  6  . LORazepam (ATIVAN) 0.5 MG tablet Take 1 tablet (0.5 mg total) by mouth as needed. Dr McLean will not be able to provide additional refills  30 tablet  0  . Multiple Vitamin (MULTIVITAMIN) tablet Take 1 tablet by mouth daily.        . nitroGLYCERIN (NITROSTAT) 0.4 MG SL tablet Place 1 tablet (0.4 mg total) under the tongue every 5 (five) minutes as needed for chest pain.  25 tablet  11     REVIEW OF   SYSTEMS: No changes since prior visit  PHYSICAL EXAMINATION:   Vital signs are BP 116/66  Pulse 59  Ht 5' 9" (1.753 m)  Wt 138 lb (62.596 kg)  BMI 20.38 kg/m2  SpO2 100% General: The patient appears their stated age. HEENT:  No gross abnormalities Pulmonary:  Non labored breathing Abdomen: Soft and non-tender Musculoskeletal: There are no major deformities. Neurologic: No focal weakness or paresthesias are detected, Skin: There are no ulcer or rashes noted. Psychiatric: The patient has normal affect. Cardiovascular: There is a regular rate and rhythm without significant murmur appreciated. no carotid bruit   Diagnostic Studies  duplex ultrasound was ordered and reviewed today. This reveals a significant stenosis of the right mid common carotid artery at the proximal end of the carotid  endarterectomy patch. There is less than 40% stenosis of the left internal carotid artery.   Assessment: Status post right carotid endarterectomy with recurrent stenosis  Plan:  based on his ultrasound images today, I feel the need to be further evaluated with angiography. 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 Should I encounter a significant stenosis I would proceed with stenting at that time. I discussed the details of the procedure with the patient. We discussed the risk which include but are not limited to the risk of bleeding, and the risk of stroke. I have told him to continue his aspirin and Plavix. This will be scheduled for Tuesday, October 29   Aaron Velez IV, M.D. [facility] [place] Office: [phone] Pager:  [phone]   

## 2012-07-19 NOTE — Consult Note (Signed)
NAME:  Aaron Velez, Aaron Velez                  ACCOUNT NO.:  MEDICAL RECORD NO.:  [id number]  LOCATION:                                 FACILITY:  PHYSICIAN:  Delroy Ordway K. Sheralyn Pinegar, M.D.DATE OF BIRTH:  Oct 13, 1960  DATE OF CONSULTATION: DATE OF DISCHARGE:                                CONSULTATION   CLINICAL HISTORY:  Patient with progressive right common carotid artery stenosis.  EXAMINATION:    Intracranial interpretation of right common carotid arteriogram.  The right common carotid arteriogram demonstrates the distal cervical segment of the right internal carotid artery  to be normal.  The petrous, cavernous, and supraclinoid segments opacify normally.  The right middle and the right anterior cerebral arteries opacify  normally into the capillary and venous phases.  IMPRESSION:    1. Single right common carotid arteriogram intracranial injection demonstrates no gross stenosis, dissections of intraluminal filling defects.          ______________________________ Grandville Silos. Corliss Skains, M.D.     SKD/MEDQ  D:  07/16/2012  T:  07/16/2012  Job:  [id number]

## 2012-08-06 ENCOUNTER — Other Ambulatory Visit: Payer: Medicare Other

## 2012-08-13 ENCOUNTER — Other Ambulatory Visit (INDEPENDENT_AMBULATORY_CARE_PROVIDER_SITE_OTHER): Payer: Medicare Other

## 2012-08-13 DIAGNOSIS — I2510 Atherosclerotic heart disease of native coronary artery without angina pectoris: Secondary | ICD-10-CM

## 2012-08-13 LAB — LIPID PANEL
Cholesterol: 158 mg/dL (ref 0–200)
HDL: 30.6 mg/dL — ABNORMAL LOW (ref >39.00)
LDL Cholesterol: 94 mg/dL (ref 0–99)
Total CHOL/HDL Ratio: 5
Triglycerides: 169 mg/dL — ABNORMAL HIGH (ref 0.0–149.0)
VLDL: 33.8 mg/dL (ref 0.0–40.0)

## 2012-08-13 LAB — HEPATIC FUNCTION PANEL
ALT: 54 U/L — ABNORMAL HIGH (ref 0–53)
AST: 42 U/L — ABNORMAL HIGH (ref 0–37)
Albumin: 4.1 g/dL (ref 3.5–5.2)
Alkaline Phosphatase: 62 U/L (ref 39–117)
Bilirubin, Direct: 0.1 mg/dL (ref 0.0–0.3)
Total Bilirubin: 0.6 mg/dL (ref 0.3–1.2)
Total Protein: 7.1 g/dL (ref 6.0–8.3)

## 2012-08-16 ENCOUNTER — Telehealth: Payer: Self-pay | Admitting: Cardiology

## 2012-08-16 ENCOUNTER — Other Ambulatory Visit: Payer: Self-pay

## 2012-08-16 DIAGNOSIS — E785 Hyperlipidemia, unspecified: Secondary | ICD-10-CM

## 2012-08-16 NOTE — Telephone Encounter (Signed)
Pt given lab results, he verbalized understanding. 

## 2012-08-16 NOTE — Telephone Encounter (Signed)
New problem:   Returning call back nurse.

## 2012-09-13 ENCOUNTER — Other Ambulatory Visit: Payer: Medicare Other

## 2012-09-16 ENCOUNTER — Other Ambulatory Visit: Payer: Medicare Other

## 2012-09-17 ENCOUNTER — Other Ambulatory Visit: Payer: Self-pay | Admitting: *Deleted

## 2012-09-17 ENCOUNTER — Other Ambulatory Visit (INDEPENDENT_AMBULATORY_CARE_PROVIDER_SITE_OTHER): Payer: Medicare Other

## 2012-09-17 DIAGNOSIS — E785 Hyperlipidemia, unspecified: Secondary | ICD-10-CM

## 2012-09-17 LAB — HEPATIC FUNCTION PANEL
ALT: 30 U/L (ref 0–53)
Albumin: 3.9 g/dL (ref 3.5–5.2)
Alkaline Phosphatase: 60 U/L (ref 39–117)
Total Protein: 7 g/dL (ref 6.0–8.3)

## 2012-09-20 ENCOUNTER — Other Ambulatory Visit: Payer: Self-pay | Admitting: *Deleted

## 2012-09-20 ENCOUNTER — Telehealth: Payer: Self-pay | Admitting: Cardiology

## 2012-09-20 DIAGNOSIS — I2510 Atherosclerotic heart disease of native coronary artery without angina pectoris: Secondary | ICD-10-CM

## 2012-09-20 MED ORDER — ATORVASTATIN CALCIUM 40 MG PO TABS: 40.0000 mg | ORAL_TABLET | Freq: Every day | ORAL | Status: DC

## 2012-09-20 NOTE — Telephone Encounter (Signed)
New Problem: ° ° ° °Patient called in returning your call regarding his lab results.  Please call back. °

## 2012-09-20 NOTE — Telephone Encounter (Signed)
Spoke with pt about recent lab results 

## 2012-10-13 ENCOUNTER — Other Ambulatory Visit: Payer: Self-pay | Admitting: Cardiology

## 2012-11-18 ENCOUNTER — Other Ambulatory Visit (INDEPENDENT_AMBULATORY_CARE_PROVIDER_SITE_OTHER): Payer: Medicare Other

## 2012-11-18 DIAGNOSIS — I2510 Atherosclerotic heart disease of native coronary artery without angina pectoris: Secondary | ICD-10-CM

## 2012-11-18 LAB — LIPID PANEL
HDL: 24.6 mg/dL — ABNORMAL LOW (ref >39.00)
Triglycerides: 126 mg/dL (ref 0.0–149.0)
VLDL: 25.2 mg/dL (ref 0.0–40.0)

## 2012-11-18 LAB — HEPATIC FUNCTION PANEL
Albumin: 3.7 g/dL (ref 3.5–5.2)
Alkaline Phosphatase: 53 U/L (ref 39–117)
Bilirubin, Direct: 0 mg/dL (ref 0.0–0.3)

## 2013-01-06 HISTORY — PX: PERCUTANEOUS PLACEMENT INTRAVASCULAR STENT CERVICAL CAROTID ARTERY: SUR1019

## 2013-01-07 ENCOUNTER — Encounter: Payer: Self-pay | Admitting: Surgery

## 2013-01-10 ENCOUNTER — Other Ambulatory Visit (INDEPENDENT_AMBULATORY_CARE_PROVIDER_SITE_OTHER): Payer: Medicare Other | Admitting: *Deleted

## 2013-01-10 ENCOUNTER — Ambulatory Visit (INDEPENDENT_AMBULATORY_CARE_PROVIDER_SITE_OTHER): Payer: Medicare Other | Admitting: Surgery

## 2013-01-10 ENCOUNTER — Encounter: Payer: Self-pay | Admitting: Surgery

## 2013-01-10 DIAGNOSIS — I6529 Occlusion and stenosis of unspecified carotid artery: Secondary | ICD-10-CM

## 2013-01-10 DIAGNOSIS — Z48812 Encounter for surgical aftercare following surgery on the circulatory system: Secondary | ICD-10-CM

## 2013-01-10 NOTE — Addendum Note (Signed)
Addended by: Sharee Pimple on: 01/10/2013 04:28 PM   Modules accepted: Orders

## 2013-01-10 NOTE — Progress Notes (Signed)
[facility] [place]   Patient name: Aaron Velez MRN: [id number] DOB: 10/03/1960 Sex: male     Chief Complaint  Patient presents with  . Re-evaluation    6 month f/u s/p angiorgam    HISTORY OF PRESENT ILLNESS: The patient is back today for followup of his carotid occlusive disease. He is status post right carotid endarterectomy on 12/05/2010 for asymptomatic stenosis. Operative findings included 85% stenosis. The patient's postoperative course was uneventful. He had a slight marginal mandibular neurapraxia which resolved. We have been following him for a stenosis within the proximal portion of his patch. He remains asymptomatic. 6 months ago he underwent angiography to better define the lesion within his proximal right common carotid artery and left common carotid artery. I identified approximately 60% stenosis of the proximal portion of the right carotid artery patch. No intervention was performed. Back for followup. He continues to be asymptomatic. Specifically, he denies numbness or weakness in either extremity. He denies slurred speech. He denies ever since he takes. He continues to be managed with a statin for his hypertension. He is also medically managed for his hyperlipidemia. He is on several age antiplatelet therapy for his coronary artery and peripheral vascular disease.  Past Medical History  Diagnosis Date  . CAD (coronary artery disease)   . HTN (hypertension)   . HLD (hyperlipidemia)   . Myocardial infarction 2001 & 2008  . Leg pain   . Chest pain   . Shortness of breath   . Carotid artery occlusion     Past Surgical History  Procedure Laterality Date  . Carotid endarterectomy  12/05/2010    right CEA  . Coronary angioplasty with stent placement  2001,2007,2008  . Finger surgery      For brown recluse spider bite    History   Social History  . Marital Status: Married    Spouse Name: N/A    Number of Children: 4  . Years of Education:  N/A   Occupational History  . Not on file.   Social History Main Topics  . Smoking status: Current Every Day Smoker -- 1.00 packs/day    Types: Cigarettes  . Smokeless tobacco: Former User    Types: Chew  . Alcohol Use: No  . Drug Use: No  . Sexually Active: Not on file   Other Topics Concern  . Not on file   Social History Narrative  . No narrative on file    Family History  Problem Relation Age of Onset  . Hypertension    . Coronary artery disease      Allergies as of 01/10/2013 - Review Complete 01/10/2013  Allergen Reaction Noted  . Penicillins Rash 06/30/2011    Current Outpatient Prescriptions on File Prior to Visit  Medication Sig Dispense Refill  . aspirin 81 MG tablet Take 81 mg by mouth daily.        . atorvastatin (LIPITOR) 40 MG tablet Take 1 tablet (40 mg total) by mouth daily.  30 tablet  3  . atorvastatin (LIPITOR) 40 MG tablet Take 1 tablet (40 mg total) by mouth daily.  30 tablet  5  . carvedilol (COREG) 6.25 MG tablet Take 1 tablet (6.25 mg total) by mouth 2 (two) times daily.  180 tablet  3  . clopidogrel (PLAVIX) 75 MG tablet TAKE ONE TABLET BY MOUTH EVERY DAY  30 tablet  5  . LORazepam (ATIVAN) 0.5 MG tablet Take 1 tablet (0.5 mg total) by mouth as   needed. Dr McLean will not be able to provide additional refills  30 tablet  0  . Multiple Vitamin (MULTIVITAMIN) tablet Take 1 tablet by mouth daily.        . nitroGLYCERIN (NITROSTAT) 0.4 MG SL tablet Place 1 tablet (0.4 mg total) under the tongue every 5 (five) minutes as needed for chest pain.  25 tablet  11   No current facility-administered medications on file prior to visit.     REVIEW OF SYSTEMS: No changes from prior visit  PHYSICAL EXAMINATION:   Vital signs are BP 108/65  Pulse 62  Ht 5' 9" (1.753 m)  Wt 139 lb (63.05 kg)  BMI 20.52 kg/m2  SpO2 100% General: The patient appears their stated age. HEENT:  No gross abnormalities Pulmonary:  Non labored breathing Musculoskeletal: There  are no major deformities. Neurologic: No focal weakness or paresthesias are detected, Skin: There are no ulcer or rashes noted. Psychiatric: The patient has normal affect. Cardiovascular: There is a regular rate and rhythm without significant murmur appreciated. Bilateral carotid bruits   Diagnostic Studies Ultrasound today again shows elevated velocities at the proximal portion of the patch. There significantly increased today. Peak velocity on the right is 337/110 he also has evidence of proximal left common carotid stenosis. No significant left internal carotid stenosis is identified. Bilateral antegrade vertebral artery blood flow.  Assessment: Asymptomatic carotid stenosis Plan: I discussed the ultrasound findings today with the patient. Velocity profile has increased in the area of concern within the right carotid artery by today's scan. I feel that this needs to be further evaluated and if it is indeed hemodynamically significant, I would recommend carotid stenting. Unfortunately, the Registry for asymptomatic patient has closed and is very difficult to get stenting accomplished in an asymptomatic patient despite the fact that he is high risk based on his previous endarterectomy. I discussed these findings with the patient. We will try to go ahead and get preapproval common insurance perspective. If we can do that, I recommend proceeding with angiography and stenting if indicated of his right carotid artery stenosis. He will continue on his aspirin and Plavix.  V. Wells Brabham IV, M.D. [facility] [place] Office: [phone] Pager:  [phone]   

## 2013-01-11 ENCOUNTER — Telehealth: Payer: Self-pay | Admitting: *Deleted

## 2013-01-11 NOTE — Telephone Encounter (Signed)
I called the patient to discuss his possible carotid stenting in relation to Medicare benefits. I discussed with him that Medicare would not approve or deny coverage prior to the surgery but would evaluate the charges on the "back end". This patient is asymptomatic at this time but does have rapid increase of his right carotid velocities and had previous right carotid endarterectomy in 2012. Dr. Myra Gianotti feels that carotid stenting would be in the patient's best interest in order to avoid complications such as CVA. The patient voiced understanding of Medicare's policy on this surgery and still wishes to proceed with scheduling his right carotid stenting asap. He does not want to wait until he becomes symptomatic or has a stroke. I will have our schedulers contact him to set this up

## 2013-01-12 ENCOUNTER — Encounter (HOSPITAL_COMMUNITY): Payer: Self-pay | Admitting: Pharmacy Technician

## 2013-01-12 ENCOUNTER — Other Ambulatory Visit: Payer: Self-pay

## 2013-01-18 ENCOUNTER — Encounter (HOSPITAL_COMMUNITY)
Admission: RE | Disposition: A | Discharge status: Home / Self Care | Payer: Self-pay | Source: Ambulatory Visit | Attending: Surgery

## 2013-01-18 ENCOUNTER — Inpatient Hospital Stay (HOSPITAL_COMMUNITY)
Admission: RE | Admit: 2013-01-18 | Discharge: 2013-01-19 | DRG: 036 | Disposition: A | Discharge status: Home / Self Care | Payer: Medicare Other | Source: Ambulatory Visit | Attending: Surgery | Admitting: Surgery

## 2013-01-18 ENCOUNTER — Encounter (HOSPITAL_COMMUNITY): Payer: Self-pay | Admitting: *Deleted

## 2013-01-18 DIAGNOSIS — I6529 Occlusion and stenosis of unspecified carotid artery: Principal | ICD-10-CM | POA: Diagnosis present

## 2013-01-18 DIAGNOSIS — I252 Old myocardial infarction: Secondary | ICD-10-CM

## 2013-01-18 DIAGNOSIS — F17200 Nicotine dependence, unspecified, uncomplicated: Secondary | ICD-10-CM | POA: Diagnosis present

## 2013-01-18 DIAGNOSIS — I10 Essential (primary) hypertension: Secondary | ICD-10-CM | POA: Diagnosis present

## 2013-01-18 DIAGNOSIS — Z9861 Coronary angioplasty status: Secondary | ICD-10-CM

## 2013-01-18 DIAGNOSIS — I2510 Atherosclerotic heart disease of native coronary artery without angina pectoris: Secondary | ICD-10-CM | POA: Diagnosis present

## 2013-01-18 DIAGNOSIS — E785 Hyperlipidemia, unspecified: Secondary | ICD-10-CM | POA: Diagnosis present

## 2013-01-18 HISTORY — PX: CAROTID STENT INSERTION: SHX5505

## 2013-01-18 LAB — MRSA PCR SCREENING: MRSA by PCR: NEGATIVE

## 2013-01-18 LAB — POCT I-STAT, CHEM 8
Calcium, Ion: 1.28 mmol/L — ABNORMAL HIGH (ref 1.12–1.23)
Creatinine, Ser: 0.7 mg/dL (ref 0.50–1.35)
Hemoglobin: 15.3 g/dL (ref 13.0–17.0)
Sodium: 142 mEq/L (ref 135–145)
TCO2: 25 mmol/L (ref 0–100)

## 2013-01-18 LAB — POCT ACTIVATED CLOTTING TIME: Activated Clotting Time: 296 seconds

## 2013-01-18 SURGERY — CAROTID STENT INSERTION
Anesthesia: LOCAL

## 2013-01-18 MED ORDER — ONE-DAILY MULTI VITAMINS PO TABS: 1.0000 | ORAL_TABLET | Freq: Every day | ORAL | Status: DC

## 2013-01-18 MED ORDER — ASPIRIN 81 MG PO CHEW
CHEWABLE_TABLET | ORAL | Status: AC
  Filled 2013-01-18: qty 1

## 2013-01-18 MED ORDER — LORAZEPAM 0.5 MG PO TABS: 0.5000 mg | ORAL_TABLET | ORAL | Status: DC | PRN

## 2013-01-18 MED ORDER — ACETAMINOPHEN 650 MG RE SUPP: 325.0000 mg | RECTAL | Status: DC | PRN

## 2013-01-18 MED ORDER — NOREPINEPHRINE BITARTRATE 1 MG/ML IJ SOLN
INTRAMUSCULAR | Status: AC
  Filled 2013-01-18: qty 4

## 2013-01-18 MED ORDER — ASPIRIN EC 81 MG PO TBEC
81.0000 mg | DELAYED_RELEASE_TABLET | Freq: Every day | ORAL | Status: DC
  Filled 2013-01-18: qty 1

## 2013-01-18 MED ORDER — BIVALIRUDIN 250 MG IV SOLR
INTRAVENOUS | Status: AC
  Filled 2013-01-18: qty 250

## 2013-01-18 MED ORDER — CLOPIDOGREL BISULFATE 75 MG PO TABS
75.0000 mg | ORAL_TABLET | Freq: Every day | ORAL | Status: DC
  Administered 2013-01-19: 75 mg via ORAL
  Filled 2013-01-18 (×2): qty 1

## 2013-01-18 MED ORDER — PHENOL 1.4 % MT LIQD: 1.0000 | OROMUCOSAL | Status: DC | PRN

## 2013-01-18 MED ORDER — ADULT MULTIVITAMIN W/MINERALS CH
1.0000 | ORAL_TABLET | Freq: Every day | ORAL | Status: DC
  Administered 2013-01-18: 1 via ORAL
  Filled 2013-01-18 (×2): qty 1

## 2013-01-18 MED ORDER — OXYCODONE-ACETAMINOPHEN 5-325 MG PO TABS
ORAL_TABLET | ORAL | Status: AC
  Filled 2013-01-18: qty 1

## 2013-01-18 MED ORDER — ASPIRIN 81 MG PO CHEW
81.0000 mg | CHEWABLE_TABLET | Freq: Once | ORAL | Status: AC
  Administered 2013-01-18: 81 mg via ORAL

## 2013-01-18 MED ORDER — SODIUM CHLORIDE 0.9 % IV SOLN
INTRAVENOUS | Status: DC
  Administered 2013-01-18: 06:00:00 via INTRAVENOUS

## 2013-01-18 MED ORDER — HEPARIN SODIUM (PORCINE) 1000 UNIT/ML IJ SOLN
INTRAMUSCULAR | Status: AC
  Filled 2013-01-18: qty 1

## 2013-01-18 MED ORDER — HYDRALAZINE HCL 20 MG/ML IJ SOLN: 10.0000 mg | INTRAMUSCULAR | Status: DC | PRN

## 2013-01-18 MED ORDER — CARVEDILOL 6.25 MG PO TABS
6.2500 mg | ORAL_TABLET | Freq: Two times a day (BID) | ORAL | Status: DC
  Administered 2013-01-18 – 2013-01-19 (×2): 6.25 mg via ORAL
  Filled 2013-01-18 (×4): qty 1

## 2013-01-18 MED ORDER — ONDANSETRON HCL 4 MG/2ML IJ SOLN: 4.0000 mg | Freq: Four times a day (QID) | INTRAMUSCULAR | Status: DC | PRN

## 2013-01-18 MED ORDER — GUAIFENESIN-DM 100-10 MG/5ML PO SYRP: 15.0000 mL | ORAL_SOLUTION | ORAL | Status: DC | PRN

## 2013-01-18 MED ORDER — ALUM & MAG HYDROXIDE-SIMETH 200-200-20 MG/5ML PO SUSP: 15.0000 mL | ORAL | Status: DC | PRN

## 2013-01-18 MED ORDER — LABETALOL HCL 5 MG/ML IV SOLN: 10.0000 mg | INTRAVENOUS | Status: DC | PRN

## 2013-01-18 MED ORDER — METOPROLOL TARTRATE 1 MG/ML IV SOLN: 2.0000 mg | INTRAVENOUS | Status: DC | PRN

## 2013-01-18 MED ORDER — OXYCODONE-ACETAMINOPHEN 5-325 MG PO TABS
1.0000 | ORAL_TABLET | ORAL | Status: DC | PRN
  Administered 2013-01-18 (×3): 1 via ORAL
  Administered 2013-01-19: 2 via ORAL
  Filled 2013-01-18 (×3): qty 2

## 2013-01-18 MED ORDER — ASPIRIN 81 MG PO TABS: 81.0000 mg | ORAL_TABLET | Freq: Every day | ORAL | Status: DC

## 2013-01-18 MED ORDER — ATROPINE SULFATE 1 MG/ML IJ SOLN
INTRAMUSCULAR | Status: AC
  Filled 2013-01-18: qty 1

## 2013-01-18 MED ORDER — SODIUM CHLORIDE 0.9 % IV SOLN: INTRAVENOUS | Status: DC

## 2013-01-18 MED ORDER — ATORVASTATIN CALCIUM 40 MG PO TABS
40.0000 mg | ORAL_TABLET | Freq: Every day | ORAL | Status: DC
  Administered 2013-01-18: 40 mg via ORAL
  Filled 2013-01-18 (×2): qty 1

## 2013-01-18 MED ORDER — NITROGLYCERIN 0.4 MG SL SUBL
0.4000 mg | SUBLINGUAL_TABLET | SUBLINGUAL | Status: DC | PRN
Start: 2013-01-18 — End: 2013-01-18

## 2013-01-18 MED ORDER — CLOPIDOGREL BISULFATE 75 MG PO TABS
75.0000 mg | ORAL_TABLET | Freq: Once | ORAL | Status: AC
  Administered 2013-01-18: 75 mg via ORAL

## 2013-01-18 MED ORDER — NITROGLYCERIN 0.4 MG SL SUBL: 0.4000 mg | SUBLINGUAL_TABLET | SUBLINGUAL | Status: DC | PRN

## 2013-01-18 MED ORDER — CLOPIDOGREL BISULFATE 75 MG PO TABS
ORAL_TABLET | ORAL | Status: AC
  Filled 2013-01-18: qty 1

## 2013-01-18 MED ORDER — ACETAMINOPHEN 325 MG PO TABS: 325.0000 mg | ORAL_TABLET | ORAL | Status: DC | PRN

## 2013-01-18 NOTE — H&P (View-Only) (Signed)
[facility] [place]   Patient name: Aaron Velez MRN: [id number] DOB: 04/18/1961 Sex: male     Chief Complaint  Patient presents with  . Re-evaluation    6 month f/u s/p angiorgam    HISTORY OF PRESENT ILLNESS: The patient is back today for followup of his carotid occlusive disease. He is status post right carotid endarterectomy on 12/05/2010 for asymptomatic stenosis. Operative findings included 85% stenosis. The patient's postoperative course was uneventful. He had a slight marginal mandibular neurapraxia which resolved. We have been following him for a stenosis within the proximal portion of his patch. He remains asymptomatic. 6 months ago he underwent angiography to better define the lesion within his proximal right common carotid artery and left common carotid artery. I identified approximately 60% stenosis of the proximal portion of the right carotid artery patch. No intervention was performed. Back for followup. He continues to be asymptomatic. Specifically, he denies numbness or weakness in either extremity. He denies slurred speech. He denies ever since he takes. He continues to be managed with a statin for his hypertension. He is also medically managed for his hyperlipidemia. He is on several age antiplatelet therapy for his coronary artery and peripheral vascular disease.  Past Medical History  Diagnosis Date  . CAD (coronary artery disease)   . HTN (hypertension)   . HLD (hyperlipidemia)   . Myocardial infarction 2001 & 2008  . Leg pain   . Chest pain   . Shortness of breath   . Carotid artery occlusion     Past Surgical History  Procedure Laterality Date  . Carotid endarterectomy  12/05/2010    right CEA  . Coronary angioplasty with stent placement  2001,2007,2008  . Finger surgery      For brown recluse spider bite    History   Social History  . Marital Status: Married    Spouse Name: N/A    Number of Children: 4  . Years of Education:  N/A   Occupational History  . Not on file.   Social History Main Topics  . Smoking status: Current Every Day Smoker -- 1.00 packs/day    Types: Cigarettes  . Smokeless tobacco: Former Neurosurgeon    Types: Chew  . Alcohol Use: No  . Drug Use: No  . Sexually Active: Not on file   Other Topics Concern  . Not on file   Social History Narrative  . No narrative on file    Family History  Problem Relation Age of Onset  . Hypertension    . Coronary artery disease      Allergies as of 01/10/2013 - Review Complete 01/10/2013  Allergen Reaction Noted  . Penicillins Rash 06/30/2011    Current Outpatient Prescriptions on File Prior to Visit  Medication Sig Dispense Refill  . aspirin 81 MG tablet Take 81 mg by mouth daily.        Marland Kitchen atorvastatin (LIPITOR) 40 MG tablet Take 1 tablet (40 mg total) by mouth daily.  30 tablet  3  . atorvastatin (LIPITOR) 40 MG tablet Take 1 tablet (40 mg total) by mouth daily.  30 tablet  5  . carvedilol (COREG) 6.25 MG tablet Take 1 tablet (6.25 mg total) by mouth 2 (two) times daily.  180 tablet  3  . clopidogrel (PLAVIX) 75 MG tablet TAKE ONE TABLET BY MOUTH EVERY DAY  30 tablet  5  . LORazepam (ATIVAN) 0.5 MG tablet Take 1 tablet (0.5 mg total) by mouth as  needed. Dr Shirlee Latch will not be able to provide additional refills  30 tablet  0  . Multiple Vitamin (MULTIVITAMIN) tablet Take 1 tablet by mouth daily.        . nitroGLYCERIN (NITROSTAT) 0.4 MG SL tablet Place 1 tablet (0.4 mg total) under the tongue every 5 (five) minutes as needed for chest pain.  25 tablet  11   No current facility-administered medications on file prior to visit.     REVIEW OF SYSTEMS: No changes from prior visit  PHYSICAL EXAMINATION:   Vital signs are BP 108/65  Pulse 62  Ht 5\' 9"  (1.753 m)  Wt 139 lb (63.05 kg)  BMI 20.52 kg/m2  SpO2 100% General: The patient appears their stated age. HEENT:  No gross abnormalities Pulmonary:  Non labored breathing Musculoskeletal: There  are no major deformities. Neurologic: No focal weakness or paresthesias are detected, Skin: There are no ulcer or rashes noted. Psychiatric: The patient has normal affect. Cardiovascular: There is a regular rate and rhythm without significant murmur appreciated. Bilateral carotid bruits   Diagnostic Studies Ultrasound today again shows elevated velocities at the proximal portion of the patch. There significantly increased today. Peak velocity on the right is 337/110 he also has evidence of proximal left common carotid stenosis. No significant left internal carotid stenosis is identified. Bilateral antegrade vertebral artery blood flow.  Assessment: Asymptomatic carotid stenosis Plan: I discussed the ultrasound findings today with the patient. Velocity profile has increased in the area of concern within the right carotid artery by today's scan. I feel that this needs to be further evaluated and if it is indeed hemodynamically significant, I would recommend carotid stenting. Unfortunately, the Registry for asymptomatic patient has closed and is very difficult to get stenting accomplished in an asymptomatic patient despite the fact that he is high risk based on his previous endarterectomy. I discussed these findings with the patient. We will try to go ahead and get preapproval common insurance perspective. If we can do that, I recommend proceeding with angiography and stenting if indicated of his right carotid artery stenosis. He will continue on his aspirin and Plavix.  Jorge Ny, M.D. [facility] [place] Office: [phone] Pager:  [phone]

## 2013-01-18 NOTE — Interval H&P Note (Signed)
History and Physical Interval Note:  01/18/2013 7:47 AM  Aaron Velez  has presented today for surgery, with the diagnosis of Carotid stenosis  The various methods of treatment have been discussed with the patient and family. After consideration of risks, benefits and other options for treatment, the patient has consented to  Procedure(s): CAROTID STENT INSERTION (N/A) as a surgical intervention .  The patient's history has been reviewed, patient examined, no change in status, stable for surgery.  I have reviewed the patient's chart and labs.  Questions were answered to the patient's satisfaction.     Orval Dortch IV, V. [facility]

## 2013-01-18 NOTE — Op Note (Signed)
[facility] [place]  Patient name: Aaron Velez MRN: [id number] DOB: Jul 02, 1961 Sex: male  01/18/2013 Pre-operative Diagnosis: Asymptomatic recurrent right carotid stenosis Post-operative diagnosis:  Same Surgeon:  Jorge Ny, Nanetta Batty Procedure Performed:  1.  ultrasound-guided access, right femoral artery  2.  aortic arch angiogram  3.  right carotid stem with distal embolic protection    Indications:  The patient is previously undergone right carotid endarterectomy. He has developed by ultrasound a stenosis at the proximal portion of his patch. Last year, he underwent a diagnostic carotid arteriogram to better define the stenosis at the proximal portion of the patch. It was felt to be approximately 60% and therefore no intervention was performed. Ultrasound recently identified the stenosis in the proximal patch, however the velocity profile had significantly increased. The patient comes in today for further evaluation and possible treatment. I discussed the because he has artery had a carotid endarterectomy that I would recommend stenting in this situation to lower his perioperative/periprocedural morbidity.  Procedure:  The patient was identified in the holding area and taken to room 8.  The patient was then placed supine on the table and prepped and draped in the usual sterile fashion.  A time out was called.  Ultrasound was used to evaluate the right common femoral artery.  It was patent .  A digital ultrasound image was acquired.  A micropuncture needle was used to access the right common femoral artery under ultrasound guidance.  An 018 wire was advanced without resistance and a micropuncture sheath was placed.  The 018 wire was removed and a benson wire was placed.  The micropuncture sheath was exchanged for a 6 french 90 cm sheath. A pigtail catheter was advanced into the aortic arch an aortic arch angiogram was performed. Next using a JB 1  catheter the innominate artery was selected. The catheter was advanced into the right carotid artery and right carotid angiogram was performed in multiple obliquities with intracranial imaging. The intracranial images will be separately dictated by the neuroradiology.  Findings:   Aortic arch:  A type I aortic arch is identified. No significant stenosis is identified within the innominate or right subclavian artery. The right common carotid artery is patent throughout it's course. There is a patulous dilatation at the bifurcation. There is approximately 75% stenosis at the proximal portion of the carotid endarterectomy patch. The remaining portion of the internal carotid artery is widely patent. A 65% stenosis is identified within the left common carotid artery. No significant internal carotid artery stenosis is identified on the left. The left subclavian artery is widely patent.  Right carotid artery:  The right common carotid artery is patent throughout it's course. There is a patulous dilatation at the bifurcation. There is approximately 75% stenosis at the proximal portion of the carotid endarterectomy patch. The remaining portion of the internal carotid artery is widely patent.   Intervention:  After the above images were obtained, the decision was made to proceed with intervention. An Angiomax bolus and continuous infusion was administered. The ACT was confirmed to be appropriate. A Amplatz superstiff wire was placed through JB 1 catheter. The sheath was then advanced into the right common carotid artery. Next, a large NAV-6 filter was prepared on the back table. It was advanced without resistance into the distal internal carotid artery. I elected not to predilated the lesion. A XACT 9 x 30 stent was selected. It was placed across the lesion and successfully deployed. It  was post dilated with a 5.5 x 20 balloon. Completion angiography was then performed. This showed a widely patent stent with stenosis  down to less than 10%. There was no debris within the filter. Next, the filter was retrieved. Additional intra-extracranial imaging was performed with no significant change intracranial from preprocedural. The stenosis within the proximal patch was resolved. There was mild spasm at the filter site. The patient remained neurologically intact. I exchanged out the long 6 [place] sheath for a short 6 [place] sheath. There were no complications  Impression:  #1  successful right carotid artery stenting with distal embolic protection using a 9 x 30 stent. Stenosis went from 75% to less than 10% after the intervention.   Juleen China, M.D. [facility] [place] Office: [phone] Pager:  [phone]

## 2013-01-18 NOTE — Progress Notes (Signed)
Utilization review completed.  

## 2013-01-19 ENCOUNTER — Telehealth: Payer: Self-pay | Admitting: Surgery

## 2013-01-19 NOTE — Discharge Summary (Signed)
[facility] Discharge Summary  Aaron Velez 10-May-1961 52 y.o. male  [id number]  Admission Date: 01/18/2013  Discharge Date: 01/19/13  Physician: Nada Libman, MD  Admission Diagnosis: Carotid stenosis   HPI:   This is a 52 y.o. male who is back today for followup of his carotid occlusive disease. He is status post right carotid endarterectomy on 12/05/2010 for asymptomatic stenosis. Operative findings included 85% stenosis. The patient's postoperative course was uneventful. He had a slight marginal mandibular neurapraxia which resolved. We have been following him for a stenosis within the proximal portion of his patch. He remains asymptomatic. 6 months ago he underwent angiography to better define the lesion within his proximal right common carotid artery and left common carotid artery. I identified approximately 60% stenosis of the proximal portion of the right carotid artery patch. No intervention was performed. Back for followup. He continues to be asymptomatic. Specifically, he denies numbness or weakness in either extremity. He denies slurred speech. He denies ever since he takes. He continues to be managed with a statin for his hypertension. He is also medically managed for his hyperlipidemia. He is on several age antiplatelet therapy for his coronary artery and peripheral vascular disease.   Hospital Course:  The patient was admitted to the hospital and taken to the operating room on 01/18/2013 and underwent  1. ultrasound-guided access, right femoral artery  2. aortic arch angiogram  3. right carotid stent with distal embolic protection  The pt tolerated the procedure well and was transported to the PACU in good condition.   By POD 1, the pt neuro status in tact.  His right groin is soft without hematoma and he has palpable DP pulses bilaterally.  The remainder of the hospital course consisted of increasing mobilization and increasing intake of solids  without difficulty.    Recent Labs  01/18/13 0615  NA 142  K 4.0  CL 107  GLUCOSE 105*  BUN 11    Recent Labs  01/18/13 0615  HGB 15.3  HCT 45.0   No results found for this basename: INR,  in the last 72 hours  Discharge Instructions:   The patient is discharged to home with extensive instructions on wound care and progressive ambulation.  They are instructed not to drive or perform any heavy lifting until returning to see the physician in his office.  Discharge Orders   Future Orders Complete By Expires     CAROTID Sugery: Call MD for difficulty swallowing or speaking; weakness in arms or legs that is a new symtom; severe headache.  If you have increased swelling in the neck and/or  are having difficulty breathing, CALL 911  As directed     Call MD for:  redness, tenderness, or signs of infection (pain, swelling, bleeding, redness, odor or green/yellow discharge around incision site)  As directed     Call MD for:  severe or increased pain, loss or decreased feeling  in affected limb(s)  As directed     Call MD for:  temperature >100.5  As directed     Driving Restrictions  As directed     Comments:      No driving for 2 weeks    Increase activity slowly  As directed     Comments:      Walk with assistance use walker or cane as needed    Lifting restrictions  As directed     Comments:      No lifting for 4  weeks    May shower   As directed     Scheduling Instructions:      Thursday    Remove dressing in 24 hours  As directed     Resume previous diet  As directed     may wash over wound with mild soap and water  As directed        Discharge Diagnosis:  Carotid stenosis  Secondary Diagnosis: Patient Active Problem List   Diagnosis Date Noted  . Other and unspecified hyperlipidemia 02/17/2011  . Tobacco use disorder 02/17/2011  . CAROTID ARTERY STENOSIS 11/25/2010  . CORONARY ATHEROSCLEROSIS NATIVE CORONARY ARTERY 11/11/2010  . CAROTID BRUIT 11/11/2010   Past  Medical History  Diagnosis Date  . CAD (coronary artery disease)   . HTN (hypertension)   . HLD (hyperlipidemia)   . Myocardial infarction 2001 & 2008  . Leg pain   . Chest pain   . Shortness of breath   . Carotid artery occlusion       Medication List    TAKE these medications       aspirin 81 MG tablet  Take 81 mg by mouth daily.     atorvastatin 40 MG tablet  Commonly known as:  LIPITOR  Take 1 tablet (40 mg total) by mouth daily.     carvedilol 6.25 MG tablet  Commonly known as:  COREG  Take 1 tablet (6.25 mg total) by mouth 2 (two) times daily.     clopidogrel 75 MG tablet  Commonly known as:  PLAVIX  TAKE ONE TABLET BY MOUTH EVERY DAY     LORazepam 0.5 MG tablet  Commonly known as:  ATIVAN  Take 1 tablet (0.5 mg total) by mouth as needed. Dr Shirlee Latch will not be able to provide additional refills     multivitamin tablet  Take 1 tablet by mouth daily.     nitroGLYCERIN 0.4 MG SL tablet  Commonly known as:  NITROSTAT  Place 1 tablet (0.4 mg total) under the tongue every 5 (five) minutes as needed for chest pain.     nitroGLYCERIN 0.4 MG SL tablet  Commonly known as:  NITROSTAT  Place 0.4 mg under the tongue every 5 (five) minutes as needed for chest pain.          Disposition: home  Patient's condition: is Good  Follow up: 1. Dr.  Myra Gianotti in 2 weeks.   Doreatha Massed, PA-C [facility] [phone]  --- For [facility] use --- Instructions: Press F2 to tab through selections.  Delete question if not applicable.   Modified Rankin score at D/C (0-6): 0  IV medication needed for:  1. Hypertension: No 2. Hypotension: No  Post-op Complications: No  1. Post-op CVA or TIA: No  If yes: Event classification (right eye, left eye, right cortical, left cortical, verterobasilar, other): n/a  If yes: Timing of event (intra-op, <6 hrs post-op, >=6 hrs post-op, unknown): n/a  2. CN injury: No  If yes: CN n/a injuried   3.  Myocardial infarction: No  If yes: Dx by (EKG or clinical, Troponin): n/a  4.  CHF: No  5.  Dysrhythmia (new): No  6. Wound infection: No  7. Reperfusion symptoms: No  8. Return to OR: No  If yes: return to OR for (bleeding, neurologic, other CEA incision, other): n/a  Discharge medications: Statin use:  Yes If No: [ ]  For Medical reasons, [ ]  Non-compliant, [ ]  Not-indicated ASA use:  Yes  If  No: [ ]  For Medical reasons, [ ]  Non-compliant, [ ]  Not-indicated Beta blocker use:  Yes If No: [ ]  For Medical reasons, [ ]  Non-compliant, [ ]  Not-indicated ACE-Inhibitor use:  No If No: [ ]  For Medical reasons, [ ]  Non-compliant, [x ] Not-indicated P2Y12 Antagonist use: yes, [ x] Plavix, [ ]  Plasugrel, [ ]  Ticlopinine, [ ]  Ticagrelor, [ ]  Other, [ ]  No for medical reason, [ ]  Non-compliant, [ ]  Not-indicated Anti-coagulant use:  no, [ ]  Warfarin, [ ]  Rivaroxaban, [ ]  Dabigatran, [ ]  Other, [ ]  No for medical reason, [ ]  Non-compliant, [x ] Not-indicated

## 2013-01-19 NOTE — Telephone Encounter (Addendum)
Message copied by Fredrich Birks on Wed Jan 19, 2013  1:25 PM ------      Message from: Marlowe Shores      Created: Tue Jan 18, 2013  4:27 PM       4 week F/U carotid stent - Brabham - ? Need for carotid duplex - please ask Dr. Myra Gianotti ------  Mailed letter, unable to reach patient, dpm

## 2013-01-19 NOTE — Progress Notes (Signed)
[facility] Progress Note  01/19/2013 7:41 AM POD 1  Subjective:  Ready to go home  Afebrile HR 50's-60's regular 100's-140 systolic 97% RA  Filed Vitals:   01/19/13 0700  BP: 105/57  Pulse: 62  Temp:   Resp: 21     Physical Exam: Neuro:  In tact Incision:  Right groin is soft and bandage is dry Extremities:  Bilateral palpable DP pulses; bilateral feet warm and well perfused.  CBC    Component Value Date/Time   WBC 10.2 12/06/2010 0500   RBC 4.10* 12/06/2010 0500   HGB 15.3 01/18/2013 0615   HCT 45.0 01/18/2013 0615   PLT 181 12/06/2010 0500   MCV 86.6 12/06/2010 0500   MCH 28.5 12/06/2010 0500   MCHC 33.0 12/06/2010 0500   RDW 14.6 12/06/2010 0500   LYMPHSABS 1.6 08/25/2010 1504   MONOABS 0.6 08/25/2010 1504   EOSABS 0.2 08/25/2010 1504   BASOSABS 0.0 08/25/2010 1504    BMET    Component Value Date/Time   NA 142 01/18/2013 0615   K 4.0 01/18/2013 0615   CL 107 01/18/2013 0615   CO2 30 11/12/2011 1011   GLUCOSE 105* 01/18/2013 0615   BUN 11 01/18/2013 0615   CREATININE 0.70 01/18/2013 0615   CREATININE 0.73 06/16/2011 1414   CALCIUM 9.6 11/12/2011 1011   GFRNONAA >60 12/06/2010 0500   GFRAA  Value: >60        The eGFR has been calculated using the MDRD equation. This calculation has not been validated in all clinical situations. eGFR's persistently <60 mL/min signify possible Chronic Kidney Disease. 12/06/2010 0500     Intake/Output Summary (Last 24 hours) at 01/19/13 0741 Last data filed at 01/19/13 0700  Gross per 24 hour  Intake 1141.25 ml  Output   1600 ml  Net -458.75 ml      Assessment/Plan:  This is a 52 y.o. male who is s/p 1. ultrasound-guided access, right femoral artery  2. aortic arch angiogram  3. right carotid stent with distal embolic protection  1 Day Post-Op  -pt is doing well this am. -pt neuro exam is in tact -pt has ambulated -will discharge this am.  F/u with Dr. Myra Gianotti 2 weeks.   Doreatha Massed, PA-C [facility] [phone]

## 2013-01-19 NOTE — Progress Notes (Signed)
Pt discharging home with family, instructions given, scripts given, pt and family verbalize understanding

## 2013-01-20 ENCOUNTER — Telehealth: Payer: Self-pay | Admitting: Surgery

## 2013-01-20 ENCOUNTER — Other Ambulatory Visit: Payer: Self-pay | Admitting: *Deleted

## 2013-01-20 DIAGNOSIS — Z48812 Encounter for surgical aftercare following surgery on the circulatory system: Secondary | ICD-10-CM

## 2013-01-20 NOTE — Telephone Encounter (Signed)
Message copied by Fredrich Birks on Thu Jan 20, 2013 12:50 PM ------      Message from: Phillips Odor      Created: Thu Jan 20, 2013  9:13 AM      Regarding: FW: Question                   ----- Message -----         From: Nada Libman, MD         Sent: 01/19/2013  10:12 PM           To: Conley Simmonds Pullins, RN      Subject: RE: Question                                             yes      ----- Message -----         From: Erenest Blank, RN         Sent: 01/18/2013   5:24 PM           To: Nada Libman, MD      Subject: Question                                                 Does pt. Need Carotid Duplex at 4 wk. F/u?            ----- Message -----         From: Marlowe Shores, PA-C         Sent: 01/18/2013   4:27 PM           To: Melene Plan, RN, [facility]            4 week F/U carotid stent - Brabham - ? Need for carotid duplex - please ask Dr. Myra Gianotti             ------

## 2013-01-21 NOTE — Consult Note (Signed)
Aaron Velez, Aaron Velez             ACCOUNT NO.:  [id number]  MEDICAL RECORD NO.:  [id number]  LOCATION:  3311                         FACILITY:  [facility]  PHYSICIAN:  Sarahjane Matherly K. Burech Mcfarland, M.D.DATE OF BIRTH:  Feb 16, 1961  DATE OF CONSULTATION: DATE OF DISCHARGE:  01/19/2013                                CONSULTATION   CLINICAL HISTORY:  Progressive stenosis of the right common carotid artery.  EXAMINATION:  Intracranial interpretation of right common carotid arteriogram before and after placement of right common carotid artery stent with distal protection.  The pre-stent right common carotid arteriogram demonstrates the caliber of the right internal carotid artery in the distal cervical segment to be normal.  The petrous, cavernous, and supraclinoid segments are widely patent.  The right anterior cerebral arteries opacify normally into the capillary and venous phases.  Post-stent placement common carotid arteriogram demonstrates no change in the internal carotid artery caliber in its distal cervical region. The petrous, cavernous, and the supraclinoid segments are widely patent. Suggestion of improved flow is seen on the posterior stent placement by catheter angiogram.  No obvious gross filling defects are seen within the intracranial circulation.  IMPRESSION:  Pre and post stent placement, right common carotid arteriograms demonstrate no significant change or the presence of __________ along the filling defects on the images provided.          ______________________________ Grandville Silos Corliss Skains, M.D.     SKD/MEDQ  D:  01/20/2013  T:  01/21/2013  Job:  [id number]

## 2013-01-24 ENCOUNTER — Telehealth: Payer: Self-pay | Admitting: *Deleted

## 2013-01-24 NOTE — Telephone Encounter (Signed)
Patient called to review postop restrictions. Per Dr. Myra Gianotti, patient may drive short distances and lift objects up to 10-15 lbs for the next 2 weeks: After this there are no restrictions. Aaron Velez reports that he is afebrile, has no redness or swelling at groin puncture site and no pain. He is eating and swallowing with no difficulty and is having no problems with elimination. He has a postop appt on 02-07-13 and will call if he has any problems prior to that. Patient voiced understanding of this postop plan.

## 2013-01-25 NOTE — Discharge Summary (Signed)
I agree with the above  Aaron Velez 

## 2013-02-04 ENCOUNTER — Encounter: Payer: Self-pay | Admitting: Surgery

## 2013-02-07 ENCOUNTER — Ambulatory Visit (INDEPENDENT_AMBULATORY_CARE_PROVIDER_SITE_OTHER): Payer: Medicare Other | Admitting: Surgery

## 2013-02-07 ENCOUNTER — Other Ambulatory Visit: Payer: Self-pay | Admitting: *Deleted

## 2013-02-07 ENCOUNTER — Other Ambulatory Visit (INDEPENDENT_AMBULATORY_CARE_PROVIDER_SITE_OTHER): Payer: Medicare Other | Admitting: *Deleted

## 2013-02-07 ENCOUNTER — Encounter: Payer: Self-pay | Admitting: Surgery

## 2013-02-07 DIAGNOSIS — Z48812 Encounter for surgical aftercare following surgery on the circulatory system: Secondary | ICD-10-CM

## 2013-02-07 DIAGNOSIS — I6529 Occlusion and stenosis of unspecified carotid artery: Secondary | ICD-10-CM

## 2013-02-07 NOTE — Progress Notes (Signed)
The patient is back today for followup. He is status post right carotid stent on 01/18/2013. He has previously undergone right carotid endarterectomy on 12/05/2010. This still was placed without I. He has no complaints today. He did undergo a duplex today which shows a widely patent right carotid. Ultrasound also shows a velocity elevation within the common carotid artery on the left. This was defined as about 65% on angiography. I discussed this with the patient today. I recommended followup in 6 months with a repeat carotid ultrasound.

## 2013-02-14 ENCOUNTER — Other Ambulatory Visit: Payer: Medicare Other

## 2013-02-14 ENCOUNTER — Ambulatory Visit: Payer: Medicare Other | Admitting: Surgery

## 2013-03-21 ENCOUNTER — Emergency Department (HOSPITAL_COMMUNITY)
Admission: EM | Admit: 2013-03-21 | Discharge: 2013-03-21 | Disposition: A | Discharge status: Home / Self Care | Payer: Medicare Other | Attending: Emergency Medicine | Admitting: Emergency Medicine

## 2013-03-21 ENCOUNTER — Emergency Department (HOSPITAL_COMMUNITY): Payer: Medicare Other

## 2013-03-21 ENCOUNTER — Encounter (HOSPITAL_COMMUNITY): Payer: Self-pay | Admitting: *Deleted

## 2013-03-21 DIAGNOSIS — Y9239 Other specified sports and athletic area as the place of occurrence of the external cause: Secondary | ICD-10-CM | POA: Insufficient documentation

## 2013-03-21 DIAGNOSIS — S335XXD Sprain of ligaments of lumbar spine, subsequent encounter: Secondary | ICD-10-CM

## 2013-03-21 DIAGNOSIS — S335XXA Sprain of ligaments of lumbar spine, initial encounter: Secondary | ICD-10-CM | POA: Insufficient documentation

## 2013-03-21 DIAGNOSIS — I252 Old myocardial infarction: Secondary | ICD-10-CM | POA: Insufficient documentation

## 2013-03-21 DIAGNOSIS — Z7982 Long term (current) use of aspirin: Secondary | ICD-10-CM | POA: Insufficient documentation

## 2013-03-21 DIAGNOSIS — I10 Essential (primary) hypertension: Secondary | ICD-10-CM | POA: Insufficient documentation

## 2013-03-21 DIAGNOSIS — Z79899 Other long term (current) drug therapy: Secondary | ICD-10-CM | POA: Insufficient documentation

## 2013-03-21 DIAGNOSIS — I2510 Atherosclerotic heart disease of native coronary artery without angina pectoris: Secondary | ICD-10-CM | POA: Insufficient documentation

## 2013-03-21 DIAGNOSIS — Z8679 Personal history of other diseases of the circulatory system: Secondary | ICD-10-CM | POA: Insufficient documentation

## 2013-03-21 DIAGNOSIS — E785 Hyperlipidemia, unspecified: Secondary | ICD-10-CM | POA: Insufficient documentation

## 2013-03-21 DIAGNOSIS — S239XXA Sprain of unspecified parts of thorax, initial encounter: Secondary | ICD-10-CM | POA: Insufficient documentation

## 2013-03-21 DIAGNOSIS — X500XXA Overexertion from strenuous movement or load, initial encounter: Secondary | ICD-10-CM | POA: Insufficient documentation

## 2013-03-21 DIAGNOSIS — Z7902 Long term (current) use of antithrombotics/antiplatelets: Secondary | ICD-10-CM | POA: Insufficient documentation

## 2013-03-21 DIAGNOSIS — Y9368 Activity, volleyball (beach) (court): Secondary | ICD-10-CM | POA: Insufficient documentation

## 2013-03-21 DIAGNOSIS — F17200 Nicotine dependence, unspecified, uncomplicated: Secondary | ICD-10-CM | POA: Insufficient documentation

## 2013-03-21 DIAGNOSIS — Z9861 Coronary angioplasty status: Secondary | ICD-10-CM | POA: Insufficient documentation

## 2013-03-21 DIAGNOSIS — Z880 Allergy status to penicillin: Secondary | ICD-10-CM | POA: Insufficient documentation

## 2013-03-21 MED ORDER — DIAZEPAM 5 MG PO TABS
5.0000 mg | ORAL_TABLET | Freq: Once | ORAL | Status: AC
  Administered 2013-03-21: 5 mg via ORAL
  Filled 2013-03-21: qty 1

## 2013-03-21 MED ORDER — NAPROXEN 500 MG PO TABS
500.0000 mg | ORAL_TABLET | Freq: Once | ORAL | Status: AC
Start: 2013-03-21 — End: 2013-03-21
  Administered 2013-03-21: 500 mg via ORAL
  Filled 2013-03-21: qty 1

## 2013-03-21 MED ORDER — CYCLOBENZAPRINE HCL 10 MG PO TABS: 10.0000 mg | ORAL_TABLET | Freq: Two times a day (BID) | ORAL | Status: DC | PRN

## 2013-03-21 MED ORDER — NAPROXEN 500 MG PO TABS: 500.0000 mg | ORAL_TABLET | Freq: Two times a day (BID) | ORAL | Status: DC

## 2013-03-21 NOTE — ED Provider Notes (Signed)
History  This chart was scribed for Aaron Mutton, PA-C working with Enid Skeens, MD by Greggory Stallion, ED scribe. This patient was seen in room WTR5/WTR5 and the patient's care was started at 3:20 PM.  CSN: [id number] Arrival date & time 03/21/13  1501   Chief Complaint  Patient presents with  . Back Pain   The history is provided by the patient. No language interpreter was used.    HPI Comments: Aaron Velez is a 52 y.o. male who presents to the Emergency Department complaining of sudden onset, constant aching back pain that starts from his neck down to his tailbone that started yesterday around 5 PM. He rates his pain 6/10. Pt states he was playing volleyball yesterday when he jumped up and landed flat on his back and felt a sharp pain shoot down his back. He states he landed on dry sand. Pt states when he moves certain ways the pain worsens and he gets a shooting pain in his back. Pt denies head injury or LOC. Pt states the pain is not better with rest. Pt states he has used Tylenol with no relief. Pt denies neck pain, visual disturbance, bowel and urinary incontinence, numbness, and tingling as associated symptoms. Pt states he has never had any back problems, injuries, or surgeries. Pt states he does not need any pain medication right now.   Past Medical History  Diagnosis Date  . CAD (coronary artery disease)   . HTN (hypertension)   . HLD (hyperlipidemia)   . Myocardial infarction 2001 & 2008  . Leg pain   . Chest pain   . Shortness of breath   . Carotid artery occlusion    Past Surgical History  Procedure Laterality Date  . Carotid endarterectomy  12/05/2010    right CEA  . Coronary angioplasty with stent placement  2001,2007,2008  . Finger surgery      For brown recluse spider bite   Family History  Problem Relation Age of Onset  . Hypertension    . Coronary artery disease     History  Substance Use Topics  . Smoking status: Current Every Day Smoker --  1.00 packs/day for 40 years    Types: Cigarettes  . Smokeless tobacco: Former Neurosurgeon    Types: Chew  . Alcohol Use: No    Review of Systems  HENT: Negative for neck pain.   Eyes: Negative for visual disturbance.  Musculoskeletal: Positive for back pain.  Neurological: Negative for numbness.  All other systems reviewed and are negative.    Allergies  Penicillins  Home Medications   Current Outpatient Rx  Name  Route  Sig  Dispense  Refill  . aspirin 81 MG tablet   Oral   Take 81 mg by mouth daily.           Marland Kitchen atorvastatin (LIPITOR) 40 MG tablet   Oral   Take 1 tablet (40 mg total) by mouth daily.   30 tablet   5     Dosage increase as of 09-20-12 per Dr Shirlee Latch   . carvedilol (COREG) 6.25 MG tablet   Oral   Take 1 tablet (6.25 mg total) by mouth 2 (two) times daily.   180 tablet   3   . clopidogrel (PLAVIX) 75 MG tablet      TAKE ONE TABLET BY MOUTH EVERY DAY   30 tablet   5   . LORazepam (ATIVAN) 0.5 MG tablet   Oral   Take 1 tablet (  0.5 mg total) by mouth as needed. Dr Shirlee Latch will not be able to provide additional refills   30 tablet   0   . nitroGLYCERIN (NITROSTAT) 0.4 MG SL tablet   Sublingual   Place 0.4 mg under the tongue every 5 (five) minutes as needed for chest pain.         . cyclobenzaprine (FLEXERIL) 10 MG tablet   Oral   Take 1 tablet (10 mg total) by mouth 2 (two) times daily as needed for muscle spasms.   20 tablet   0   . Multiple Vitamin (MULTIVITAMIN) tablet   Oral   Take 1 tablet by mouth daily.           . naproxen (NAPROSYN) 500 MG tablet   Oral   Take 1 tablet (500 mg total) by mouth 2 (two) times daily.   30 tablet   0   . EXPIRED: nitroGLYCERIN (NITROSTAT) 0.4 MG SL tablet   Sublingual   Place 1 tablet (0.4 mg total) under the tongue every 5 (five) minutes as needed for chest pain.   25 tablet   11    BP 126/75  Pulse 69  Temp(Src) 98.4 F (36.9 C) (Oral)  Resp 18  Ht 5\' 10"  (1.778 m)  Wt 140 lb (63.504  kg)  BMI 20.09 kg/m2  SpO2 100%  Physical Exam  Nursing note and vitals reviewed. Constitutional: He is oriented to person, place, and time. He appears well-developed and well-nourished. No distress.  HENT:  Head: Normocephalic and atraumatic.  Negative facial and head pain upon palpation.   Eyes: Conjunctivae and EOM are normal. Pupils are equal, round, and reactive to light. Right eye exhibits no discharge. Left eye exhibits no discharge.  Neck: Normal range of motion. Neck supple. No tracheal deviation present.  Negative neck stiffness. No cervical spine tenderness.   Cardiovascular: Normal rate, regular rhythm and normal heart sounds.   No murmur heard. Radial pulses 2+ bilaterally. DP pulses 2+ bilaterally.  Pulmonary/Chest: Effort normal and breath sounds normal. No respiratory distress. He has no wheezes. He has no rales. He exhibits no tenderness.  Good chest expansion.   Musculoskeletal: Normal range of motion.       Back:  No deformities noted to back. No masses. No lesions. Pain upon palpation starting from T6 down to lumbar sacral. Full flexion and extension of back. Full ROM to upper and lower extremities bilaterally. Strength 5+/5+ with resistance.   Neurological: He is alert and oriented to person, place, and time.  Gait proper, proper balance. Sensation intact with differentiation to sharp and dull touch to upper and lower extremities bilaterally.     Skin: Skin is warm and dry.  Psychiatric: He has a normal mood and affect. His behavior is normal.    ED Course  Procedures (including critical care time)  Medications  naproxen (NAPROSYN) tablet 500 mg (500 mg Oral Given 03/21/13 1716)  diazepam (VALIUM) tablet 5 mg (5 mg Oral Given 03/21/13 1716)    DIAGNOSTIC STUDIES: Oxygen Saturation is 100% on RA, normal by my interpretation.    COORDINATION OF CARE: 4:33 PM-Discussed treatment plan which includes thoracic and lumbar spine xray with pt at bedside and pt  agreed to plan.   Labs Reviewed - No data to display Dg Thoracic Spine 2 View  03/21/2013   *RADIOLOGY REPORT*  Clinical Data: Larey Seat, mid back pain  THORACIC SPINE - 2 VIEW  Comparison:  None.  Findings:  There is no evidence  of thoracic spine fracture. Alignment is normal.  No other significant bone abnormalities are identified.  IMPRESSION: Negative.   Original Report Authenticated By: Davonna Belling, M.D.   Dg Lumbar Spine Complete  03/21/2013   *RADIOLOGY REPORT*  Clinical Data: Larey Seat, pain  LUMBAR SPINE - COMPLETE 4+ VIEW  Comparison: CT abdomen 01/30/2007.  Findings: Mild endplate softening is a chronic finding.  No acute compression fracture or traumatic subluxation. Mild facet arthropathy.  Mild vascular calcification.  No worrisome osseous lesions.  IMPRESSION: No acute lumbar spine findings.   Original Report Authenticated By: Davonna Belling, M.D.   1. Thoracic sprain, initial encounter   2. Lumbar sprain, subsequent encounter     MDM  I personally performed the services described in this documentation, which was scribed in my presence. The recorded information has been reviewed and is accurate.  KAELON WEEKES is a 52 y/o M presenting to the ED with back pain after falling on back while playing volleyball - landed on sand. Negative findings on imaging negative fractures and dislocations. Patient able to walk with normal and balanced gait. 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 Negative deformities noted to back. Full ROM to the back. Pain upon palpation to the the thoracic region. Patient stable, afebrile. Doubt cauda equina. Negative acute injuries noted. Discharged patient. Referred [facility]. Discharged patient with anti-inflammatories and muscle relaxer. Discussed with patient to  rest and stay hydrated. Discussed with patient to sleep in propped up position with pillow under lumbar spine. Discussed with patient to apply heat. Discussed with patient to continue to monitor symptoms and if symptoms are to worsen or change to report back to the ED - strict return instructions given. Patient agreed to plan of care, understood, all questions answered.  Aaron Mutton, PA-C 03/22/13 1615

## 2013-03-21 NOTE — ED Notes (Signed)
Pt states when he landed after jumping up he landed on his back not his feet

## 2013-03-21 NOTE — ED Notes (Signed)
Pt states was playing volleyball yesterday when he jumped up and when he landed a sharp pain shot down back, pt states it's a constant sharp pain from neck down to tail bone.

## 2013-03-23 NOTE — ED Provider Notes (Signed)
Medical screening examination/treatment/procedure(s) were conducted as a shared visit with non-physician practitioner(s) or resident  and myself.  I personally evaluated the patient during the encounter and agree with the findings and plan unless otherwise indicated.  Mechanical injury. Lower thoracic midline pain on ED.  Fup discussed.    Enid Skeens, MD 03/23/13 [phone]

## 2013-04-20 ENCOUNTER — Other Ambulatory Visit: Payer: Self-pay | Admitting: Cardiology

## 2013-06-19 ENCOUNTER — Encounter: Payer: Self-pay | Admitting: Cardiology

## 2013-06-21 ENCOUNTER — Other Ambulatory Visit: Payer: Self-pay | Admitting: *Deleted

## 2013-06-21 MED ORDER — CLOPIDOGREL BISULFATE 75 MG PO TABS: 75.0000 mg | ORAL_TABLET | Freq: Every day | ORAL | Status: DC

## 2013-06-21 NOTE — Telephone Encounter (Signed)
This encounter was created in error - please disregard.

## 2013-06-29 ENCOUNTER — Other Ambulatory Visit: Payer: Self-pay | Admitting: Cardiology

## 2013-08-08 ENCOUNTER — Ambulatory Visit (INDEPENDENT_AMBULATORY_CARE_PROVIDER_SITE_OTHER): Payer: Medicare Other | Admitting: Cardiology

## 2013-08-08 ENCOUNTER — Encounter: Payer: Self-pay | Admitting: Cardiology

## 2013-08-08 VITALS — BP 115/60 | HR 59 | Ht 69.0 in | Wt 136.0 lb

## 2013-08-08 DIAGNOSIS — F17200 Nicotine dependence, unspecified, uncomplicated: Secondary | ICD-10-CM

## 2013-08-08 DIAGNOSIS — E785 Hyperlipidemia, unspecified: Secondary | ICD-10-CM

## 2013-08-08 DIAGNOSIS — I2510 Atherosclerotic heart disease of native coronary artery without angina pectoris: Secondary | ICD-10-CM

## 2013-08-08 DIAGNOSIS — I6529 Occlusion and stenosis of unspecified carotid artery: Secondary | ICD-10-CM

## 2013-08-08 MED ORDER — RAMIPRIL 2.5 MG PO CAPS: 2.5000 mg | ORAL_CAPSULE | Freq: Every day | ORAL | Status: DC

## 2013-08-08 NOTE — Patient Instructions (Signed)
Start ramipril 2.5mg  daily.  Your physician recommends that you return for a FASTING lipid profile /BMET in 2 weeks.   Your physician wants you to follow-up in: 6 months with Dr Shirlee Latch. (June 2015).  You will receive a reminder letter in the mail two months in advance. If you don't receive a letter, please call our office to schedule the follow-up appointment.

## 2013-08-09 NOTE — Progress Notes (Signed)
Patient ID: Aaron Velez, male   DOB: [birth year], 52 y.o.   MRN: [id number] PCP: Dr. Knox Royalty  52 yo with history of CAD and smoking presents for cardiology followup.  He had BMS to the RCA in 2001, BMS to the CFX in 2007, then had stent thrombosis in the RCA stent in 5/08 with inferior STEMI.  No exertional chest pain and no nitroglycerin use.  No exertional dyspnea.  He walks his dog daily.  He continues to smoke 1 ppd.  No claudication symptoms, no stroke-like symptoms.  In 5/14, he had a right carotid stent placed.    Labs (10/12): creatinine 0.73 Labs (3/13): K 4.2, creatinine 0.9, LDL 115, HDL 37 Labs (3/14): LDL 66, HDL 25, LFTs normal  ECG: NSR, normal  PMH: 1. CAD: BMS to RCA in 2001.  Unstable angina with BMS to CFX in 8/07.  Inferior MI in 5/08 due to stent thrombosis in RCA, had PTCA.  EF 60% with inferior hypokinesis on 5/08 cath.  2. Erb's palsy 3. HTN 4. Anxiety 5. Carotid stenosis: right CEA in 3/12.  Carotid dopplers (4/13) with 40-59% RICA stenosis.  Right carotid stent placed in 5/14.  6. Hyperlipidemia 7. Active smoker: Failed Chantix and Wellbutrin.  8. Rotator cuff strain/tear   SH: Married, lives in [place].  Smokes 1 ppd.  On disability.   FH: CAD  ROS: All systems reviewed and negative except as per HPI.   Current Outpatient Prescriptions  Medication Sig Dispense Refill  . aspirin 81 MG tablet Take 81 mg by mouth daily.        Marland Kitchen atorvastatin (LIPITOR) 40 MG tablet Take 1 tablet (40 mg total) by mouth daily.  30 tablet  5  . carvedilol (COREG) 6.25 MG tablet TAKE ONE TABLET BY MOUTH TWICE DAILY-- PT NEEDS TO SCGEDULE APPT CALL [phone]  60 tablet  1  . clopidogrel (PLAVIX) 75 MG tablet Take 1 tablet (75 mg total) by mouth daily.  30 tablet  1  . LORazepam (ATIVAN) 0.5 MG tablet Take 1 tablet (0.5 mg total) by mouth as needed. Dr Shirlee Latch will not be able to provide additional refills  30 tablet  0  . nitroGLYCERIN (NITROSTAT) 0.4 MG SL tablet Place  1 tablet (0.4 mg total) under the tongue every 5 (five) minutes as needed for chest pain.  25 tablet  11  . ramipril (ALTACE) 2.5 MG capsule Take 1 capsule (2.5 mg total) by mouth daily.  30 capsule  6   No current facility-administered medications for this visit.    BP 115/60  Pulse 59  Ht 5\' 9"  (1.753 m)  Wt 136 lb (61.689 kg)  BMI 20.07 kg/m2 General: NAD, thin Neck: No JVD, no thyromegaly or thyroid nodule.  Lungs: Mildly decreased breath sounds throughout.  CV: Nondisplaced PMI.  Heart regular S1/S2, no S3/S4, 1/6 SEM.  No peripheral edema.  Bilateral carotid bruits.  Normal pedal pulses.  Abdomen: Soft, nontender, no hepatosplenomegaly, no distention.  Neurologic: Alert and oriented x 3.  Psych: Normal affect. Extremities: No clubbing or cyanosis.   Assessment/Plan: 1. CAD: Stable with no ischemic symptoms.  Continue ASA, statin, Coreg.  I will have him continue Plavix given history of stent thrombosis. It would be reasonable for him to take an ACEI for secondary prevention.  I will start him on ramipril 2.5 mg daily with BMET in 2 wks.  2. Carotid stenosis: Followed at [facility]. He had right carotid stent placed in 5/14.  3. Hyperlipidemia:  Goal LDL < 70.  Check lipids. 4. Smoking: Still smoking 1 ppd.  I strongly encouraged him to quit.   [name] 08/09/2013

## 2013-08-15 ENCOUNTER — Ambulatory Visit: Payer: Medicare Other | Admitting: Surgery

## 2013-08-15 ENCOUNTER — Other Ambulatory Visit (HOSPITAL_COMMUNITY): Payer: Medicare Other

## 2013-08-22 ENCOUNTER — Other Ambulatory Visit (INDEPENDENT_AMBULATORY_CARE_PROVIDER_SITE_OTHER): Payer: Medicare Other

## 2013-08-22 DIAGNOSIS — I2510 Atherosclerotic heart disease of native coronary artery without angina pectoris: Secondary | ICD-10-CM

## 2013-08-22 DIAGNOSIS — E785 Hyperlipidemia, unspecified: Secondary | ICD-10-CM

## 2013-08-22 LAB — BASIC METABOLIC PANEL
BUN: 10 mg/dL (ref 6–23)
CO2: 29 mEq/L (ref 19–32)
Calcium: 9.1 mg/dL (ref 8.4–10.5)
Chloride: 106 mEq/L (ref 96–112)
Creatinine, Ser: 0.9 mg/dL (ref 0.4–1.5)

## 2013-08-22 LAB — LIPID PANEL
LDL Cholesterol: 72 mg/dL (ref 0–99)
Total CHOL/HDL Ratio: 4

## 2013-08-26 ENCOUNTER — Other Ambulatory Visit: Payer: Self-pay | Admitting: Cardiology

## 2013-09-05 ENCOUNTER — Ambulatory Visit: Payer: Medicare Other | Admitting: Surgery

## 2013-09-05 ENCOUNTER — Other Ambulatory Visit (HOSPITAL_COMMUNITY): Payer: Medicare Other

## 2013-09-06 ENCOUNTER — Encounter: Payer: Self-pay | Admitting: Family

## 2013-09-07 ENCOUNTER — Encounter: Payer: Self-pay | Admitting: Family

## 2013-09-07 ENCOUNTER — Ambulatory Visit (INDEPENDENT_AMBULATORY_CARE_PROVIDER_SITE_OTHER): Payer: Medicare Other | Admitting: Family

## 2013-09-07 ENCOUNTER — Ambulatory Visit (HOSPITAL_COMMUNITY)
Admission: RE | Admit: 2013-09-07 | Discharge: 2013-09-07 | Disposition: A | Discharge status: Home / Self Care | Payer: Medicare Other | Source: Ambulatory Visit | Attending: Family | Admitting: Family

## 2013-09-07 DIAGNOSIS — Z48812 Encounter for surgical aftercare following surgery on the circulatory system: Secondary | ICD-10-CM | POA: Insufficient documentation

## 2013-09-07 DIAGNOSIS — I6529 Occlusion and stenosis of unspecified carotid artery: Secondary | ICD-10-CM | POA: Insufficient documentation

## 2013-09-07 NOTE — Progress Notes (Addendum)
Established Carotid Patient   History of Present Illness  Aaron Velez is a 52 y.o. male patient of Dr. Myra Gianotti who is status post right carotid stent on 01/18/2013. He has previously undergone right carotid endarterectomy on 12/05/2010.  Carotid artery duplex in June, 2014 showed a widely patent right carotid and a velocity elevation within the common carotid artery on the left. This was defined as about 65% on angiography. He returns today for carotid artery surveillance. He is not aware of any history of stroke or TIA. He denies claudication symptoms, denies non-healing wounds, denies steal symptoms in either arm. Congenital Erb's Palsy affecting left arm.  Denies New Medical or Surgical History:  Pt Diabetic: No Pt smoker: smoker  (1 ppd x 30 yrs)  Pt meds include: Statin : Yes Betablocker: Yes ASA: Yes Other anticoagulants/antiplatelets: Plavix   Past Medical History  Diagnosis Date  . CAD (coronary artery disease)   . HTN (hypertension)   . HLD (hyperlipidemia)   . Myocardial infarction 2001 & 2008  . Leg pain   . Chest pain   . Shortness of breath   . Carotid artery occlusion     Social History History  Substance Use Topics  . Smoking status: Current Every Day Smoker -- 1.00 packs/day for 40 years    Types: Cigarettes  . Smokeless tobacco: Former Neurosurgeon    Types: Chew  . Alcohol Use: No    Family History Family History  Problem Relation Age of Onset  . Hypertension    . Coronary artery disease      Surgical History Past Surgical History  Procedure Laterality Date  . Carotid endarterectomy  12/05/2010    right CEA  . Coronary angioplasty with stent placement  2001,2007,2008  . Finger surgery      For brown recluse spider bite    Allergies  Allergen Reactions  . Penicillins Rash    Burning pain and rash    Current Outpatient Prescriptions  Medication Sig Dispense Refill  . aspirin 81 MG tablet Take 81 mg by mouth daily.        Marland Kitchen  atorvastatin (LIPITOR) 40 MG tablet Take 1 tablet (40 mg total) by mouth daily.  30 tablet  5  . carvedilol (COREG) 6.25 MG tablet TAKE ONE TABLET BY MOUTH TWICE DAILY-- PT NEEDS TO SCGEDULE APPT CALL [phone]  60 tablet  1  . clopidogrel (PLAVIX) 75 MG tablet Take 1 tablet (75 mg total) by mouth daily.  30 tablet  5  . LORazepam (ATIVAN) 0.5 MG tablet Take 1 tablet (0.5 mg total) by mouth as needed. Dr Shirlee Latch will not be able to provide additional refills  30 tablet  0  . nitroGLYCERIN (NITROSTAT) 0.4 MG SL tablet Place 1 tablet (0.4 mg total) under the tongue every 5 (five) minutes as needed for chest pain.  25 tablet  11  . ramipril (ALTACE) 2.5 MG capsule Take 1 capsule (2.5 mg total) by mouth daily.  30 capsule  6   No current facility-administered medications for this visit.    Review of Systems : See HPI for pertinent positives and negatives.  Physical Examination  Filed Vitals:   09/07/13 1510  BP: 106/63  Pulse: 60  Resp:    Filed Weights   09/07/13 1509  Weight: 140 lb (63.504 kg)   Body mass index is 20.67 kg/(m^2).   General: WDWN male in NAD GAIT: normal Eyes: PERRLA Pulmonary:  CTAB, Negative  Rales, Negative rhonchi, & Negative wheezing.  Cardiac: regular Rhythm ,  Negative Murmurs.  VASCULAR EXAM Carotid Bruits Left Right   Positive Negative     Radial pulses are 2+ palpable and equal.                                                                                                                            LE Pulses LEFT RIGHT       POPLITEAL   palpable    palpable       POSTERIOR TIBIAL   palpable    palpable        DORSALIS PEDIS      ANTERIOR TIBIAL  palpable   palpable     Gastrointestinal: soft, nontender, BS WNL, no r/g,  negative masses.  Musculoskeletal: Negative muscle atrophy/wasting. M/S 5/5 throughout except unable to raise left arm to shoulder level, 2/5 M/S LUE, Extremities without ischemic changes.  Neurologic: A&O X 3;  Appropriate Affect ; SENSATION ;normal;  Speech is normal CN 2-12 intact, Pain and light touch intact in extremities, Motor exam as listed above.   Non-Invasive Vascular Imaging CAROTID DUPLEX 09/07/2013   Right ICA: patent CEA/stent site. Left ICA: <40% stenosis. Patent right CCA stent. Elevated velocities involving the proximal left CCA suggestive of <50% stenosis (PSV decreased to 202 cm/sec this visit compared to 02/07/13 at which time the proximal left CCA PSV was 393 cm/sec)  These findings are Improved in the left proximal CCA from previous exam.  Assessment: Aaron Velez is a 52 y.o. male who presents with asymptomatic patent right ICA which is the CEA/stent site,  <40%  Left ICA stenosis. The  ICA stenosis is  Improved  in the left proximal CCA from previous exam.  Plan: Follow-up in 6 months with Carotid Duplex scan.   I discussed in depth with the patient the nature of atherosclerosis, and emphasized the importance of maximal medical management including strict control of blood pressure, blood glucose, and lipid levels, obtaining regular exercise, and cessation of smoking.  The patient is aware that without maximal medical management the underlying atherosclerotic disease process will progress, limiting the benefit of any interventions. The patient was given information about stroke prevention and what symptoms should prompt the patient to seek immediate medical care. He was counseled re smoking cessation. Thank you for allowing [place] to participate in this patient's care.  Charisse March, RN, MSN, FNP-C [facility] Office: [phone]  Clinic Physician: Edilia Bo on call  09/07/2013 3:19 PM

## 2013-09-07 NOTE — Patient Instructions (Signed)
Stroke Prevention Some medical conditions and behaviors are associated with an increased chance of having a stroke. You may prevent a stroke by making healthy choices and managing medical conditions. Reduce your risk of having a stroke by:  Staying physically active. Get at least 30 minutes of activity on most or all days.  Not smoking. It may also be helpful to avoid exposure to secondhand smoke.  Limiting alcohol use. Moderate alcohol use is considered to be:  No more than 2 drinks per day for men.  No more than 1 drink per day for nonpregnant women.  Eating healthy foods.  Include 5 or more servings of fruits and vegetables a day.  Certain diets may be prescribed to address high blood pressure, high cholesterol, diabetes, or obesity.  Managing your cholesterol levels.  A low-saturated fat, low-trans fat, low-cholesterol, and high-fiber diet may control cholesterol levels.  Take any prescribed medicines to control cholesterol as directed by your caregiver.  Managing your diabetes.  A controlled-carbohydrate, controlled-sugar diet is recommended to manage diabetes.  Take any prescribed medicines to control diabetes as directed by your caregiver.  Controlling your high blood pressure (hypertension).  A low-salt (sodium), low-saturated fat, low-trans fat, and low-cholesterol diet is recommended to manage high blood pressure.  Take any prescribed medicines to control hypertension as directed by your caregiver.  Maintaining a healthy weight.  A reduced-calorie, low-sodium, low-saturated fat, low-trans fat, low-cholesterol diet is recommended to manage weight.  Stopping drug abuse.  Avoiding birth control pills.  Talk to your caregiver about the risks of taking birth control pills if you are over 35 years old, smoke, get migraines, or have ever had a blood clot.  Getting evaluated for sleep disorders (sleep apnea).  Talk to your caregiver about getting a sleep evaluation  if you snore a lot or have excessive sleepiness.  Taking medicines as directed by your caregiver.  For some people, aspirin or blood thinners (anticoagulants) are helpful in reducing the risk of forming abnormal blood clots that can lead to stroke. If you have the irregular heart rhythm of atrial fibrillation, you should be on a blood thinner unless there is a good reason you cannot take them.  Understand all your medicine instructions. SEEK IMMEDIATE MEDICAL CARE IF:   You have sudden weakness or numbness of the face, arm, or leg, especially on one side of the body.  You have sudden confusion.  You have trouble speaking (aphasia) or understanding.  You have sudden trouble seeing in one or both eyes.  You have sudden trouble walking.  You have dizziness.  You have a loss of balance or coordination.  You have a sudden, severe headache with no known cause.  You have new chest pain or an irregular heartbeat. Any of these symptoms may represent a serious problem that is an emergency. Do not wait to see if the symptoms will go away. Get medical help right away. Call your local emergency services (911 in [place] Do not drive yourself to the hospital. Document Released: 10/02/2004 Document Revised: 11/17/2011 Document Reviewed: 02/25/2013 Exit[employer] Patient Information 2014 [employer].  Smoking Cessation Quitting smoking is important to your health and has many advantages. However, it is not always easy to quit since nicotine is a very addictive drug. Often times, people try 3 times or more before being able to quit. This document explains the best ways for you to prepare to quit smoking. Quitting takes hard work and a lot of effort, but you can do it.   ADVANTAGES OF QUITTING SMOKING  You will live longer, feel better, and live better.  Your body will feel the impact of quitting smoking almost immediately.  Within 20 minutes, blood pressure decreases. Your pulse returns to its normal  level.  After 8 hours, carbon monoxide levels in the blood return to normal. Your oxygen level increases.  After 24 hours, the chance of having a heart attack starts to decrease. Your breath, hair, and body stop smelling like smoke.  After 48 hours, damaged nerve endings begin to recover. Your sense of taste and smell improve.  After 72 hours, the body is virtually free of nicotine. Your bronchial tubes relax and breathing becomes easier.  After 2 to 12 weeks, lungs can hold more air. Exercise becomes easier and circulation improves.  The risk of having a heart attack, stroke, cancer, or lung disease is greatly reduced.  After 1 year, the risk of coronary heart disease is cut in half.  After 5 years, the risk of stroke falls to the same as a nonsmoker.  After 10 years, the risk of lung cancer is cut in half and the risk of other cancers decreases significantly.  After 15 years, the risk of coronary heart disease drops, usually to the level of a nonsmoker.  If you are pregnant, quitting smoking will improve your chances of having a healthy baby.  The people you live with, especially any children, will be healthier.  You will have extra money to spend on things other than cigarettes. QUESTIONS TO THINK ABOUT BEFORE ATTEMPTING TO QUIT You may want to talk about your answers with your caregiver.  Why do you want to quit?  If you tried to quit in the past, what helped and what did not?  What will be the most difficult situations for you after you quit? How will you plan to handle them?  Who can help you through the tough times? Your family? Friends? A caregiver?  What pleasures do you get from smoking? What ways can you still get pleasure if you quit? Here are some questions to ask your caregiver:  How can you help me to be successful at quitting?  What medicine do you think would be best for me and how should I take it?  What should I do if I need more help?  What is  smoking withdrawal like? How can I get information on withdrawal? GET READY  Set a quit date.  Change your environment by getting rid of all cigarettes, ashtrays, matches, and lighters in your home, car, or work. Do not let people smoke in your home.  Review your past attempts to quit. Think about what worked and what did not. GET SUPPORT AND ENCOURAGEMENT You have a better chance of being successful if you have help. You can get support in many ways.  Tell your family, friends, and co-workers that you are going to quit and need their support. Ask them not to smoke around you.  Get individual, group, or telephone counseling and support. Programs are available at local hospitals and health centers. Call your local health department for information about programs in your area.  Spiritual beliefs and practices may help some smokers quit.  Download a "quit meter" on your computer to keep track of quit statistics, such as how long you have gone without smoking, cigarettes not smoked, and money saved.  Get a self-help book about quitting smoking and staying off of tobacco. LEARN NEW SKILLS AND BEHAVIORS  Distract yourself from   urges to smoke. Talk to someone, go for a walk, or occupy your time with a task.  Change your normal routine. Take a different route to work. Drink tea instead of coffee. Eat breakfast in a different place.  Reduce your stress. Take a hot bath, exercise, or read a book.  Plan something enjoyable to do every day. Reward yourself for not smoking.  Explore interactive web-based programs that specialize in helping you quit. GET MEDICINE AND USE IT CORRECTLY Medicines can help you stop smoking and decrease the urge to smoke. Combining medicine with the above behavioral methods and support can greatly increase your chances of successfully quitting smoking.  Nicotine replacement therapy helps deliver nicotine to your body without the negative effects and risks of smoking.  Nicotine replacement therapy includes nicotine gum, lozenges, inhalers, nasal sprays, and skin patches. Some may be available over-the-counter and others require a prescription.  Antidepressant medicine helps people abstain from smoking, but how this works is unknown. This medicine is available by prescription.  Nicotinic receptor partial agonist medicine simulates the effect of nicotine in your brain. This medicine is available by prescription. Ask your caregiver for advice about which medicines to use and how to use them based on your health history. Your caregiver will tell you what side effects to look out for if you choose to be on a medicine or therapy. Carefully read the information on the package. Do not use any other product containing nicotine while using a nicotine replacement product.  RELAPSE OR DIFFICULT SITUATIONS Most relapses occur within the first 3 months after quitting. Do not be discouraged if you start smoking again. Remember, most people try several times before finally quitting. You may have symptoms of withdrawal because your body is used to nicotine. You may crave cigarettes, be irritable, feel very hungry, cough often, get headaches, or have difficulty concentrating. The withdrawal symptoms are only temporary. They are strongest when you first quit, but they will go away within 10 14 days. To reduce the chances of relapse, try to:  Avoid drinking alcohol. Drinking lowers your chances of successfully quitting.  Reduce the amount of caffeine you consume. Once you quit smoking, the amount of caffeine in your body increases and can give you symptoms, such as a rapid heartbeat, sweating, and anxiety.  Avoid smokers because they can make you want to smoke.  Do not let weight gain distract you. Many smokers will gain weight when they quit, usually less than 10 pounds. Eat a healthy diet and stay active. You can always lose the weight gained after you quit.  Find ways to improve  your mood other than smoking. FOR MORE INFORMATION  www.smokefree.gov  Document Released: 08/19/2001 Document Revised: 02/24/2012 Document Reviewed: 12/04/2011 ExitCare Patient Information 2014 [employer].  

## 2013-09-08 ENCOUNTER — Other Ambulatory Visit: Payer: Self-pay | Admitting: Cardiology

## 2013-09-12 ENCOUNTER — Other Ambulatory Visit: Payer: Self-pay

## 2013-09-12 DIAGNOSIS — I2510 Atherosclerotic heart disease of native coronary artery without angina pectoris: Secondary | ICD-10-CM

## 2013-09-12 MED ORDER — NITROGLYCERIN 0.4 MG SL SUBL: 0.4000 mg | SUBLINGUAL_TABLET | SUBLINGUAL | Status: DC | PRN

## 2013-11-08 ENCOUNTER — Other Ambulatory Visit: Payer: Self-pay | Admitting: Cardiology

## 2013-12-11 ENCOUNTER — Emergency Department (HOSPITAL_COMMUNITY)
Admission: EM | Admit: 2013-12-11 | Discharge: 2013-12-11 | Disposition: A | Discharge status: Home / Self Care | Payer: Medicare Other | Attending: Emergency Medicine | Admitting: Emergency Medicine

## 2013-12-11 ENCOUNTER — Encounter (HOSPITAL_COMMUNITY): Payer: Self-pay | Admitting: Emergency Medicine

## 2013-12-11 DIAGNOSIS — I2510 Atherosclerotic heart disease of native coronary artery without angina pectoris: Secondary | ICD-10-CM | POA: Insufficient documentation

## 2013-12-11 DIAGNOSIS — Z9861 Coronary angioplasty status: Secondary | ICD-10-CM | POA: Insufficient documentation

## 2013-12-11 DIAGNOSIS — Z862 Personal history of diseases of the blood and blood-forming organs and certain disorders involving the immune mechanism: Secondary | ICD-10-CM | POA: Insufficient documentation

## 2013-12-11 DIAGNOSIS — F17200 Nicotine dependence, unspecified, uncomplicated: Secondary | ICD-10-CM | POA: Insufficient documentation

## 2013-12-11 DIAGNOSIS — Z8639 Personal history of other endocrine, nutritional and metabolic disease: Secondary | ICD-10-CM | POA: Insufficient documentation

## 2013-12-11 DIAGNOSIS — M5412 Radiculopathy, cervical region: Secondary | ICD-10-CM | POA: Insufficient documentation

## 2013-12-11 DIAGNOSIS — I10 Essential (primary) hypertension: Secondary | ICD-10-CM | POA: Insufficient documentation

## 2013-12-11 DIAGNOSIS — Z880 Allergy status to penicillin: Secondary | ICD-10-CM | POA: Insufficient documentation

## 2013-12-11 DIAGNOSIS — I252 Old myocardial infarction: Secondary | ICD-10-CM | POA: Insufficient documentation

## 2013-12-11 DIAGNOSIS — F411 Generalized anxiety disorder: Secondary | ICD-10-CM | POA: Insufficient documentation

## 2013-12-11 DIAGNOSIS — M5410 Radiculopathy, site unspecified: Secondary | ICD-10-CM

## 2013-12-11 MED ORDER — HYDROCODONE-ACETAMINOPHEN 5-325 MG PO TABS: 1.0000 | ORAL_TABLET | Freq: Four times a day (QID) | ORAL | Status: DC | PRN

## 2013-12-11 MED ORDER — DIAZEPAM 5 MG PO TABS: 5.0000 mg | ORAL_TABLET | Freq: Two times a day (BID) | ORAL | Status: DC

## 2013-12-11 NOTE — Discharge Instructions (Signed)
Radicular Pain Radicular pain in either the arm or leg is usually from a bulging or herniated disk in the spine. A piece of the herniated disk may press against the nerves as the nerves exit the spine. This causes pain which is felt at the tips of the nerves down the arm or leg. Other causes of radicular pain may include:  Fractures.  Heart disease.  Cancer.  An abnormal and usually degenerative state of the nervous system or nerves (neuropathy). Diagnosis may require CT or MRI scanning to determine the primary cause.  Nerves that start at the neck (nerve roots) may cause radicular pain in the outer shoulder and arm. It can spread down to the thumb and fingers. The symptoms vary depending on which nerve root has been affected. In most cases radicular pain improves with conservative treatment. Neck problems may require physical therapy, a neck collar, or cervical traction. Treatment may take many weeks, and surgery may be considered if the symptoms do not improve.  Conservative treatment is also recommended for sciatica. Sciatica causes pain to radiate from the lower back or buttock area down the leg into the foot. Often there is a history of back problems. Most patients with sciatica are better after 2 to 4 weeks of rest and other supportive care. Short term bed rest can reduce the disk pressure considerably. Sitting, however, is not a good position since this increases the pressure on the disk. You should avoid bending, lifting, and all other activities which make the problem worse. Traction can be used in severe cases. Surgery is usually reserved for patients who do not improve within the first months of treatment. Only take over-the-counter or prescription medicines for pain, discomfort, or fever as directed by your caregiver. Narcotics and muscle relaxants may help by relieving more severe pain and spasm and by providing mild sedation. Cold or massage can give significant relief. Spinal manipulation  is not recommended. It can increase the degree of disc protrusion. Epidural steroid injections are often effective treatment for radicular pain. These injections deliver medicine to the spinal nerve in the space between the protective covering of the spinal cord and back bones (vertebrae). Your caregiver can give you more information about steroid injections. These injections are most effective when given within two weeks of the onset of pain.  You should see your caregiver for follow up care as recommended. A program for neck and back injury rehabilitation with stretching and strengthening exercises is an important part of management.  SEEK IMMEDIATE MEDICAL CARE IF:  You develop increased pain, weakness, or numbness in your arm or leg.  You develop difficulty with bladder or bowel control.  You develop abdominal pain. Document Released: 10/02/2004 Document Revised: 11/17/2011 Document Reviewed: 12/18/2008 ExitCare Patient Information 2014 [employer].  

## 2013-12-11 NOTE — ED Provider Notes (Signed)
Medical screening examination/treatment/procedure(s) were performed by non-physician practitioner and as supervising physician I was immediately available for consultation/collaboration.   EKG Interpretation None        Ezequiel Essex, MD 12/11/13 [phone]

## 2013-12-11 NOTE — ED Provider Notes (Signed)
CSN: [id number]     Arrival date & time 12/11/13  1308 History   This chart was scribed for non-physician practitioner Glendell Docker, NP working with Ezequiel Essex, MD by Adriana Reams, ED Scribe. This patient was seen in room WTR7/WTR7 and the patient's care was started at 1316.  First MD Initiated Contact with Patient 12/11/13 1316     Chief Complaint  Patient presents with  . Arm Pain  . Anxiety    The history is provided by the patient. No language interpreter was used.   HPI Comments: Aaron Velez is a 53 y.o. male with hx of CAD, HTN, HLD, MI, carotid artery occlusion and anxiety, who presents to the Emergency Department complaining of intermittent, sharp, shooting pain in his left arm that began last night. He reports the pain originates in his bicep and radiates down into his thumb. He reports he has a previous shoulder injury from when he was a teenager and this pain feels similar to it. 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 He denies any modifying factors. He denies CP or any other symptoms. He tried Asprin, Motrin and lorazepam with no relief. He states that this does not feel like his previous MI. He is currently unemployed and denies recent changes in physical activity.  His PCP prescribed him lorazepam. His next appointment with his PCP is 12/26/13.    Past Medical History  Diagnosis Date  . CAD (coronary artery disease)   . HTN (hypertension)   . HLD (hyperlipidemia)   . Myocardial infarction 2001 & 2008  . Leg pain   . Chest pain   . Shortness of breath   . Carotid artery occlusion    Past Surgical History  Procedure Laterality Date  . Carotid endarterectomy  12/05/2010    right CEA  . Coronary angioplasty with stent placement  2001,2007,2008  . Finger surgery      For brown recluse spider bite   Family History  Problem Relation Age of Onset  . Hypertension    . Coronary artery disease      History  Substance Use Topics  . Smoking status: Current Every Day Smoker -- 1.00 packs/day for 40 years    Types: Cigarettes  . Smokeless tobacco: Former Systems developer    Types: Chew  . Alcohol Use: No    Review of Systems  Cardiovascular: Negative for chest pain.  Musculoskeletal: Positive for myalgias.  All other systems reviewed and are negative.      Allergies  Penicillins  Home Medications    BP 142/67  Pulse 61  Temp(Src) 97.8 F (36.6 C) (Oral)  Resp 16  SpO2 100%  Physical Exam  Nursing note and vitals reviewed. Constitutional: He appears well-developed and well-nourished. No distress.  HENT:  Head: Normocephalic and atraumatic.  Eyes: Conjunctivae are normal.  Neck: Neck supple. No tracheal deviation present.  Cardiovascular: Normal rate.   Pulmonary/Chest: Effort normal. No respiratory distress.  Musculoskeletal:       Right shoulder: He exhibits decreased range of motion and tenderness. He exhibits no swelling, no deformity and normal strength.  Equal and strong upper extremity grip strength bilaterally. No deformity, swelling, erythema or warmth noted to right arm. Decreased ROM at baseline.  Neurological: He is alert.  Skin: Skin is warm and dry.  Psychiatric: He has a normal mood and affect. His behavior is normal.    ED Course  Procedures (including critical care  time) DIAGNOSTIC STUDIES: Oxygen Saturation is 100% on RA, normal by my interpretation.    COORDINATION OF CARE: 1:34 PM Discussed treatment plan with pt at bedside and pt agreed to plan. Will discharge home with Vicodin and Valium. Advised pt to follow up with PCP.  Labs Review Labs Reviewed - No data to display Imaging Review No results found.   EKG Interpretation None      MDM   Final diagnoses:  Radiculopathy of arm    Will treat symptomatically at home. Pt given follow up with Dr. Barbaraann Barthel  I personally performed the services described in this documentation, which was  scribed in my presence. The recorded information has been reviewed and is accurate.    Glendell Docker, NP 12/11/13 1441

## 2013-12-11 NOTE — ED Notes (Signed)
Patient states that he is having pain to his left arm starting in his let sholder shooting down into his left fingers. The patient was having some chest pain on Friday. The ptient was worked up and sent home with anxiety medications. When the pain starts he had panic attacks and he is now out of the panic attack medications

## 2013-12-23 ENCOUNTER — Telehealth: Payer: Self-pay

## 2013-12-23 NOTE — Telephone Encounter (Signed)
Left message for call back Non identifiable  

## 2013-12-26 ENCOUNTER — Ambulatory Visit (INDEPENDENT_AMBULATORY_CARE_PROVIDER_SITE_OTHER): Payer: Medicare Other | Admitting: Internal Medicine

## 2013-12-26 ENCOUNTER — Encounter: Payer: Self-pay | Admitting: Internal Medicine

## 2013-12-26 VITALS — BP 93/58 | HR 60 | Temp 98.0°F | Ht 68.2 in | Wt 137.0 lb

## 2013-12-26 DIAGNOSIS — F17200 Nicotine dependence, unspecified, uncomplicated: Secondary | ICD-10-CM

## 2013-12-26 DIAGNOSIS — I2510 Atherosclerotic heart disease of native coronary artery without angina pectoris: Secondary | ICD-10-CM

## 2013-12-26 DIAGNOSIS — F419 Anxiety disorder, unspecified: Secondary | ICD-10-CM

## 2013-12-26 DIAGNOSIS — F411 Generalized anxiety disorder: Secondary | ICD-10-CM

## 2013-12-26 MED ORDER — ESCITALOPRAM OXALATE 10 MG PO TABS: 10.0000 mg | ORAL_TABLET | Freq: Every day | ORAL | Status: DC

## 2013-12-26 MED ORDER — VARENICLINE TARTRATE 0.5 MG X 11 & 1 MG X 42 PO MISC: ORAL | Status: DC

## 2013-12-26 MED ORDER — VARENICLINE TARTRATE 1 MG PO TABS: 1.0000 mg | ORAL_TABLET | Freq: Two times a day (BID) | ORAL | Status: DC

## 2013-12-26 NOTE — Assessment & Plan Note (Signed)
Long history of tobacco abuse, smokes 1-1.5 packs a day. Tried Chantix before, no s/e Plan: Prescribe Chantix starting and f/u rx  RTC 6 weerks fasting for a cpx

## 2013-12-26 NOTE — Progress Notes (Signed)
Subjective:    Patient ID: Aaron Velez, male    DOB: 01-16-1961, 53 y.o.   MRN: [id number]  DOS:  12/26/2013 Type of  visit: Here to get established, here with his wife. Has had extensive medical history, chart is reviewed.  His main concern today is anxiety, anxious since his first heart attack, usually manage sx with PRN benzodiazepines but he feels he needs something different. Wife suggested SSRIs. On further questioning, he also feels depressed.The source of his anxiety  Is the fact that he was forced to retire due to CAD, also has issues with his children.  Still smokes, in the past tried Chantix without apparent side effects and would like to try again.  ROS No recent chest pain or difficulty breathing Denies cough, sputum production, wheezing or hemoptysis. No suicidal ideas No  fatigue  Past Medical History  Diagnosis Date  . CAD (coronary artery disease)   . HTN (hypertension)   . HLD (hyperlipidemia)   . Myocardial infarction 2001 & 2008  . Shortness of breath   . Carotid artery occlusion     Past Surgical History  Procedure Laterality Date  . Carotid endarterectomy  12/05/2010    right CEA  . Coronary angioplasty with stent placement  2001,2007,2008  . Finger surgery      For brown recluse spider bite  . Percutaneous placement intravascular stent cervical carotid artery Right 01-2013    History   Social History  . Marital Status: Married    Spouse Name: N/A    Number of Children: 4  . Years of Education: N/A   Occupational History  . retired     Social History Main Topics  . Smoking status: Current Every Day Smoker -- 1.00 packs/day for 40 years    Types: Cigarettes  . Smokeless tobacco: Former Systems developer    Types: Chew     Comment: 1 to 1.5 PPD  . Alcohol Use: Yes     Comment: socially   . Drug Use: No  . Sexual Activity: Not on file   Other Topics Concern  . Not on file   Social History Narrative   HS Diploma     Family History    Problem Relation Age of Onset  . Hypertension Neg Hx   . Coronary artery disease Neg Hx   . Cancer - Colon Neg Hx   . Prostate cancer Neg Hx   . Diabetes Neg Hx        Medication List       This list is accurate as of: 12/26/13  5:22 PM.  Always use your most recent med list.               aspirin 81 MG tablet  Take 81 mg by mouth daily.     atorvastatin 20 MG tablet  Commonly known as:  LIPITOR  Take 20 mg by mouth daily.     carvedilol 6.25 MG tablet  Commonly known as:  COREG  TAKE ONE TABLET BY MOUTH TWICE DAILY     clindamycin 300 MG capsule  Commonly known as:  CLEOCIN  Take 300 mg by mouth 3 (three) times daily.     clopidogrel 75 MG tablet  Commonly known as:  PLAVIX  Take 1 tablet (75 mg total) by mouth daily.     diazepam 5 MG tablet  Commonly known as:  VALIUM  Take 1 tablet (5 mg total) by mouth 2 (two) times daily.  escitalopram 10 MG tablet  Commonly known as:  LEXAPRO  Take 1 tablet (10 mg total) by mouth daily.     HYDROcodone-acetaminophen 5-325 MG per tablet  Commonly known as:  NORCO/VICODIN  Take 1-2 tablets by mouth every 6 (six) hours as needed.     ibuprofen 600 MG tablet  Commonly known as:  ADVIL,MOTRIN  Take 600 mg by mouth every 6 (six) hours as needed.     LORazepam 0.5 MG tablet  Commonly known as:  ATIVAN  Take 0.5 mg by mouth as needed for anxiety. Dr Aundra Dubin will not be able to provide additional refills     multivitamin with minerals Tabs tablet  Take 1 tablet by mouth daily.     nitroGLYCERIN 0.4 MG SL tablet  Commonly known as:  NITROSTAT  Place 1 tablet (0.4 mg total) under the tongue every 5 (five) minutes as needed for chest pain.     ramipril 2.5 MG capsule  Commonly known as:  ALTACE  Take 2.5 mg by mouth daily.     varenicline 0.5 MG X 11 & 1 MG X 42 tablet  Commonly known as:  CHANTIX STARTING MONTH PAK  Take one 0.5 mg tablet by mouth once daily for 3 days, then increase to one 0.5 mg tablet twice  daily for 4 days, then increase to one 1 mg tablet twice daily.     varenicline 1 MG tablet  Commonly known as:  CHANTIX  Take 1 tablet (1 mg total) by mouth 2 (two) times daily.           Objective:   Physical Exam BP 93/58  Pulse 60  Temp(Src) 98 F (36.7 C)  Ht 5' 8.2" (1.732 m)  Wt 137 lb (62.143 kg)  BMI 20.72 kg/m2  SpO2 97%  General -- alert, well-developed, NAD.   HEENT-- Not pale.  Lungs -- normal respiratory effort, no intercostal retractions, no accessory muscle use, and normal breath sounds.  Heart-- normal rate, regular rhythm, no murmur.  Abdomen-- Not distended, good bowel sounds,soft, non-tender.  Extremities-- no pretibial edema bilaterally  Neurologic--  alert & oriented X3. Speech normal, gait normal, strength normal in all extremities.    Psych-- Cognition and judgment appear intact. Cooperative with normal attention span and concentration. No anxious or depressed appearing.     Assessment & Plan:   Today , I spent more than 30 min with the patient, >50% of the time counseling, and  reviewing the chart and labs ordered by other providers

## 2013-12-26 NOTE — Telephone Encounter (Signed)
Unable to reach prior to visit  

## 2013-12-26 NOTE — Assessment & Plan Note (Signed)
History of anxiety since his first heart attack mostly related to feeling bored b/c was forced to retire  , also having issues with his children. Has a good relationship with his wife.  PHQ-9---->  13 moderate depression Plan: Emotional support provided today Recommend to see a counselor which the patient is not inclined to Trial with Lexapro Reassess in 6 weeks Okay to continue with lorazepam as needed. (Taking Valium temporarily for neck pain)

## 2013-12-26 NOTE — Patient Instructions (Signed)
Take the medications as prescribed Call if side effects  Please come back -fasting- in 6 weeks for a physical exam, make an appointment

## 2013-12-26 NOTE — Progress Notes (Signed)
Pre visit review using our clinic review tool, if applicable. No additional management support is needed unless otherwise documented below in the visit note. 

## 2013-12-27 ENCOUNTER — Telehealth: Payer: Self-pay | Admitting: Internal Medicine

## 2013-12-27 NOTE — Telephone Encounter (Signed)
Relevant patient education assigned to patient using Emmi. ° °

## 2013-12-31 ENCOUNTER — Emergency Department (HOSPITAL_COMMUNITY)
Admission: EM | Admit: 2013-12-31 | Discharge: 2013-12-31 | Disposition: A | Discharge status: Home / Self Care | Payer: Medicare Other | Attending: Emergency Medicine | Admitting: Emergency Medicine

## 2013-12-31 ENCOUNTER — Encounter (HOSPITAL_COMMUNITY): Payer: Self-pay | Admitting: Emergency Medicine

## 2013-12-31 DIAGNOSIS — K0889 Other specified disorders of teeth and supporting structures: Secondary | ICD-10-CM

## 2013-12-31 DIAGNOSIS — I10 Essential (primary) hypertension: Secondary | ICD-10-CM | POA: Insufficient documentation

## 2013-12-31 DIAGNOSIS — E785 Hyperlipidemia, unspecified: Secondary | ICD-10-CM | POA: Insufficient documentation

## 2013-12-31 DIAGNOSIS — K047 Periapical abscess without sinus: Secondary | ICD-10-CM | POA: Insufficient documentation

## 2013-12-31 DIAGNOSIS — Z7902 Long term (current) use of antithrombotics/antiplatelets: Secondary | ICD-10-CM | POA: Insufficient documentation

## 2013-12-31 DIAGNOSIS — Z7982 Long term (current) use of aspirin: Secondary | ICD-10-CM | POA: Insufficient documentation

## 2013-12-31 DIAGNOSIS — F17200 Nicotine dependence, unspecified, uncomplicated: Secondary | ICD-10-CM | POA: Insufficient documentation

## 2013-12-31 DIAGNOSIS — I2510 Atherosclerotic heart disease of native coronary artery without angina pectoris: Secondary | ICD-10-CM | POA: Insufficient documentation

## 2013-12-31 DIAGNOSIS — K029 Dental caries, unspecified: Secondary | ICD-10-CM | POA: Insufficient documentation

## 2013-12-31 DIAGNOSIS — I252 Old myocardial infarction: Secondary | ICD-10-CM | POA: Insufficient documentation

## 2013-12-31 DIAGNOSIS — Z9861 Coronary angioplasty status: Secondary | ICD-10-CM | POA: Insufficient documentation

## 2013-12-31 DIAGNOSIS — Z79899 Other long term (current) drug therapy: Secondary | ICD-10-CM | POA: Insufficient documentation

## 2013-12-31 DIAGNOSIS — Z880 Allergy status to penicillin: Secondary | ICD-10-CM | POA: Insufficient documentation

## 2013-12-31 MED ORDER — HYDROCODONE-ACETAMINOPHEN 5-325 MG PO TABS: 1.0000 | ORAL_TABLET | Freq: Four times a day (QID) | ORAL | Status: DC | PRN

## 2013-12-31 MED ORDER — CLINDAMYCIN HCL 150 MG PO CAPS: 300.0000 mg | ORAL_CAPSULE | Freq: Three times a day (TID) | ORAL | Status: DC

## 2013-12-31 NOTE — ED Notes (Signed)
Pt from home c/o lower bilateral dental pain x2 weeks. Pt just completed abx for the same with no relief. Pt is A&O and in NAD

## 2013-12-31 NOTE — Discharge Instructions (Signed)
Take the prescribed medication as directed.  Do not drive while taking percocet. Follow-up with dentist-- referrals and resource guide given to help with this. Return to the ED for new or worsening symptoms.   Emergency Department Resource Guide 1) Find a Doctor and Pay Out of Pocket Although you won't have to find out who is covered by your insurance plan, it is a good idea to ask around and get recommendations. You will then need to call the office and see if the doctor you have chosen will accept you as a new patient and what types of options they offer for patients who are self-pay. Some doctors offer discounts or will set up payment plans for their patients who do not have insurance, but you will need to ask so you aren't surprised when you get to your appointment.  2) Contact Your Local Health Department Not all health departments have doctors that can see patients for sick visits, but many do, so it is worth a call to see if yours does. If you don't know where your local health department is, you can check in your phone book. The CDC also has a tool to help you locate your state's health department, and many state websites also have listings of all of their local health departments.  3) Find a [facility] If your illness is not likely to be very severe or complicated, you may want to try a walk in clinic. These are popping up all over the country in pharmacies, drugstores, and shopping centers. They're usually staffed by nurse practitioners or physician assistants that have been trained to treat common illnesses and complaints. They're usually fairly quick and inexpensive. However, if you have serious medical issues or chronic medical problems, these are probably not your best option.  No Primary Care Doctor: - Call Health Connect at  [phone] - they can help you locate a primary care doctor that  accepts your insurance, provides certain services, etc. - Physician Referral Service-  [phone]  Chronic Pain Problems: Organization         Address  Phone   Notes  [facility]  [phone] Patients need to be referred by their primary care doctor.   Medication Assistance: Organization         Address  Phone   Notes  [facility] Medication [facility] [address] [place], [place] [address] [phone] --Must be a resident of [facility] -- Must have NO insurance coverage whatsoever (no Medicaid/ Medicare, etc.) -- The pt. MUST have a primary care doctor that directs their care regularly and follows them in the community   MedAssist  [phone]   [employer]  [phone]    Agencies that provide inexpensive medical care: Organization         Address  Phone   Notes  [place]  [phone]   [facility]    [phone]   [facility] [place], [place] [address] [phone]   [place] [address] [place] [phone]   Planned Parenthood    [phone]   [facility]    [phone]   [place] and [place] [address], [place] Phone:  [phone], Fax:  [phone] Hours of Operation:  9 am - 6 pm, M-F.  Also accepts Medicaid/Medicare and self-pay.  [facility] for  Children  [address], [place] Phone: [phone], Fax: [phone]. Hours of Operation:  8:30 am - 5:30 pm, M-F.  Also accepts Medicaid and self-pay.  [facility] [place] [address] [place] Phone: [phone]   [place], [place], [place] [phone], Ext. [phone] Mondays & Thursdays: 7-9 AM.  First 15 patients are seen on a first come, first serve basis.    [place] Providers:  Organization         Address  Phone   Notes  [facility] [address],  [place] [phone] Also accepts self-pay patients.  [facility] [id number] [address], [place]  [phone]   [place], [address], [place] [phone]   [facility] [address] [place] [phone]   Lucianne Lei [address], [place]   [phone] Only accepts [place] Access [place] patients after they have their name applied to their card.   Self-Pay (no insurance) in [facility]:  Organization         Address  Phone   Notes  Sickle Cell Patients, [facility] Internal Medicine [place] [phone]   [facility] Urgent Care [place] [phone]   [facility] [place]  [place], [place], [place] [phone]   [facility]/Dr. Osei-Bonsu  [address] [place] or [address], [place] [phone] Phone number for both [place] and [place] locations is the same.  [facility] [address] [place] [phone]   [facility] [address] [place] or [address] [place] [address] [phone]   [facility] [address] [place] [phone], phone; [phone], fax Sees patients 1st and 3rd Saturday of every month.  Must not qualify for public or private insurance (i.e. Medicaid, Medicare, [place] Health Choice, Veterans' Benefits)  Household income should be no more than 200% of the poverty level The clinic cannot treat you if you are pregnant or think you are pregnant  Sexually transmitted diseases are not treated at the clinic.    Dental Care: Organization         Address  Phone  Notes  [facility] Department of [facility] [phone] Accepts children up to age 64 who are enrolled in  [place] or [place]; pregnant women with a Medicaid card; and children who have applied for Medicaid or [place] Health Choice, but were declined, whose parents can pay a reduced fee at time of service.  [facility] Department of [facility]  [address], [place] [phone] Accepts children up to age 59 who are enrolled in [place] or [place]; pregnant women with a Medicaid card; and children who have applied for Medicaid or [place] Health Choice, but were declined, whose parents can pay a reduced fee at time of service.  [facility] Adult [facility] Access PRO[facility]  [place] [phone] Patients are seen by appointment only. Walk-ins are not accepted. [place] will see patients 70 years of age and older. Monday - Tuesday (8am-5pm) Most Wednesdays (8:30-5pm) $30 per visit, cash only  [place] Adult [facility] Access PRO[facility]  [address], [facility] [phone] Patients are seen by appointment only. Walk-ins are not accepted. [place] will see patients 55 years of age and older. One Wednesday Evening (Monthly: Volunteer Based).  $30 per visit, cash only  [place]  [phone] for adults; Children under age 16, call Graduate Pediatric Dentistry at [phone]. Children aged 15-14, please call [phone] to request a pediatric application.  Dental services are provided in all areas of dental care including fillings, crowns and bridges, complete and partial dentures, implants, gum treatment, root canals, and extractions. Preventive care is also provided. Treatment is provided to both adults and children. Patients are selected via a lottery and there is often a waiting list.   [facility] [address] [place]  [phone] [email or web address]   [facility] [address] [place], [place] [phone], Ext. [phone] Second and Fourth Thursday of each month, opens at 6:30  AM; Clinic ends at 9 AM.  Patients are seen on a first-come first-served basis, and a limited number are seen during each clinic.   [facility]  [address] [place] Danker [place], [place] [phone]   Eligibility Requirements You must have lived in [place], [place], or [place] counties for at least the last three months.   You cannot be eligible for state or federal sponsored [employer], including [employer], [place], or [employer].   You generally cannot be eligible for healthcare insurance through your employer.    How to apply: Eligibility screenings are held every Tuesday and Wednesday afternoon from 1:00 pm until 4:00 pm. You do not need an appointment for the interview!  [facility] [address] [place], [place]   [employer]  [place] Department  [place]  [phone]    Behavioral Health Resources in the Community: Intensive Outpatient Programs Organization         Address  Phone  Notes  [place]. [address], [place], [place] [phone]   [facility] Outpatient [address], [place], [employer]   ADS: Alcohol & Drug Svcs [address], [place], [place]   [place] [address]  [place], [place] or [phone]   Substance Abuse Resources Organization         Address  Phone  Notes  Alcohol and Drug Services  [phone]   [place]  [phone]   [facility] [place]   Chinita Pester  [phone]   Residential & Outpatient Substance Abuse Program  [phone]   Psychological Services Organization         Address  Phone  Notes  [facility] [place]  [place]  [phone]   [place] [address] [place] [phone] or  [phone]    Mobile Crisis Teams Organization         Address  Phone  Notes  [facility], Mobile Crisis Care Unit  [phone]   [facility]  [address] [place], [place]   [place] [address] [place] [phone]    Self-Help/Support Groups Organization         Address  Phone             Notes  Mental Health Assoc. of [place] - variety of support groups  [place] Call for more information  Narcotics Anonymous (NA), [facility]aring [facility] [address], [employer] [place]  2 meetings at this location   Special educational needs teacher         Address  Phone  Notes  [facility] [place],    [place]  [phone]   [facility]  [address], [place] [address], [place], [place]   [place], [place] [phone] Admissions: 8am-3pm M-F  Incentives Substance [place] [address],    [place], [place] [phone]   [facility] [address] [place], [place], [place]   [facility] [address],  [place], [place]   [facility] - Intensive Outpatient [address], [place], [place]   [facility] ([place].,  [place], [place] [phone] or [phone]   [facility] (RTS) [address] [place], [place] Accepts Medicaid  [facility] [place] [address]  [place] [phone] Substance Abuse/Addiction Treatment   [facility] Organization         Address  Phone  Notes  [facility]  [phone]   Domenic Schwab, PhD [address] Arlis [place], [place]   [phone] or [phone]   [place] Per[place], [place] [phone]   [facility] [address],  [place], [place] [phone] Insurance/Medicaid/sponsorship through [facility] and Families [address], [address]                                    [place], [place] [phone] [place] [address], [place] [phone]    Dr. Adele Schilder  [phone]   [facility] Dept. 1) [address] [place] 2) [place] 3)  [place] [address], [place] [phone]  [phone]   [place] [phone] or [phone] (After Hours)

## 2013-12-31 NOTE — ED Provider Notes (Signed)
Medical screening examination/treatment/procedure(s) were performed by non-physician practitioner and as supervising physician I was immediately available for consultation/collaboration.   EKG Interpretation None      Rolland Porter, MD, Abram Sander   Janice Norrie, MD 12/31/13 2051

## 2013-12-31 NOTE — ED Provider Notes (Signed)
CSN: [id number]     Arrival date & time 12/31/13  1758 History  This chart was scribed for non-physician practitioner Quincy Carnes, PA-C working with Janice Norrie, MD by Mercy Moore, ED Scribe. This patient was seen in room [place] and the patient's care was started at 7:31 PM.   Chief Complaint  Patient presents with  . Dental Pain      The history is provided by the patient. No language interpreter was used.   HPI Comments: Aaron Velez is a 53 y.o. male who presents to the Emergency Department complaining of throbbing lower bilateral dental pain, ongoing for two weeks. Patient reports recently taking antibiotics for a right sided abscess. He did not complete the series. He developed an abscess on the left side and then completed the series of antibiotics. Patient reports that the dental pain had seemingly resolved but has recurred. Patient has partial bottom dentures and reports poor dentition including a chipped tooth.  Patient shares that he has been unable to afford the dental work that he needs due to lack of insurance and income.  Denies fever or chills.   Past Medical History  Diagnosis Date  . CAD (coronary artery disease)   . HTN (hypertension)   . HLD (hyperlipidemia)   . Myocardial infarction 2001 & 2008  . Shortness of breath   . Carotid artery occlusion    Past Surgical History  Procedure Laterality Date  . Carotid endarterectomy  12/05/2010    right CEA  . Coronary angioplasty with stent placement  2001,2007,2008  . Finger surgery      For brown recluse spider bite  . Percutaneous placement intravascular stent cervical carotid artery Right 01-2013   Family History  Problem Relation Age of Onset  . Hypertension Neg Hx   . Coronary artery disease Neg Hx   . Cancer - Colon Neg Hx   . Prostate cancer Neg Hx   . Diabetes Neg Hx    History  Substance Use Topics  . Smoking status: Current Every Day Smoker -- 1.00 packs/day for 40 years    Types: Cigarettes   . Smokeless tobacco: Former Systems developer    Types: Chew     Comment: 1 to 1.5 PPD  . Alcohol Use: Yes     Comment: socially     Review of Systems  Constitutional: Negative for fever.  HENT: Positive for dental problem.   All other systems reviewed and are negative.     Allergies  Penicillins  Home Medications   Prior to Admission medications   Medication Sig Start Date End Date Taking? Authorizing Provider  aspirin 81 MG tablet Take 81 mg by mouth daily.     Yes Historical Provider, MD  atorvastatin (LIPITOR) 20 MG tablet Take 20 mg by mouth daily.   Yes Historical Provider, MD  carvedilol (COREG) 6.25 MG tablet TAKE ONE TABLET BY MOUTH TWICE DAILY 09/08/13  Yes Larey Dresser, MD  clindamycin (CLEOCIN) 300 MG capsule Take 300 mg by mouth 3 (three) times daily.   Yes Historical Provider, MD  clopidogrel (PLAVIX) 75 MG tablet Take 1 tablet (75 mg total) by mouth daily. 08/26/13  Yes Larey Dresser, MD  diazepam (VALIUM) 5 MG tablet Take 1 tablet (5 mg total) by mouth 2 (two) times daily. 12/11/13  Yes Glendell Docker, NP  escitalopram (LEXAPRO) 10 MG tablet Take 1 tablet (10 mg total) by mouth daily. 12/26/13  Yes Colon Branch, MD  HYDROcodone-acetaminophen (NORCO/VICODIN) 5-325 MG  per tablet Take 1-2 tablets by mouth every 6 (six) hours as needed. 12/11/13  Yes Glendell Docker, NP  ibuprofen (ADVIL,MOTRIN) 600 MG tablet Take 600 mg by mouth every 6 (six) hours as needed.   Yes Historical Provider, MD  nitroGLYCERIN (NITROSTAT) 0.4 MG SL tablet Place 1 tablet (0.4 mg total) under the tongue every 5 (five) minutes as needed for chest pain. 09/12/13  Yes Larey Dresser, MD  ramipril (ALTACE) 2.5 MG capsule Take 2.5 mg by mouth daily.   Yes Historical Provider, MD  varenicline (CHANTIX STARTING MONTH PAK) 0.5 MG X 11 & 1 MG X 42 tablet Take one 0.5 mg tablet by mouth once daily for 3 days, then increase to one 0.5 mg tablet twice daily for 4 days, then increase to one 1 mg tablet twice daily.  12/26/13   Colon Branch, MD  varenicline (CHANTIX) 1 MG tablet Take 1 tablet (1 mg total) by mouth 2 (two) times daily. 12/26/13   Colon Branch, MD   Triage Vitals: BP 117/68  Pulse 58  Temp(Src) 98.1 F (36.7 C) (Oral)  Resp 16  SpO2 99% Physical Exam  Nursing note and vitals reviewed. Constitutional: He is oriented to person, place, and time. He appears well-developed and well-nourished. No distress.  HENT:  Head: Normocephalic and atraumatic.  Mouth/Throat: Uvula is midline, oropharynx is clear and moist and mucous membranes are normal. Abnormal dentition. Dental abscesses and dental caries present. No oropharyngeal exudate, posterior oropharyngeal edema, posterior oropharyngeal erythema or tonsillar abscesses.  Teeth largely in poor dentition, multiple teeth are missing, right lower incisor broken with large cavity present, surrounding gingiva swollen and discolored concerning for dental abscess, handling secretions appropriately, no trismus  Eyes: Conjunctivae and EOM are normal. Pupils are equal, round, and reactive to light.  Neck: Normal range of motion. Neck supple.  Cardiovascular: Normal rate, regular rhythm and normal heart sounds.   Pulmonary/Chest: Effort normal and breath sounds normal. No respiratory distress. He has no wheezes.  Musculoskeletal: Normal range of motion.  Neurological: He is alert and oriented to person, place, and time.  Skin: Skin is warm and dry. He is not diaphoretic.  Psychiatric: He has a normal mood and affect.    ED Course  Procedures (including critical care time) DIAGNOSTIC STUDIES: Oxygen Saturation is 99% on room air, normal by my interpretation.    COORDINATION OF CARE: 7:38 PM- Will prescribe antibiotics and refer to an affordable dentist. Discussed treatment plan with patient at bedside and patient agreed to plan.  Labs Review Labs Reviewed - No data to display  Imaging Review No results found.   EKG Interpretation None      MDM    Final diagnoses:  Dental abscess  Pain, dental   Pt with recurrent dental pain and dental abscesses.  Will re-start on abx, pain meds.  FU with dentist, referrals and resource guide provided.  Discussed plan with patient, he/she acknowledged understanding and agreed with plan of care.  Return precautions given for new or worsening symptoms.  I personally performed the services described in this documentation, which was scribed in my presence. The recorded information has been reviewed and is accurate.  Larene Pickett, PA-C 12/31/13 1956

## 2014-02-07 ENCOUNTER — Telehealth: Payer: Self-pay

## 2014-02-07 NOTE — Telephone Encounter (Signed)
Left message for call back Non identifiable  

## 2014-02-08 ENCOUNTER — Encounter: Payer: Self-pay | Admitting: Internal Medicine

## 2014-02-08 ENCOUNTER — Ambulatory Visit (INDEPENDENT_AMBULATORY_CARE_PROVIDER_SITE_OTHER): Payer: Medicare Other | Admitting: Internal Medicine

## 2014-02-08 VITALS — BP 99/60 | HR 61 | Temp 98.2°F | Ht 68.2 in | Wt 137.0 lb

## 2014-02-08 DIAGNOSIS — I2510 Atherosclerotic heart disease of native coronary artery without angina pectoris: Secondary | ICD-10-CM

## 2014-02-08 DIAGNOSIS — E785 Hyperlipidemia, unspecified: Secondary | ICD-10-CM

## 2014-02-08 DIAGNOSIS — Z23 Encounter for immunization: Secondary | ICD-10-CM

## 2014-02-08 DIAGNOSIS — F419 Anxiety disorder, unspecified: Secondary | ICD-10-CM

## 2014-02-08 DIAGNOSIS — Z0000 Encounter for general adult medical examination without abnormal findings: Secondary | ICD-10-CM

## 2014-02-08 HISTORY — DX: Encounter for general adult medical examination without abnormal findings: Z00.00

## 2014-02-08 NOTE — Assessment & Plan Note (Signed)
Asymptomatic. 

## 2014-02-08 NOTE — Patient Instructions (Signed)
Come back fasting for labs : CMP, CBC ---- dx CAD TSH, PSA ---- dx v70 FLP --- dx high cholesterol   Next visit is for routine check up  in 6  months No need to come back fasting Please make an appointment    Fall Prevention and Home Safety Falls cause injuries and can affect all age groups. It is possible to use preventive measures to significantly decrease the likelihood of falls. There are many simple measures which can make your home safer and prevent falls. OUTDOORS  Repair cracks and edges of walkways and driveways.  Remove high doorway thresholds.  Trim shrubbery on the main path into your home.  Have good outside lighting.  Clear walkways of tools, rocks, debris, and clutter.  Check that handrails are not broken and are securely fastened. Both sides of steps should have handrails.  Have leaves, snow, and ice cleared regularly.  Use sand or salt on walkways during winter months.  In the garage, clean up grease or oil spills. BATHROOM  Install night lights.  Install grab bars by the toilet and in the tub and shower.  Use non-skid mats or decals in the tub or shower.  Place a plastic non-slip stool in the shower to sit on, if needed.  Keep floors dry and clean up all water on the floor immediately.  Remove soap buildup in the tub or shower on a regular basis.  Secure bath mats with non-slip, double-sided rug tape.  Remove throw rugs and tripping hazards from the floors. BEDROOMS  Install night lights.  Make sure a bedside light is easy to reach.  Do not use oversized bedding.  Keep a telephone by your bedside.  Have a firm chair with side arms to use for getting dressed.  Remove throw rugs and tripping hazards from the floor. KITCHEN  Keep handles on pots and pans turned toward the center of the stove. Use back burners when possible.  Clean up spills quickly and allow time for drying.  Avoid walking on wet floors.  Avoid hot utensils and  knives.  Position shelves so they are not too high or low.  Place commonly used objects within easy reach.  If necessary, use a sturdy step stool with a grab bar when reaching.  Keep electrical cables out of the way.  Do not use floor polish or wax that makes floors slippery. If you must use wax, use non-skid floor wax.  Remove throw rugs and tripping hazards from the floor. STAIRWAYS  Never leave objects on stairs.  Place handrails on both sides of stairways and use them. Fix any loose handrails. Make sure handrails on both sides of the stairways are as long as the stairs.  Check carpeting to make sure it is firmly attached along stairs. Make repairs to worn or loose carpet promptly.  Avoid placing throw rugs at the top or bottom of stairways, or properly secure the rug with carpet tape to prevent slippage. Get rid of throw rugs, if possible.  Have an electrician put in a light switch at the top and bottom of the stairs. OTHER FALL PREVENTION TIPS  Wear low-heel or rubber-soled shoes that are supportive and fit well. Wear closed toe shoes.  When using a stepladder, make sure it is fully opened and both spreaders are firmly locked. Do not climb a closed stepladder.  Add color or contrast paint or tape to grab bars and handrails in your home. Place contrasting color strips on first and last  steps.  Learn and use mobility aids as needed. Install an electrical emergency response system.  Turn on lights to avoid dark areas. Replace light bulbs that burn out immediately. Get light switches that glow.  Arrange furniture to create clear pathways. Keep furniture in the same place.  Firmly attach carpet with non-skid or double-sided tape.  Eliminate uneven floor surfaces.  Select a carpet pattern that does not visually hide the edge of steps.  Be aware of all pets. OTHER HOME SAFETY TIPS  Set the water temperature for 120 F (48.8 C).  Keep emergency numbers on or near the  telephone.  Keep smoke detectors on every level of the home and near sleeping areas. Document Released: 08/15/2002 Document Revised: 02/24/2012 Document Reviewed: 11/14/2011 [facility] Patient Information 2014 [place].

## 2014-02-08 NOTE — Assessment & Plan Note (Signed)
Improve with Lexapro, continue SSRIs, at this point he does not need lorazepam

## 2014-02-08 NOTE — Assessment & Plan Note (Signed)
Good med compliance , labs  

## 2014-02-08 NOTE — Progress Notes (Signed)
Pre visit review using our clinic review tool, if applicable. No additional management support is needed unless otherwise documented below in the visit note. 

## 2014-02-08 NOTE — Telephone Encounter (Signed)
Medication List and allergies:  Updated and Reviewed  90 day supply/mail order: n/a Local prescriptions:  [facility] [place], [place] - [address]  Immunization due:  Td/tdap  A/P: No changes to personal, family or PSH Tdap- unsure of last vaccine; greater than 10 years ago CCS- has never received   To discuss with provider: Nothing at this time.

## 2014-02-08 NOTE — Progress Notes (Signed)
Subjective:    Patient ID: Aaron Velez, male    DOB: 25-Nov-1960, 53 y.o.   MRN: [id number]  DOS:  02/08/2014 Type of  Visit:  Here for Medicare AWV:  1. Risk factors based on Past M, S, F history: reviewed 2. Physical Activities:  Sedentary, occ walk 3. Depression/mood: see a/p 4. Hearing:  No problemss noted or reported  5. ADL's:  Completely independent  6. Fall Risk: low risk, precautions discussed  7. home Safety: does feel safe at home  8. Height, weight, & visual acuity: see VS, does not see the eye MD ($), doing ok w/ reading glasses  9. Counseling: provided 10. Labs ordered based on risk factors: if needed  11. Referral Coordination: if needed 12. Care Plan, see assessment and plan  13. Cognitive Assessment: cognition and motor normal and appropriate for age   In addition, today we discussed the following: Anxiety, on Lexapro, better. High cholesterol, good compliance with Lipitor. CAD, good compliance with medications, BP today slightly low but he feels well. Was seen in the ER with a dental abscess  few weeks ago, ER note reviewed, symptoms resolved per patient.   ROS No  CP, SOB No palpitations, no lower extremity edema Denies  nausea, vomiting diarrhea, blood in the stools (-) cough, sputum production (-) wheezing, chest congestion  (-)hemoptysis  No dysuria, gross hematuria, difficulty urinating      Past Medical History  Diagnosis Date  . CAD (coronary artery disease)   . HTN (hypertension)   . HLD (hyperlipidemia)   . Myocardial infarction 2001 & 2008  . Shortness of breath   . Carotid artery occlusion     Past Surgical History  Procedure Laterality Date  . Carotid endarterectomy  12/05/2010    right CEA  . Coronary angioplasty with stent placement  2001,2007,2008  . Finger surgery      For brown recluse spider bite  . Percutaneous placement intravascular stent cervical carotid artery Right 01-2013    History   Social History  . Marital  Status: Married    Spouse Name: N/A    Number of Children: 4  . Years of Education: N/A   Occupational History  . retired     Social History Main Topics  . Smoking status: Current Every Day Smoker -- 1.00 packs/day for 40 years    Types: Cigarettes  . Smokeless tobacco: Former Systems developer    Types: Chew     Comment: 1 to 1.5 PPD  . Alcohol Use: Yes     Comment: socially   . Drug Use: No  . Sexual Activity: Not on file   Other Topics Concern  . Not on file   Social History Narrative   HS Diploma.   Household-- wife, 2 of their children with  their families      Family History  Problem Relation Age of Onset  . Hypertension Neg Hx   . Coronary artery disease Neg Hx   . Cancer - Colon Neg Hx   . Prostate cancer Neg Hx   . Diabetes Neg Hx        Medication List       This list is accurate as of: 02/08/14 11:59 PM.  Always use your most recent med list.               aspirin 81 MG tablet  Take 81 mg by mouth daily.     atorvastatin 20 MG tablet  Commonly known as:  LIPITOR  Take 20 mg by mouth daily.     carvedilol 6.25 MG tablet  Commonly known as:  COREG  TAKE ONE TABLET BY MOUTH TWICE DAILY     clopidogrel 75 MG tablet  Commonly known as:  PLAVIX  Take 1 tablet (75 mg total) by mouth daily.     escitalopram 10 MG tablet  Commonly known as:  LEXAPRO  Take 10 mg by mouth daily.     nitroGLYCERIN 0.4 MG SL tablet  Commonly known as:  NITROSTAT  Place 1 tablet (0.4 mg total) under the tongue every 5 (five) minutes as needed for chest pain.     ramipril 2.5 MG capsule  Commonly known as:  ALTACE  Take 2.5 mg by mouth daily.     varenicline 0.5 MG X 11 & 1 MG X 42 tablet  Commonly known as:  CHANTIX STARTING MONTH PAK  Take one 0.5 mg tablet by mouth once daily for 3 days, then increase to one 0.5 mg tablet twice daily for 4 days, then increase to one 1 mg tablet twice daily.     varenicline 1 MG tablet  Commonly known as:  CHANTIX  Take 1 tablet (1 mg  total) by mouth 2 (two) times daily.           Objective:   Physical Exam BP 99/60  Pulse 61  Temp(Src) 98.2 F (36.8 C)  Ht 5' 8.2" (1.732 m)  Wt 137 lb (62.143 kg)  BMI 20.72 kg/m2  SpO2 98%  General -- alert, well-developed, NAD.  Neck --no thyromegaly  HEENT-- Not pale.  Lungs -- normal respiratory effort, no intercostal retractions, no accessory muscle use, and normal breath sounds.  Heart-- normal rate, regular rhythm, no murmur.  Abdomen-- Not distended, good bowel sounds,soft, non-tender. Rectal-- No external abnormalities noted. Normal sphincter tone. No rectal masses or tenderness. Stool brown, Hemoccult negative  Prostate--Prostate gland firm and smooth, no enlargement, nodularity, tenderness, mass, asymmetry or induration. Extremities-- no pretibial edema bilaterally  Neurologic--  alert & oriented X3. Speech normal, gait appropriate for age  Psych-- Cognition and judgment appear intact. Cooperative with normal attention span and concentration. No anxious or depressed appearing.         Assessment & Plan:

## 2014-02-08 NOTE — Assessment & Plan Note (Addendum)
Td today PNM shot today  Never had a cscope- CCS modalities discussed, provided hemocults, will think about a cscope (will need cardiac clearence) DRE normal today, check a PSA  Diet-exercise discussed  Has chantix, plans to start it soon, encouraged to do

## 2014-02-09 ENCOUNTER — Other Ambulatory Visit (INDEPENDENT_AMBULATORY_CARE_PROVIDER_SITE_OTHER): Payer: Medicare Other

## 2014-02-09 DIAGNOSIS — E7800 Pure hypercholesterolemia, unspecified: Secondary | ICD-10-CM

## 2014-02-09 DIAGNOSIS — I25810 Atherosclerosis of coronary artery bypass graft(s) without angina pectoris: Secondary | ICD-10-CM

## 2014-02-09 DIAGNOSIS — Z1329 Encounter for screening for other suspected endocrine disorder: Secondary | ICD-10-CM

## 2014-02-09 LAB — TSH: TSH: 1.84 u[IU]/mL (ref 0.35–4.50)

## 2014-02-09 LAB — COMPREHENSIVE METABOLIC PANEL
ALK PHOS: 57 U/L (ref 39–117)
ALT: 30 U/L (ref 0–53)
AST: 22 U/L (ref 0–37)
Albumin: 4 g/dL (ref 3.5–5.2)
BILIRUBIN TOTAL: 0.6 mg/dL (ref 0.2–1.2)
BUN: 10 mg/dL (ref 6–23)
CO2: 30 mEq/L (ref 19–32)
Calcium: 9.4 mg/dL (ref 8.4–10.5)
Chloride: 105 mEq/L (ref 96–112)
Creatinine, Ser: 0.7 mg/dL (ref 0.4–1.5)
GFR: 124.49 mL/min (ref >60.00)
Glucose, Bld: 94 mg/dL (ref 70–99)
Potassium: 4.3 mEq/L (ref 3.5–5.1)
SODIUM: 140 meq/L (ref 135–145)
TOTAL PROTEIN: 6.5 g/dL (ref 6.0–8.3)

## 2014-02-09 LAB — LIPID PANEL
CHOL/HDL RATIO: 4
CHOLESTEROL: 139 mg/dL (ref 0–200)
HDL: 36.3 mg/dL — AB (ref >39.00)
LDL CALC: 79 mg/dL (ref 0–99)
NonHDL: 102.7
TRIGLYCERIDES: 121 mg/dL (ref 0.0–149.0)
VLDL: 24.2 mg/dL (ref 0.0–40.0)

## 2014-02-09 LAB — CBC
HEMATOCRIT: 43.1 % (ref 39.0–52.0)
Hemoglobin: 14.4 g/dL (ref 13.0–17.0)
MCHC: 33.4 g/dL (ref 30.0–36.0)
MCV: 86.8 fl (ref 78.0–100.0)
Platelets: 200 10*3/uL (ref 150.0–400.0)
RBC: 4.96 Mil/uL (ref 4.22–5.81)
RDW: 14.5 % (ref 11.5–15.5)
WBC: 7.6 10*3/uL (ref 4.0–10.5)

## 2014-02-09 LAB — PSA: PSA: 0.48 ng/mL (ref 0.10–4.00)

## 2014-02-12 ENCOUNTER — Other Ambulatory Visit: Payer: Self-pay | Admitting: Internal Medicine

## 2014-02-12 ENCOUNTER — Other Ambulatory Visit: Payer: Self-pay | Admitting: Cardiology

## 2014-02-13 ENCOUNTER — Encounter: Payer: Self-pay | Admitting: *Deleted

## 2014-02-20 ENCOUNTER — Telehealth: Payer: Self-pay | Admitting: Internal Medicine

## 2014-02-20 ENCOUNTER — Encounter (HOSPITAL_COMMUNITY): Payer: Self-pay | Admitting: Emergency Medicine

## 2014-02-20 ENCOUNTER — Emergency Department (HOSPITAL_COMMUNITY)
Admission: EM | Admit: 2014-02-20 | Discharge: 2014-02-20 | Disposition: A | Discharge status: Home / Self Care | Payer: Medicare Other | Attending: Emergency Medicine | Admitting: Emergency Medicine

## 2014-02-20 ENCOUNTER — Emergency Department (HOSPITAL_COMMUNITY): Payer: Medicare Other

## 2014-02-20 DIAGNOSIS — R0602 Shortness of breath: Secondary | ICD-10-CM | POA: Insufficient documentation

## 2014-02-20 DIAGNOSIS — Z9861 Coronary angioplasty status: Secondary | ICD-10-CM | POA: Insufficient documentation

## 2014-02-20 DIAGNOSIS — I252 Old myocardial infarction: Secondary | ICD-10-CM | POA: Insufficient documentation

## 2014-02-20 DIAGNOSIS — I10 Essential (primary) hypertension: Secondary | ICD-10-CM | POA: Insufficient documentation

## 2014-02-20 DIAGNOSIS — F411 Generalized anxiety disorder: Secondary | ICD-10-CM | POA: Insufficient documentation

## 2014-02-20 DIAGNOSIS — Z7982 Long term (current) use of aspirin: Secondary | ICD-10-CM | POA: Insufficient documentation

## 2014-02-20 DIAGNOSIS — R079 Chest pain, unspecified: Secondary | ICD-10-CM

## 2014-02-20 DIAGNOSIS — Z79899 Other long term (current) drug therapy: Secondary | ICD-10-CM | POA: Insufficient documentation

## 2014-02-20 DIAGNOSIS — R072 Precordial pain: Secondary | ICD-10-CM | POA: Insufficient documentation

## 2014-02-20 DIAGNOSIS — Z7902 Long term (current) use of antithrombotics/antiplatelets: Secondary | ICD-10-CM | POA: Insufficient documentation

## 2014-02-20 DIAGNOSIS — E785 Hyperlipidemia, unspecified: Secondary | ICD-10-CM | POA: Insufficient documentation

## 2014-02-20 DIAGNOSIS — F17200 Nicotine dependence, unspecified, uncomplicated: Secondary | ICD-10-CM | POA: Insufficient documentation

## 2014-02-20 DIAGNOSIS — F419 Anxiety disorder, unspecified: Secondary | ICD-10-CM

## 2014-02-20 DIAGNOSIS — I2510 Atherosclerotic heart disease of native coronary artery without angina pectoris: Secondary | ICD-10-CM | POA: Insufficient documentation

## 2014-02-20 DIAGNOSIS — Z880 Allergy status to penicillin: Secondary | ICD-10-CM | POA: Insufficient documentation

## 2014-02-20 LAB — BASIC METABOLIC PANEL
BUN: 10 mg/dL (ref 6–23)
CHLORIDE: 104 meq/L (ref 96–112)
CO2: 25 mEq/L (ref 19–32)
Calcium: 9.7 mg/dL (ref 8.4–10.5)
Creatinine, Ser: 0.71 mg/dL (ref 0.50–1.35)
GFR calc Af Amer: >90 mL/min (ref >90)
GFR calc non Af Amer: >90 mL/min (ref >90)
GLUCOSE: 84 mg/dL (ref 70–99)
Potassium: 4.3 mEq/L (ref 3.7–5.3)
SODIUM: 140 meq/L (ref 137–147)

## 2014-02-20 LAB — CBC
HEMATOCRIT: 40.9 % (ref 39.0–52.0)
Hemoglobin: 13.9 g/dL (ref 13.0–17.0)
MCH: 29.4 pg (ref 26.0–34.0)
MCHC: 34 g/dL (ref 30.0–36.0)
MCV: 86.7 fL (ref 78.0–100.0)
Platelets: 193 10*3/uL (ref 150–400)
RBC: 4.72 MIL/uL (ref 4.22–5.81)
RDW: 13.8 % (ref 11.5–15.5)
WBC: 8.5 10*3/uL (ref 4.0–10.5)

## 2014-02-20 LAB — I-STAT TROPONIN, ED
Troponin i, poc: 0 ng/mL (ref 0.00–0.08)
Troponin i, poc: 0 ng/mL (ref 0.00–0.08)

## 2014-02-20 MED ORDER — ASPIRIN 81 MG PO CHEW: 324.0000 mg | CHEWABLE_TABLET | Freq: Once | ORAL | Status: DC

## 2014-02-20 MED ORDER — DIAZEPAM 5 MG PO TABS: 5.0000 mg | ORAL_TABLET | Freq: Two times a day (BID) | ORAL | Status: DC | PRN

## 2014-02-20 NOTE — Telephone Encounter (Signed)
rx faxed to pts [facility]

## 2014-02-20 NOTE — Telephone Encounter (Signed)
Valium 5 mg  Last OV- 02/08/14 Last refilled- #15/ 0 rf historical provider UDS- none

## 2014-02-20 NOTE — Telephone Encounter (Signed)
Ok RF #15

## 2014-02-20 NOTE — Telephone Encounter (Signed)
Caller name: Maris  Call back number:[phone] Pharmacy: [facility] [address]  Reason for call:  Pt wants to get a refill on diazepam (VALIUM) 5 MG tablet .  Pt states that when he saw Dr. Larose Kells, he stated he would refill the RX for the pt even though it came from the ED.  Please advise pt.

## 2014-02-20 NOTE — ED Notes (Signed)
Resident at the bedside

## 2014-02-20 NOTE — ED Provider Notes (Signed)
CSN: [id number]     Arrival date & time 02/20/14  1325 History   First MD Initiated Contact with Patient 02/20/14 1326     Chief Complaint  Patient presents with  . Chest Pain     (Consider location/radiation/quality/duration/timing/severity/associated sxs/prior Treatment) Patient is a 53 y.o. male presenting with chest pain. The history is provided by the patient.  Chest Pain Pain location:  Substernal area Pain quality: burning   Pain radiates to:  Does not radiate Pain severity:  Moderate Onset quality:  Sudden Timing:  Constant Progression:  Resolved Chronicity:  New Relieved by:  Nothing Worsened by:  Nothing tried Associated symptoms: shortness of breath (minimal briefly (couple seconds) when CP first came on.)   Associated symptoms: no abdominal pain, no back pain, no cough, no dizziness, no fever, no headache, no nausea and not vomiting     53 yo male pw chest pain. Onset about 1 hour PTA. Onset at rest. Burning central chest discomfort. Felt like "heart burn", but denies h/o reflux or heart burn. No radiation. No diaphoresis. No n/v. No numbness/tingling. Not exertional Previously feeling well.  H/o cardiac stents in past, but states this is not similar to his prior pain.  Took his normal ASA this AM. Received more ASA with EMS.  No edema or calf pain. No h/o DVT/PE.  No cough or fever.  Patient states he has h/o anxiety. Increased this week 2/2 death of close friend/family member and other increased life stressors. This episode started today while feeling anxious and improved upon resolving anxiousness.  Per reports, EMS concern for possible ST segment elevation PTA.   Per nursing note, patient endorsed 2/10 chest pain upon arrival. Patient adamant to me that his CP only lasted 10 minutes and he has been CP free since then.   Past Medical History  Diagnosis Date  . CAD (coronary artery disease)   . HTN (hypertension)   . HLD (hyperlipidemia)   . Myocardial  infarction 2001 & 2008  . Shortness of breath   . Carotid artery occlusion    Past Surgical History  Procedure Laterality Date  . Carotid endarterectomy  12/05/2010    right CEA  . Coronary angioplasty with stent placement  2001,2007,2008  . Finger surgery      For brown recluse spider bite  . Percutaneous placement intravascular stent cervical carotid artery Right 01-2013   Family History  Problem Relation Age of Onset  . Hypertension Neg Hx   . Coronary artery disease Neg Hx   . Cancer - Colon Neg Hx   . Prostate cancer Neg Hx   . Diabetes Neg Hx    History  Substance Use Topics  . Smoking status: Current Every Day Smoker -- 1.00 packs/day for 40 years    Types: Cigarettes  . Smokeless tobacco: Former Systems developer    Types: Chew     Comment: 1 to 1.5 PPD  . Alcohol Use: Yes     Comment: socially     Review of Systems  Constitutional: Negative for fever and chills.  HENT: Negative for congestion and rhinorrhea.   Respiratory: Positive for shortness of breath (minimal briefly (couple seconds) when CP first came on.). Negative for cough.   Cardiovascular: Positive for chest pain. Negative for leg swelling.  Gastrointestinal: Negative for nausea, vomiting, abdominal pain and diarrhea.  Genitourinary: Negative for flank pain and difficulty urinating.  Musculoskeletal: Negative for back pain.  Neurological: Negative for dizziness and headaches.  Psychiatric/Behavioral: The patient is nervous/anxious.  All other systems reviewed and are negative.     Allergies  Penicillins  Home Medications   Prior to Admission medications   Medication Sig Start Date End Date Taking? Authorizing Provider  aspirin 81 MG tablet Take 81 mg by mouth daily.     Yes Historical Provider, MD  atorvastatin (LIPITOR) 20 MG tablet Take 20 mg by mouth daily.   Yes Historical Provider, MD  carvedilol (COREG) 6.25 MG tablet Take 6.25 mg by mouth 2 (two) times daily with a meal.   Yes Historical Provider,  MD  clopidogrel (PLAVIX) 75 MG tablet Take 1 tablet (75 mg total) by mouth daily. 08/26/13  Yes Larey Dresser, MD  escitalopram (LEXAPRO) 10 MG tablet Take 10 mg by mouth daily.   Yes Historical Provider, MD  nitroGLYCERIN (NITROSTAT) 0.4 MG SL tablet Place 1 tablet (0.4 mg total) under the tongue every 5 (five) minutes as needed for chest pain. 09/12/13  Yes Larey Dresser, MD  diazepam (VALIUM) 5 MG tablet Take 1 tablet (5 mg total) by mouth every 12 (twelve) hours as needed for anxiety. 02/20/14   Colon Branch, MD   BP 106/56  Pulse 53  Temp(Src) 98.6 F (37 C) (Oral)  Resp 16  SpO2 99% Physical Exam  Nursing note and vitals reviewed. Constitutional: He is oriented to person, place, and time. He appears well-developed and well-nourished. No distress.  Sitting up in bed. NAD. Speaks in full sentences.  HENT:  Head: Normocephalic and atraumatic.  Eyes: Conjunctivae are normal. Right eye exhibits no discharge. Left eye exhibits no discharge.  Neck: No tracheal deviation present.  Cardiovascular: Normal rate, regular rhythm, normal heart sounds and intact distal pulses.   Pulmonary/Chest: Effort normal and breath sounds normal. No stridor. No respiratory distress. He has no wheezes. He has no rales.  Abdominal: Soft. He exhibits no distension. There is no tenderness. There is no guarding.  Musculoskeletal: He exhibits no edema and no tenderness.  Neurological: He is alert and oriented to person, place, and time.  Skin: Skin is warm and dry.  Psychiatric: He has a normal mood and affect. His behavior is normal.    ED Course  Procedures (including critical care time) Labs Review Labs Reviewed  CBC  BASIC METABOLIC PANEL  I-STAT [place], ED  [facility], ED    Imaging Review Dg Chest Port 1 View  02/20/2014   CLINICAL DATA:  Chest pain.  EXAM: PORTABLE CHEST - 1 VIEW  COMPARISON:  12/09/2013.  FINDINGS: The cardiac silhouette, mediastinal and hilar contours are normal and  stable. There is mild tortuosity and calcification of the thoracic aorta. The lungs are clear. No pleural effusion. The bony thorax is intact.  IMPRESSION: No acute cardiopulmonary findings.   Electronically Signed   By: Kalman Jewels M.D.   On: 02/20/2014 14:23     EKG Interpretation   Date/Time:  Monday February 20 2014 13:32:53 EDT Ventricular Rate:  57 PR Interval:  146 QRS Duration: 88 QT Interval:  404 QTC Calculation: 393 R Axis:   85 Text Interpretation:  Sinus bradycardia with sinus arrhythmia Otherwise  normal ECG no significant change since 2011 Confirmed by Christy Gentles  MD,  DONALD ([id number]) on 02/20/2014 1:42:32 PM      MDM   Final diagnoses:  Chest pain  Anxiety   53 yo male pw chest pain.  H/o cardiac stent in the past.  Very atypical chest pain. NOT similar to prior cardiac pain. Central burning. Not exertional.  Without features to suggest cardiac.  Doubt PE, dissection, ptx, pna or other emergent pathology. EKG without STEMI or other findings to suggest ischemia (although reportedly with some concerns on pre-hospital EKG by EMS. Did not appreciate ischemia on their EKG either). Troponin negative.   Discussed results with patient and spouse. Low suspicion for ACS. However, given his history unable to fully exclude. Discussed option of being admitted to hospital for further mgmt versus arranging close follow up for patient. After discussing risks/benefits of both, patient and family agree to close follow up after 3 hour delta troponin. Shared decision making.   Patient to contact PCP office first thing tomorrow morning to arrange follow up within 24-48 hours.   Delta troponin negative.   Patient discharged home. Return precautions given. To follow up with pcp. Patient and spouse in agreement with plan.  Labs and imaging reviewed by myself and considered in medical decision making if ordered. Imaging interpreted by radiology.   Discussed case with Dr. Christy Gentles who is in  agreement with assessment and plan.    Bonnita Hollow, MD 02/20/14 [phone]

## 2014-02-20 NOTE — Discharge Instructions (Signed)
Chest Pain (Nonspecific) °It is often hard to give a specific diagnosis for the cause of chest pain. There is always a chance that your pain could be related to something serious, such as a heart attack or a blood clot in the lungs. You need to follow up with your caregiver for further evaluation. °CAUSES  °· Heartburn. °· Pneumonia or bronchitis. °· Anxiety or stress. °· Inflammation around your heart (pericarditis) or lung (pleuritis or pleurisy). °· A blood clot in the lung. °· A collapsed lung (pneumothorax). It can develop suddenly on its own (spontaneous pneumothorax) or from injury (trauma) to the chest. °· Shingles infection (herpes zoster virus). °The chest wall is composed of bones, muscles, and cartilage. Any of these can be the source of the pain. °· The bones can be bruised by injury. °· The muscles or cartilage can be strained by coughing or overwork. °· The cartilage can be affected by inflammation and become sore (costochondritis). °DIAGNOSIS  °Lab tests or other studies, such as X-rays, electrocardiography, stress testing, or cardiac imaging, may be needed to find the cause of your pain.  °TREATMENT  °· Treatment depends on what may be causing your chest pain. Treatment may include: °· Acid blockers for heartburn. °· Anti-inflammatory medicine. °· Pain medicine for inflammatory conditions. °· Antibiotics if an infection is present. °· You may be advised to change lifestyle habits. This includes stopping smoking and avoiding alcohol, caffeine, and chocolate. °· You may be advised to keep your head raised (elevated) when sleeping. This reduces the chance of acid going backward from your stomach into your esophagus. °· Most of the time, nonspecific chest pain will improve within 2 to 3 days with rest and mild pain medicine. °HOME CARE INSTRUCTIONS  °· If antibiotics were prescribed, take your antibiotics as directed. Finish them even if you start to feel better. °· For the next few days, avoid physical  activities that bring on chest pain. Continue physical activities as directed. °· Do not smoke. °· Avoid drinking alcohol. °· Only take over-the-counter or prescription medicine for pain, discomfort, or fever as directed by your caregiver. °· Follow your caregiver's suggestions for further testing if your chest pain does not go away. °· Keep any follow-up appointments you made. If you do not go to an appointment, you could develop lasting (chronic) problems with pain. If there is any problem keeping an appointment, you must call to reschedule. °SEEK MEDICAL CARE IF:  °· You think you are having problems from the medicine you are taking. Read your medicine instructions carefully. °· Your chest pain does not go away, even after treatment. °· You develop a rash with blisters on your chest. °SEEK IMMEDIATE MEDICAL CARE IF:  °· You have increased chest pain or pain that spreads to your arm, neck, jaw, back, or abdomen. °· You develop shortness of breath, an increasing cough, or you are coughing up blood. °· You have severe back or abdominal pain, feel nauseous, or vomit. °· You develop severe weakness, fainting, or chills. °· You have a fever. °THIS IS AN EMERGENCY. Do not wait to see if the pain will go away. Get medical help at once. Call your local emergency services (911 in [place] Do not drive yourself to the hospital. °MAKE SURE YOU:  °· Understand these instructions. °· Will watch your condition. °· Will get help right away if you are not doing well or get worse. °Document Released: 06/04/2005 Document Revised: 11/17/2011 Document Reviewed: 03/30/2008 °Exit[employer]® Patient Information ©2014 [employer]   LL[employer].  Panic Attacks Panic attacks are sudden, short-livedsurges of severe anxiety, fear, or discomfort. They may occur for no reason when you are relaxed, when you are anxious, or when you are sleeping. Panic attacks may occur for a number of reasons:   Healthy people occasionally have panic attacks in extreme,  life-threatening situations, such as war or natural disasters. Normal anxiety is a protective mechanism of the body that helps [place] react to danger (fight or flight response).  Panic attacks are often seen with anxiety disorders, such as panic disorder, social anxiety disorder, generalized anxiety disorder, and phobias. Anxiety disorders cause excessive or uncontrollable anxiety. They may interfere with your relationships or other life activities.  Panic attacks are sometimes seen with other mental illnesses such as depression and posttraumatic stress disorder.  Certain medical conditions, prescription medicines, and drugs of abuse can cause panic attacks. SYMPTOMS  Panic attacks start suddenly, peak within 20 minutes, and are accompanied by four or more of the following symptoms:  Pounding heart or fast heart rate (palpitations).  Sweating.  Trembling or shaking.  Shortness of breath or feeling smothered.  Feeling choked.  Chest pain or discomfort.  Nausea or strange feeling in your stomach.  Dizziness, lightheadedness, or feeling like you will faint.  Chills or hot flushes.  Numbness or tingling in your lips or hands and feet.  Feeling that things are not real or feeling that you are not yourself.  Fear of losing control or going crazy.  Fear of dying. Some of these symptoms can mimic serious medical conditions. For example, you may think you are having a heart attack. Although panic attacks can be very scary, they are not life threatening. DIAGNOSIS  Panic attacks are diagnosed through an assessment by your health care provider. Your health care provider will ask questions about your symptoms, such as where and when they occurred. Your health care provider will also ask about your medical history and use of alcohol and drugs, including prescription medicines. Your health care provider may order blood tests or other studies to rule out a serious medical condition. Your health  care provider may refer you to a mental health professional for further evaluation. TREATMENT   Most healthy people who have one or two panic attacks in an extreme, life-threatening situation will not require treatment.  The treatment for panic attacks associated with anxiety disorders or other mental illness typically involves counseling with a mental health professional, medicine, or a combination of both. Your health care provider will help determine what treatment is best for you.  Panic attacks due to physical illness usually goes away with treatment of the illness. If prescription medicine is causing panic attacks, talk with your health care provider about stopping the medicine, decreasing the dose, or substituting another medicine.  Panic attacks due to alcohol or drug abuse goes away with abstinence. Some adults need professional help in order to stop drinking or using drugs. HOME CARE INSTRUCTIONS   Take all your medicines as prescribed.   Check with your health care provider before starting new prescription or over-the-counter medicines.  Keep all follow up appointments with your health care provider. SEEK MEDICAL CARE IF:  You are not able to take your medicines as prescribed.  Your symptoms do not improve or get worse. SEEK IMMEDIATE MEDICAL CARE IF:   You experience panic attack symptoms that are different than your usual symptoms.  You have serious thoughts about hurting yourself or others.  You are taking medicine for  panic attacks and have a serious side effect. MAKE SURE YOU:  Understand these instructions.  Will watch your condition.  Will get help right away if you are not doing well or get worse. Document Released: 08/25/2005 Document Revised: 06/15/2013 Document Reviewed: 04/08/2013 [facility] Patient Information 2014 [place].

## 2014-02-20 NOTE — ED Notes (Signed)
Per EMS, Patient was waiting on his car to be fixed at the shop and started to has sharp, substernal chest pain 8/10. Patient reports diaphoresis. Denies and Nausea, Vomiting, and no radiation. Pain has reduced and upon arrival patient's pain is a 2/10. EKG showed ST elevation. Vitals per EMS: 116/68, 50 HR, 18 RR, 98 % on RA. Patient has a history of chest pain, stent placement, high cholesterol, HTN, and smoking.

## 2014-02-20 NOTE — ED Notes (Signed)
Patient helped up to the bathroom.

## 2014-02-20 NOTE — ED Notes (Signed)
Resident stated, "No need to give the aspririn since patient received aspirin with EMS."

## 2014-02-20 NOTE — ED Provider Notes (Signed)
Patient seen/examined in the Emergency Department in conjunction with Resident Physician Provider mclennan Patient reports chest burning, now resolved Exam : awake/alert, no distress, resting comfortably Plan: imaging/labs pending at this time.  Pt with h/o CAD   EKG Interpretation  Date/Time:  Monday February 20 2014 13:32:53 EDT Ventricular Rate:  57 PR Interval:  146 QRS Duration: 88 QT Interval:  404 QTC Calculation: 393 R Axis:   85 Text Interpretation:  Sinus bradycardia with sinus arrhythmia Otherwise normal ECG no significant change since 2011 Confirmed by Christy Gentles  MD, Antaeus Karel ([id number]) on 02/20/2014 1:42:32 PM         Sharyon Cable, MD 02/20/14 1426

## 2014-02-21 ENCOUNTER — Telehealth: Payer: Self-pay | Admitting: Internal Medicine

## 2014-02-21 NOTE — Telephone Encounter (Signed)
Patient was seen yesterday at the ER with chest pain, needs a prompt f/u, please arrange

## 2014-02-21 NOTE — Telephone Encounter (Signed)
Patient scheduled for 02/23/14 at 9:30.

## 2014-02-22 NOTE — ED Provider Notes (Signed)
I have personally seen and examined the patient.  I have discussed the plan of care with the resident.  I have reviewed the documentation on PMH/FH/Soc. History.  I have reviewed the documentation of the resident and agree.  Pt felt this CP was not similar to prior cardiac pain and he reported it felt more like anxiety  Pt stable in the ED  Sharyon Cable, MD 02/22/14 2001

## 2014-02-23 ENCOUNTER — Encounter: Payer: Self-pay | Admitting: Internal Medicine

## 2014-02-23 ENCOUNTER — Ambulatory Visit (INDEPENDENT_AMBULATORY_CARE_PROVIDER_SITE_OTHER): Payer: Medicare Other | Admitting: Internal Medicine

## 2014-02-23 VITALS — BP 94/61 | HR 60 | Temp 98.0°F | Wt 136.0 lb

## 2014-02-23 DIAGNOSIS — I2510 Atherosclerotic heart disease of native coronary artery without angina pectoris: Secondary | ICD-10-CM

## 2014-02-23 DIAGNOSIS — I25811 Atherosclerosis of native coronary artery of transplanted heart without angina pectoris: Secondary | ICD-10-CM

## 2014-02-23 DIAGNOSIS — F419 Anxiety disorder, unspecified: Secondary | ICD-10-CM

## 2014-02-23 DIAGNOSIS — F411 Generalized anxiety disorder: Secondary | ICD-10-CM

## 2014-02-23 MED ORDER — ESCITALOPRAM OXALATE 10 MG PO TABS: 15.0000 mg | ORAL_TABLET | Freq: Every day | ORAL | Status: DC

## 2014-02-23 NOTE — Assessment & Plan Note (Signed)
History of CAD, recent chest pain unlikely to be heart related

## 2014-02-23 NOTE — Progress Notes (Signed)
Pre visit review using our clinic review tool, if applicable. No additional management support is needed unless otherwise documented below in the visit note. 

## 2014-02-23 NOTE — Progress Notes (Signed)
Subjective:    Patient ID: Aaron Velez, male    DOB: [birth year], 53 y.o.   MRN: [id number]  DOS:  02/23/2014 Type of  Visit: ER followup, here with his wife History: Martin Majestic to the ER 3 days ago with sudden onset of a burning feeling in the throat with radiation down to the chest. Patient was panicking over the symptoms, thinking they were a   heart attack although the symptoms were quite different from previous CAD ER records reviewed, troponins negative, EKG without acute changes, chest x-ray negative, BMP normal. No further symptoms.   ROS On further questioning, the patient states that has been under a lot of stress lately, lost a friend few days ago. He and his wife think he may need a higher  SSRI dose . As far as burning in the chest, again if symptoms have not resurface , he does not have any type of GERD  symptoms at all. No dysphagia or odynophagia. He sleeps okay.  Past Medical History  Diagnosis Date  . CAD (coronary artery disease)   . HTN (hypertension)   . HLD (hyperlipidemia)   . Myocardial infarction 2001 & 2008  . Shortness of breath   . Carotid artery occlusion     Past Surgical History  Procedure Laterality Date  . Carotid endarterectomy  12/05/2010    right CEA  . Coronary angioplasty with stent placement  2001,2007,2008  . Finger surgery      For brown recluse spider bite  . Percutaneous placement intravascular stent cervical carotid artery Right 01-2013    History   Social History  . Marital Status: Married    Spouse Name: N/A    Number of Children: 4  . Years of Education: N/A   Occupational History  . retired     Social History Main Topics  . Smoking status: Current Every Day Smoker -- 1.00 packs/day for 40 years    Types: Cigarettes  . Smokeless tobacco: Former Systems developer    Types: Chew     Comment: 1 to 1.5 PPD  . Alcohol Use: Yes     Comment: socially   . Drug Use: No  . Sexual Activity: Not on file   Other Topics Concern  . Not on  file   Social History Narrative   HS Diploma.   Household-- wife, 2 of their children with  their families         Medication List       This list is accurate as of: 02/23/14  3:32 PM.  Always use your most recent med list.               aspirin 81 MG tablet  Take 81 mg by mouth daily.     atorvastatin 20 MG tablet  Commonly known as:  LIPITOR  Take 20 mg by mouth daily.     carvedilol 6.25 MG tablet  Commonly known as:  COREG  Take 6.25 mg by mouth 2 (two) times daily with a meal.     clopidogrel 75 MG tablet  Commonly known as:  PLAVIX  Take 1 tablet (75 mg total) by mouth daily.     diazepam 5 MG tablet  Commonly known as:  VALIUM  Take 1 tablet (5 mg total) by mouth every 12 (twelve) hours as needed for anxiety.     escitalopram 10 MG tablet  Commonly known as:  LEXAPRO  Take 1.5 tablets (15 mg total) by mouth daily.  nitroGLYCERIN 0.4 MG SL tablet  Commonly known as:  NITROSTAT  Place 1 tablet (0.4 mg total) under the tongue every 5 (five) minutes as needed for chest pain.           Objective:   Physical Exam BP 94/61  Pulse 60  Temp(Src) 98 F (36.7 C)  Wt 136 lb (61.689 kg)  SpO2 100% General -- alert, well-developed, NAD.  Lungs -- normal respiratory effort, no intercostal retractions, no accessory muscle use, and normal breath sounds.  Heart-- normal rate, regular rhythm, no murmur.   Extremities-- no pretibial edema bilaterally  Neurologic--  alert & oriented X3. Speech normal, gait appropriate for age, strength symmetric and appropriate for age.     Psych-- Cognition and judgment appear intact. Cooperative with normal attention span and concentration. No anxious or depressed appearing.        Assessment & Plan:   Chest pain, resolved, atypical. I don't think further workup is needed at this time  Today , I spent more than 25   min with the patient: >50% of the time counseling regards anxiety, discussing need to increase  (or not)  SSRIs Also reviewing the chart and labs ordered by other providers

## 2014-02-23 NOTE — Assessment & Plan Note (Signed)
Lately his symptoms have increased, he is having panic episodes . I agreed to increase Lexapro from 10 mg to 15 mg by mouth Also encouraged to take valium w/ him and  use as needed.

## 2014-02-23 NOTE — Patient Instructions (Signed)
Take medicines as prescribed Next visit in 2 months

## 2014-02-28 NOTE — Telephone Encounter (Signed)
Done

## 2014-03-02 ENCOUNTER — Ambulatory Visit: Payer: Medicare Other | Admitting: Internal Medicine

## 2014-03-02 ENCOUNTER — Ambulatory Visit (INDEPENDENT_AMBULATORY_CARE_PROVIDER_SITE_OTHER): Payer: Medicare Other | Admitting: Cardiology

## 2014-03-02 ENCOUNTER — Encounter: Payer: Self-pay | Admitting: Cardiology

## 2014-03-02 VITALS — BP 112/62 | HR 60 | Ht 68.2 in | Wt 135.0 lb

## 2014-03-02 DIAGNOSIS — I25119 Atherosclerotic heart disease of native coronary artery with unspecified angina pectoris: Secondary | ICD-10-CM

## 2014-03-02 DIAGNOSIS — R079 Chest pain, unspecified: Secondary | ICD-10-CM

## 2014-03-02 DIAGNOSIS — F17200 Nicotine dependence, unspecified, uncomplicated: Secondary | ICD-10-CM

## 2014-03-02 DIAGNOSIS — I2510 Atherosclerotic heart disease of native coronary artery without angina pectoris: Secondary | ICD-10-CM

## 2014-03-02 DIAGNOSIS — I209 Angina pectoris, unspecified: Secondary | ICD-10-CM

## 2014-03-02 DIAGNOSIS — I6529 Occlusion and stenosis of unspecified carotid artery: Secondary | ICD-10-CM

## 2014-03-02 NOTE — Patient Instructions (Signed)
Your physician recommends that you continue on your current medications as directed. Please refer to the Current Medication list given to you today.  Your physician has requested that you have en exercise stress myoview. For further information please visit [email or web address]. Please follow instruction sheet, as given.  Your physician recommends that you schedule a follow-up appointment in: IF EXERCISE MYOVIEW COMES BACK NORMAL, THEN FOLLOW-UP IN 6 MONTHS

## 2014-03-02 NOTE — Progress Notes (Signed)
Patient ID: Aaron Velez, male   DOB: Mar 25, 1961, 53 y.o.   MRN: [id number] PCP: Dr. Larose Kells  53 yo with history of CAD and smoking presents for cardiology followup.  He had BMS to the RCA in 2001, BMS to the CFX in 2007, then had stent thrombosis in the RCA stent in 5/08 with inferior STEMI. No exertional dyspnea.  He walks his dog daily.  He continues to smoke 1 ppd.  No claudication symptoms, no stroke-like symptoms.  In 5/14, he had a right carotid stent placed.    He had been doing well without chest pain until about 1 week ago.  He was under a lot of stress, a good friend died recently.  He was out getting his wife's truck inspected and developed a burning in his chest that radiated to the neck, somewhat reminiscent of prior ischemic pain.  The pain lasted 10-20 minutes. He was evaluated in the ER and sent home after having negative troponin x 2 and unremarkable ECG.  Since then, he has had no chest pain.   Labs (10/12): creatinine 0.73 Labs (3/13): K 4.2, creatinine 0.9, LDL 115, HDL 37 Labs (3/14): LDL 66, HDL 25, LFTs normal Labs (6/15): K 4.3, creatinine 0.71, TSH normal, LDL 79, HDL 36  PMH: 1. CAD: BMS to RCA in 2001.  Unstable angina with BMS to CFX in 8/07.  Inferior MI in 5/08 due to stent thrombosis in RCA, had PTCA.  EF 60% with inferior hypokinesis on 5/08 cath.  2. Erb's palsy 3. HTN 4. Anxiety 5. Carotid stenosis: right CEA in 3/12.  Carotid dopplers (4/13) with 40-59% RICA stenosis.  Right carotid stent placed in 5/14.  12/14 carotid dopplers with < 75% LICA stenosis and patent RICA stent.  6. Hyperlipidemia 7. Active smoker: Failed Chantix and Wellbutrin.  8. Rotator cuff strain/tear   SH: Married, lives in [place].  Smokes 1 ppd.  On disability.   FH: CAD  ROS: All systems reviewed and negative except as per HPI.   Current Outpatient Prescriptions  Medication Sig Dispense Refill  . aspirin 81 MG tablet Take 81 mg by mouth daily.        Marland Kitchen atorvastatin (LIPITOR)  20 MG tablet Take 20 mg by mouth daily.      . carvedilol (COREG) 6.25 MG tablet Take 6.25 mg by mouth 2 (two) times daily with a meal.      . clopidogrel (PLAVIX) 75 MG tablet Take 1 tablet (75 mg total) by mouth daily.  30 tablet  5  . diazepam (VALIUM) 5 MG tablet Take 1 tablet (5 mg total) by mouth every 12 (twelve) hours as needed for anxiety.  15 tablet  0  . escitalopram (LEXAPRO) 10 MG tablet Take 1.5 tablets (15 mg total) by mouth daily.  60 tablet  3  . nitroGLYCERIN (NITROSTAT) 0.4 MG SL tablet Place 1 tablet (0.4 mg total) under the tongue every 5 (five) minutes as needed for chest pain.  25 tablet  3  . ramipril (ALTACE) 2.5 MG capsule Take 2.5 mg by mouth daily.       No current facility-administered medications for this visit.    BP 112/62  Pulse 60  Ht 5' 8.2" (1.732 m)  Wt 135 lb (61.236 kg)  BMI 20.41 kg/m2  SpO2 94% General: NAD, thin Neck: No JVD, no thyromegaly or thyroid nodule.  Lungs: Mildly decreased breath sounds throughout.  CV: Nondisplaced PMI.  Heart regular S1/S2, no S3/S4, 1/6 SEM.  No  peripheral edema.  Left carotid bruit.  Normal pedal pulses.  Abdomen: Soft, nontender, no hepatosplenomegaly, no distention.  Neurologic: Alert and oriented x 3.  Psych: Normal affect. Extremities: No clubbing or cyanosis.   Assessment/Plan: 1. CAD: Episode of chest pain about 1 week ago that was fairly severe but has not recurred.   - Continue ASA, statin, Coreg, ramipril.   - I will have him continue Plavix long-term given history of stent thrombosis.  - Given chest pain, I will arrange for ETT-Cardiolite.  If Cardiolite is normal, I will see him back in 6 months unless he has recurrent concerning chest pain.  2. Carotid stenosis: Followed at [facility]. He had right carotid stent placed in 5/14.  3. Hyperlipidemia: Goal LDL < 70.  4. Smoking: Still smoking 1 ppd.  I strongly encouraged him to quit. He is going to try Chantix again.  It worked for a while in the past.    Loralie Champagne 03/02/2014

## 2014-03-07 ENCOUNTER — Encounter: Payer: Self-pay | Admitting: Family

## 2014-03-08 ENCOUNTER — Encounter: Payer: Self-pay | Admitting: Internal Medicine

## 2014-03-08 ENCOUNTER — Ambulatory Visit (HOSPITAL_COMMUNITY)
Admission: RE | Admit: 2014-03-08 | Discharge: 2014-03-08 | Disposition: A | Discharge status: Home / Self Care | Payer: Medicare Other | Source: Ambulatory Visit | Attending: Family | Admitting: Family

## 2014-03-08 ENCOUNTER — Encounter: Payer: Self-pay | Admitting: Family

## 2014-03-08 ENCOUNTER — Ambulatory Visit (INDEPENDENT_AMBULATORY_CARE_PROVIDER_SITE_OTHER): Payer: Medicare Other | Admitting: Family

## 2014-03-08 VITALS — BP 107/70 | HR 50 | Resp 14 | Ht 69.5 in | Wt 137.0 lb

## 2014-03-08 DIAGNOSIS — Z48812 Encounter for surgical aftercare following surgery on the circulatory system: Secondary | ICD-10-CM

## 2014-03-08 DIAGNOSIS — I6529 Occlusion and stenosis of unspecified carotid artery: Secondary | ICD-10-CM | POA: Insufficient documentation

## 2014-03-08 HISTORY — DX: Encounter for surgical aftercare following surgery on the circulatory system: Z48.812

## 2014-03-08 NOTE — Progress Notes (Signed)
Established Carotid Patient   History of Present Illness  Aaron Velez is a 53 y.o. male patient of Dr. Trula Slade who is status post right carotid stent on 01/18/2013. He has previously undergone right carotid endarterectomy on 12/05/2010. Carotid artery duplex in June, 2014 showed a widely patent right carotid and a velocity elevation within the common carotid artery on the left. This was defined as about 65% on angiography.  He returns today for carotid artery surveillance.  He is not aware of any history of stroke or TIA.  He denies claudication symptoms, denies non-healing wounds, denies steal symptoms in either arm.  Congenital Erb's Palsy affecting left arm.  Denies New Medical or Surgical History. He had an MI in 2001, stent collapsed in 2008 and repaired. He plans to stop smoking July 6 with Chantix, Lexapro daily and diazepam prn are helping his anxiety.  He denies any new TIA or stroke activity. He had a bad panic attack in the last few months, he activated EMS and was evaluated in the ED, negative cardiac work up per pt, states he is scheduled for a stress test tomorrow.   Pt Diabetic: No  Pt smoker: smoker (1 ppd x 30 yrs)  Pt meds include:  Statin : Yes  Betablocker: Yes  ASA: Yes  Other anticoagulants/antiplatelets: Plavix    Past Medical History  Diagnosis Date  . CAD (coronary artery disease)   . HTN (hypertension)   . HLD (hyperlipidemia)   . Myocardial infarction 2001 & 2008  . Shortness of breath   . Carotid artery occlusion     Social History History  Substance Use Topics  . Smoking status: Current Every Day Smoker -- 1.00 packs/day for 40 years    Types: Cigarettes  . Smokeless tobacco: Former Systems developer    Types: [place] date: 09/08/1978     Comment: 1 to 1.5 PPD  . Alcohol Use: Yes     Comment: socially     Family History Family History  Problem Relation Age of Onset  . Hypertension      family history  . Coronary artery disease       family history  . Cancer - Colon Neg Hx   . Prostate cancer Neg Hx   . Diabetes Neg Hx   . Hyperlipidemia Mother   . Hypertension Mother     Surgical History Past Surgical History  Procedure Laterality Date  . Carotid endarterectomy  12/05/2010    right CEA  . Coronary angioplasty with stent placement  2001,2007,2008  . Finger surgery      For brown recluse spider bite  . Percutaneous placement intravascular stent cervical carotid artery Right 01-2013    Allergies  Allergen Reactions  . Penicillins Rash    Burning pain and rash    Current Outpatient Prescriptions  Medication Sig Dispense Refill  . aspirin 81 MG tablet Take 81 mg by mouth daily.        Marland Kitchen atorvastatin (LIPITOR) 20 MG tablet Take 20 mg by mouth daily.      . carvedilol (COREG) 6.25 MG tablet Take 6.25 mg by mouth 2 (two) times daily with a meal.      . clopidogrel (PLAVIX) 75 MG tablet Take 1 tablet (75 mg total) by mouth daily.  30 tablet  5  . diazepam (VALIUM) 5 MG tablet Take 1 tablet (5 mg total) by mouth every 12 (twelve) hours as needed for anxiety.  15 tablet  0  .  escitalopram (LEXAPRO) 10 MG tablet Take 1.5 tablets (15 mg total) by mouth daily.  60 tablet  3  . nitroGLYCERIN (NITROSTAT) 0.4 MG SL tablet Place 1 tablet (0.4 mg total) under the tongue every 5 (five) minutes as needed for chest pain.  25 tablet  3  . ramipril (ALTACE) 2.5 MG capsule Take 2.5 mg by mouth daily.       No current facility-administered medications for this visit.    Review of Systems : See HPI for pertinent positives and negatives.  Physical Examination   Filed Vitals:   03/08/14 1103 03/08/14 1105  BP: 104/62 107/70  Pulse: 49 50  Resp:  14  Height:  5' 9.5" (1.765 m)  Weight:  137 lb (62.143 kg)  SpO2:  100%   Body mass index is 19.95 kg/(m^2).   General: WDWN male in NAD  GAIT: normal  Eyes: PERRLA  Pulmonary: CTAB, Negative Rales, Negative rhonchi, & Negative wheezing.  Cardiac: regular Rhythm , Negative  Murmurs.   VASCULAR EXAM  Carotid Bruits  Left  Right    Positive  Negative   Radial pulses are 2+ palpable and equal.  LE Pulses  LEFT  RIGHT   POPLITEAL  Not palpable  Not palpable   POSTERIOR TIBIAL  palpable  palpable   DORSALIS PEDIS  ANTERIOR TIBIAL  palpable  palpable    Gastrointestinal: soft, nontender, BS WNL, no r/g, negative masses.  Musculoskeletal: Positive muscle atrophy/wasting in left arm secondary to Erb's Palsy. M/S 5/5 throughout except unable to raise left arm to shoulder level, 2/5 M/S LUE, Extremities without ischemic changes.  Neurologic: A&O X 3; Appropriate Affect ; SENSATION ;normal except diminished in left arm;  Speech is normal  CN 2-12 intact, Pain and light touch intact in extremities, Motor exam as listed above.  Non-Invasive Vascular Imaging CAROTID DUPLEX 03/08/2014   CEREBROVASCULAR DUPLEX EVALUATION     INDICATION: Carotid artery disease; evaluation status post stent placement.    PREVIOUS INTERVENTION(S): Right carotid endarterectomy 12/05/2010 with subsequent stent placed in the common carotid artery 01/18/2013    DUPLEX EXAM:     RIGHT  LEFT  Peak Systolic Velocities (cm/s) End Diastolic Velocities (cm/s) Plaque LOCATION Peak Systolic Velocities (cm/s) End Diastolic Velocities (cm/s) Plaque  134 38  CCA PROXIMAL 140 49 HT     CCA MID 72 31 HT  80 23  CCA DISTAL 68 27 HT  116 17  ECA 59 15 HT  90 26  ICA PROXIMAL 64 29 HT  93 38  ICA MID 77 39   104 37  ICA DISTAL 76 38     NA ICA / CCA Ratio (PSV) .94  Antegrade Vertebral Flow Antegrade   88 Brachial Systolic Pressure (mmHg) 92  Within normal limits  Brachial Artery Waveforms Within normal limits     Peak Systolic Velocities (cm/s) End Diastolic Velocities (cm/s) Plaque STENT (Right ) Peak Systolic Velocities (cm/s) End Diastolic Velocities (cm/s) Plaque  90 30  PROXIMAL     101 31  MID     98 26  DISTAL       Plaque Morphology:  HM = Homogeneous, HT = Heterogeneous, CP = Calcific  Plaque, SP = Smooth Plaque, IP = Irregular Plaque    ADDITIONAL FINDINGS: See impression on following page.             IMPRESSION: 1. Widely patent right carotid endarterectomy and common carotid stent without evidence of restenosis or hyperplasia.  2. Mild (<40%)  stenosis of the left internal carotid artery. 3. Bilateral vertebral artery is antegrade.      Compared to the previous exam:  No significant change compared to prior exam.    Assessment: Aaron Velez is a 53 y.o. male who is s/p right carotid endarterectomy 12/05/2010 with subsequent stent placed in the common carotid artery 01/18/2013. He presents with asymptomatic widely patent right carotid endarterectomy and common carotid stent without evidence of restenosis or hyperplasia.  Mild (<40%) stenosis of the left internal carotid artery. No significant change compared to prior exam.   Plan: Follow-up in 6 months with Carotid Duplex scan. Will Duplex carotid arteries every 6 months for the first 2 years post operatively, then yearly if stable.  He plans to start Chantix on July 6, seems committed to stop smoking.  I discussed in depth with the patient the nature of atherosclerosis, and emphasized the importance of maximal medical management including strict control of blood pressure, blood glucose, and lipid levels, obtaining regular exercise, and cessation of smoking.  The patient is aware that without maximal medical management the underlying atherosclerotic disease process will progress, limiting the benefit of any interventions. The patient was given information about stroke prevention and what symptoms should prompt the patient to seek immediate medical care. Thank you for allowing [place] to participate in this patient's care.  Clemon Chambers, RN, MSN, FNP-C [facility] Office: [phone]  Clinic Physician: Scot Dock  03/08/2014 11:14 AM

## 2014-03-08 NOTE — Patient Instructions (Signed)
Stroke Prevention Some medical conditions and behaviors are associated with an increased chance of having a stroke. You may prevent a stroke by making healthy choices and managing medical conditions. HOW CAN I REDUCE MY RISK OF HAVING A STROKE?   Stay physically active. Get at least 30 minutes of activity on most or all days.  Do not smoke. It may also be helpful to avoid exposure to secondhand smoke.  Limit alcohol use. Moderate alcohol use is considered to be:  No more than 2 drinks per day for men.  No more than 1 drink per day for nonpregnant women.  Eat healthy foods. This involves  Eating 5 or more servings of fruits and vegetables a day.  Following a diet that addresses high blood pressure (hypertension), high cholesterol, diabetes, or obesity.  Manage your cholesterol levels.  A diet low in saturated fat, trans fat, and cholesterol and high in fiber may control cholesterol levels.  Take any prescribed medicines to control cholesterol as directed by your health care provider.  Manage your diabetes.  A controlled-carbohydrate, controlled-sugar diet is recommended to manage diabetes.  Take any prescribed medicines to control diabetes as directed by your health care provider.  Control your hypertension.  A low-salt (sodium), low-saturated fat, low-trans fat, and low-cholesterol diet is recommended to manage hypertension.  Take any prescribed medicines to control hypertension as directed by your health care provider.  Maintain a healthy weight.  A reduced-calorie, low-sodium, low-saturated fat, low-trans fat, low-cholesterol diet is recommended to manage weight.  Stop drug abuse.  Avoid taking birth control pills.  Talk to your health care provider about the risks of taking birth control pills if you are over 35 years old, smoke, get migraines, or have ever had a blood clot.  Get evaluated for sleep disorders (sleep apnea).  Talk to your health care provider about  getting a sleep evaluation if you snore a lot or have excessive sleepiness.  Take medicines as directed by your health care provider.  For some people, aspirin or blood thinners (anticoagulants) are helpful in reducing the risk of forming abnormal blood clots that can lead to stroke. If you have the irregular heart rhythm of atrial fibrillation, you should be on a blood thinner unless there is a good reason you cannot take them.  Understand all your medicine instructions.  Make sure that other other conditions (such as anemia or atherosclerosis) are addressed. SEEK IMMEDIATE MEDICAL CARE IF:   You have sudden weakness or numbness of the face, arm, or leg, especially on one side of the body.  Your face or eyelid droops to one side.  You have sudden confusion.  You have trouble speaking (aphasia) or understanding.  You have sudden trouble seeing in one or both eyes.  You have sudden trouble walking.  You have dizziness.  You have a loss of balance or coordination.  You have a sudden, severe headache with no known cause.  You have new chest pain or an irregular heartbeat. Any of these symptoms may represent a serious problem that is an emergency. Do not wait to see if the symptoms will go away. Get medical help at once. Call your local emergency services  (911 in [place] Do not drive yourself to the hospital. Document Released: 10/02/2004 Document Revised: 06/15/2013 Document Reviewed: 02/25/2013 [employer] Patient Information 2015 [employer]. This information is not intended to replace advice given to you by your health care provider. Make sure you discuss any questions you have with your health   care provider.   Smoking Cessation Quitting smoking is important to your health and has many advantages. However, it is not always easy to quit since nicotine is a very addictive drug. Often times, people try 3 times or more before being able to quit. This document explains the best ways  for you to prepare to quit smoking. Quitting takes hard work and a lot of effort, but you can do it. ADVANTAGES OF QUITTING SMOKING  You will live longer, feel better, and live better.  Your body will feel the impact of quitting smoking almost immediately.  Within 20 minutes, blood pressure decreases. Your pulse returns to its normal level.  After 8 hours, carbon monoxide levels in the blood return to normal. Your oxygen level increases.  After 24 hours, the chance of having a heart attack starts to decrease. Your breath, hair, and body stop smelling like smoke.  After 48 hours, damaged nerve endings begin to recover. Your sense of taste and smell improve.  After 72 hours, the body is virtually free of nicotine. Your bronchial tubes relax and breathing becomes easier.  After 2 to 12 weeks, lungs can hold more air. Exercise becomes easier and circulation improves.  The risk of having a heart attack, stroke, cancer, or lung disease is greatly reduced.  After 1 year, the risk of coronary heart disease is cut in half.  After 5 years, the risk of stroke falls to the same as a nonsmoker.  After 10 years, the risk of lung cancer is cut in half and the risk of other cancers decreases significantly.  After 15 years, the risk of coronary heart disease drops, usually to the level of a nonsmoker.  If you are pregnant, quitting smoking will improve your chances of having a healthy baby.  The people you live with, especially any children, will be healthier.  You will have extra money to spend on things other than cigarettes. QUESTIONS TO THINK ABOUT BEFORE ATTEMPTING TO QUIT You may want to talk about your answers with your caregiver.  Why do you want to quit?  If you tried to quit in the past, what helped and what did not?  What will be the most difficult situations for you after you quit? How will you plan to handle them?  Who can help you through the tough times? Your family? Friends?  A caregiver?  What pleasures do you get from smoking? What ways can you still get pleasure if you quit? Here are some questions to ask your caregiver:  How can you help me to be successful at quitting?  What medicine do you think would be best for me and how should I take it?  What should I do if I need more help?  What is smoking withdrawal like? How can I get information on withdrawal? GET READY  Set a quit date.  Change your environment by getting rid of all cigarettes, ashtrays, matches, and lighters in your home, car, or work. Do not let people smoke in your home.  Review your past attempts to quit. Think about what worked and what did not. GET SUPPORT AND ENCOURAGEMENT You have a better chance of being successful if you have help. You can get support in many ways.  Tell your family, friends, and co-workers that you are going to quit and need their support. Ask them not to smoke around you.  Get individual, group, or telephone counseling and support. Programs are available at [employer] and health centers. Call  your local health department for information about programs in your area.  Spiritual beliefs and practices may help some smokers quit.  Download a "quit meter" on your computer to keep track of quit statistics, such as how long you have gone without smoking, cigarettes not smoked, and money saved.  Get a self-help book about quitting smoking and staying off of tobacco. [place] yourself from urges to smoke. Talk to someone, go for a walk, or occupy your time with a task.  Change your normal routine. Take a different route to work. Drink tea instead of coffee. Eat breakfast in a different place.  Reduce your stress. Take a hot bath, exercise, or read a book.  Plan something enjoyable to do every day. Reward yourself for not smoking.  Explore interactive web-based programs that specialize in helping you quit. GET MEDICINE AND  USE IT CORRECTLY Medicines can help you stop smoking and decrease the urge to smoke. Combining medicine with the above behavioral methods and support can greatly increase your chances of successfully quitting smoking.  Nicotine replacement therapy helps deliver nicotine to your body without the negative effects and risks of smoking. Nicotine replacement therapy includes nicotine gum, lozenges, inhalers, nasal sprays, and skin patches. Some may be available over-the-counter and others require a prescription.  Antidepressant medicine helps people abstain from smoking, but how this works is unknown. This medicine is available by prescription.  Nicotinic receptor partial agonist medicine simulates the effect of nicotine in your brain. This medicine is available by prescription. Ask your caregiver for advice about which medicines to use and how to use them based on your health history. Your caregiver will tell you what side effects to look out for if you choose to be on a medicine or therapy. Carefully read the information on the package. Do not use any other product containing nicotine while using a nicotine replacement product.  RELAPSE OR DIFFICULT SITUATIONS Most relapses occur within the first 3 months after quitting. Do not be discouraged if you start smoking again. Remember, most people try several times before finally quitting. You may have symptoms of withdrawal because your body is used to nicotine. You may crave cigarettes, be irritable, feel very hungry, cough often, get headaches, or have difficulty concentrating. The withdrawal symptoms are only temporary. They are strongest when you first quit, but they will go away within 10-14 days. To reduce the chances of relapse, try to:  Avoid drinking alcohol. Drinking lowers your chances of successfully quitting.  Reduce the amount of caffeine you consume. Once you quit smoking, the amount of caffeine in your body increases and can give you symptoms,  such as a rapid heartbeat, sweating, and anxiety.  Avoid smokers because they can make you want to smoke.  Do not let weight gain distract you. Many smokers will gain weight when they quit, usually less than 10 pounds. Eat a healthy diet and stay active. You can always lose the weight gained after you quit.  Find ways to improve your mood other than smoking. FOR MORE INFORMATION  www.smokefree.gov  Document Released: 08/19/2001 Document Revised: 02/24/2012 Document Reviewed: 12/04/2011 [facility] Patient Information 2015 [place], [place]. This information is not intended to replace advice given to you by your health care provider. Make sure you discuss any questions you have with your health care provider.

## 2014-03-09 ENCOUNTER — Ambulatory Visit (HOSPITAL_COMMUNITY): Payer: Medicare Other | Attending: Cardiology | Admitting: Radiology

## 2014-03-09 VITALS — BP 94/71 | HR 51 | Ht 68.0 in | Wt 134.0 lb

## 2014-03-09 DIAGNOSIS — R079 Chest pain, unspecified: Secondary | ICD-10-CM | POA: Insufficient documentation

## 2014-03-09 DIAGNOSIS — I10 Essential (primary) hypertension: Secondary | ICD-10-CM | POA: Insufficient documentation

## 2014-03-09 DIAGNOSIS — F17200 Nicotine dependence, unspecified, uncomplicated: Secondary | ICD-10-CM | POA: Insufficient documentation

## 2014-03-09 DIAGNOSIS — Z9861 Coronary angioplasty status: Secondary | ICD-10-CM | POA: Insufficient documentation

## 2014-03-09 DIAGNOSIS — I2510 Atherosclerotic heart disease of native coronary artery without angina pectoris: Secondary | ICD-10-CM | POA: Insufficient documentation

## 2014-03-09 DIAGNOSIS — I252 Old myocardial infarction: Secondary | ICD-10-CM | POA: Insufficient documentation

## 2014-03-09 DIAGNOSIS — I6529 Occlusion and stenosis of unspecified carotid artery: Secondary | ICD-10-CM | POA: Insufficient documentation

## 2014-03-09 MED ORDER — TECHNETIUM TC 99M SESTAMIBI GENERIC - CARDIOLITE
33.0000 | Freq: Once | INTRAVENOUS | Status: AC | PRN
  Administered 2014-03-09: 33 via INTRAVENOUS

## 2014-03-09 MED ORDER — REGADENOSON 0.4 MG/5ML IV SOLN
0.4000 mg | Freq: Once | INTRAVENOUS | Status: AC
  Administered 2014-03-09: 0.4 mg via INTRAVENOUS

## 2014-03-09 MED ORDER — TECHNETIUM TC 99M SESTAMIBI GENERIC - CARDIOLITE
11.0000 | Freq: Once | INTRAVENOUS | Status: AC | PRN
  Administered 2014-03-09: 11 via INTRAVENOUS

## 2014-03-09 NOTE — Progress Notes (Addendum)
Aaron Velez [address] [place], [place] [address] [phone]    Cardiology Nuclear Med Study  Aaron Velez is a 53 y.o. male     MRN : [id number]     DOB: 12/23/1960  Procedure Date: 03/09/2014  Nuclear Med Background Indication for Stress Test:  Evaluation for Ischemia, Stent Patency and [facility] 6/15 CP History:  CAD, MI, Cath, Stent (RCA, Cfx) Cardiac Risk Factors: Carotid Disease, Hypertension, Lipids and Smoker  Symptoms:  Chest Pain   Nuclear Pre-Procedure Caffeine/Decaff Intake:  None NPO After: 3 pm   Lungs:  clear O2 Sat: 98% on room air. IV 0.9% NS with Angio Cath:  22g  IV Site: R Hand  IV Started by:  Crissie Figures, RN  Chest Size (in):  42 Cup Size: n/a  Height: 5\' 8"  (1.727 m)  Weight:  134 lb (60.782 kg)  BMI:  Body mass index is 20.38 kg/(m^2). Tech Comments:  Coreg held x 24 hrs    Nuclear Med Study 1 or 2 day study: 1 day  Stress Test Type:  Treadmill/Lexiscan  Reading MD: N/A  Order Authorizing Provider:  Loralie Champagne, MD  Resting Radionuclide: Technetium 45m Sestamibi  Resting Radionuclide Dose: 11.0 mCi   Stress Radionuclide:  Technetium 56m Sestamibi  Stress Radionuclide Dose: 33.0 mCi           Stress Protocol Rest HR: 51 Stress HR: 105  Rest BP: 94/71 Stress BP: 107/50  Exercise Time (min): n/a METS: n/a           Dose of Adenosine (mg):  n/a Dose of Lexiscan: 0.4 mg  Dose of Atropine (mg): n/a Dose of Dobutamine: n/a mcg/kg/min (at max HR)  Stress Test Technologist: Glade Lloyd, BS-ES  Nuclear Technologist:  Charlton Amor, CNMT     Rest Procedure:  Myocardial perfusion imaging was performed at rest 45 minutes following the intravenous administration of Technetium 56m Sestamibi. Rest ECG: Sinus rhythm, LVH, no other changes  Stress Procedure:  The patient received IV Lexiscan 0.4 mg over 15-seconds with concurrent low level exercise and then Technetium 3m Sestamibi was injected at  30-seconds while the patient continued walking one more minute.  Quantitative spect images were obtained after a 45-minute delay.  Attempted to walk patient on Bruce Protocol.  His hips began to give him pain and had a significant way to go to achieve THR. Switched to [place].  During the infusion of Lexiscan the patient complained of SOB, stomach ache and nausea.  These symptoms began to resolve in recovery.  Stress ECG: No significant change from baseline ECG  QPS Raw Data Images:  Mild diaphragmatic attenuation; normal left ventricular size. Stress Images:  There is mild decreased uptake is seen at both rest and stress along the inferior wall distribution compatible with diaphragmatic attenuation. Rest Images:  As above, otherwise homogeneous radiotracer uptake Subtraction (SDS):  No evidence of ischemia. Transient Ischemic Dilatation (Normal <1.22):  0.96 Lung/Heart Ratio (Normal <0.45):  0.23  Quantitative Gated Spect Images QGS EDV:  97 ml QGS ESV:  39 ml  Impression Exercise Capacity:  Lexiscan with low level exercise. BP Response:  Normal blood pressure response. Clinical Symptoms:  Typical symptoms with pharmacologic agent ECG Impression:  No significant ST segment change suggestive of ischemia. Comparison with Prior Nuclear Study: No images to compare  Overall Impression:  Low risk stress nuclear study with no areas of ischemia identified.  LV Ejection Fraction: 60%.  LV  Wall Motion:  NL LV Function; NL Wall Motion  SKAINS, MARK, MD  Low risk study with no ischemia.  Please tell patient.   Loralie Champagne 03/10/2014 9:22 PM

## 2014-03-13 NOTE — Progress Notes (Signed)
LEFT MESSAGE  TO CALL BACK  FOR MYOVIEW RESULTS./CY

## 2014-03-14 ENCOUNTER — Telehealth: Payer: Self-pay

## 2014-03-14 ENCOUNTER — Other Ambulatory Visit: Payer: Self-pay | Admitting: Internal Medicine

## 2014-03-14 ENCOUNTER — Other Ambulatory Visit: Payer: Self-pay | Admitting: Cardiology

## 2014-03-14 MED ORDER — DIAZEPAM 5 MG PO TABS: 5.0000 mg | ORAL_TABLET | Freq: Two times a day (BID) | ORAL | Status: DC | PRN

## 2014-03-14 NOTE — Telephone Encounter (Signed)
Ok RF #30, will need UDS @ time of RTC 04-2014

## 2014-03-14 NOTE — Telephone Encounter (Signed)
Refill request for Diazepam.  Patient last seen 02/23/14 and filled 02/20/14 #15  No UDS or contract on file.   Please advise    KP

## 2014-03-14 NOTE — Telephone Encounter (Signed)
Rx faxed.    KP 

## 2014-03-16 NOTE — Progress Notes (Signed)
Pt notified ./cy 

## 2014-03-27 ENCOUNTER — Other Ambulatory Visit: Payer: Self-pay | Admitting: Internal Medicine

## 2014-03-27 ENCOUNTER — Telehealth: Payer: Self-pay | Admitting: *Deleted

## 2014-03-27 NOTE — Telephone Encounter (Signed)
Pt states was given a starter kit for the chantix and was given only 14 sample pills.

## 2014-03-27 NOTE — Telephone Encounter (Signed)
rx refill - chantix 0.5mg  Last OV- 02/23/14 Last refilled - never prescribed okay to fill ?

## 2014-03-27 NOTE — Telephone Encounter (Signed)
Please call the patient, I know He had some Chantix.  Please ask if he has been taking it or not. Supposedly you can take it for 3 months and then stop

## 2014-03-28 MED ORDER — VARENICLINE TARTRATE 0.5 MG X 11 & 1 MG X 42 PO MISC: ORAL | Status: DC

## 2014-03-28 MED ORDER — VARENICLINE TARTRATE 1 MG PO TABS: 1.0000 mg | ORAL_TABLET | Freq: Two times a day (BID) | ORAL | Status: DC

## 2014-03-28 NOTE — Telephone Encounter (Signed)
Pt notified. rx faxed to [facility]

## 2014-03-28 NOTE — Telephone Encounter (Signed)
Ok , will start again, Advise patient: To take a started pack first month Then to take one tablet twice a day for months  #2 or #3 rx sent

## 2014-04-11 ENCOUNTER — Encounter (HOSPITAL_COMMUNITY): Payer: Self-pay | Admitting: Emergency Medicine

## 2014-04-11 ENCOUNTER — Emergency Department (HOSPITAL_COMMUNITY)
Admission: EM | Admit: 2014-04-11 | Discharge: 2014-04-11 | Disposition: A | Discharge status: Home / Self Care | Payer: Medicare Other | Attending: Emergency Medicine | Admitting: Emergency Medicine

## 2014-04-11 DIAGNOSIS — I2510 Atherosclerotic heart disease of native coronary artery without angina pectoris: Secondary | ICD-10-CM | POA: Insufficient documentation

## 2014-04-11 DIAGNOSIS — K089 Disorder of teeth and supporting structures, unspecified: Secondary | ICD-10-CM | POA: Diagnosis not present

## 2014-04-11 DIAGNOSIS — F17200 Nicotine dependence, unspecified, uncomplicated: Secondary | ICD-10-CM | POA: Diagnosis not present

## 2014-04-11 DIAGNOSIS — I10 Essential (primary) hypertension: Secondary | ICD-10-CM | POA: Diagnosis not present

## 2014-04-11 DIAGNOSIS — Z7982 Long term (current) use of aspirin: Secondary | ICD-10-CM | POA: Insufficient documentation

## 2014-04-11 DIAGNOSIS — I252 Old myocardial infarction: Secondary | ICD-10-CM | POA: Insufficient documentation

## 2014-04-11 DIAGNOSIS — Z792 Long term (current) use of antibiotics: Secondary | ICD-10-CM | POA: Diagnosis not present

## 2014-04-11 DIAGNOSIS — K029 Dental caries, unspecified: Secondary | ICD-10-CM | POA: Insufficient documentation

## 2014-04-11 DIAGNOSIS — Z79899 Other long term (current) drug therapy: Secondary | ICD-10-CM | POA: Insufficient documentation

## 2014-04-11 DIAGNOSIS — E785 Hyperlipidemia, unspecified: Secondary | ICD-10-CM | POA: Diagnosis not present

## 2014-04-11 DIAGNOSIS — R110 Nausea: Secondary | ICD-10-CM | POA: Insufficient documentation

## 2014-04-11 DIAGNOSIS — Z7902 Long term (current) use of antithrombotics/antiplatelets: Secondary | ICD-10-CM | POA: Insufficient documentation

## 2014-04-11 DIAGNOSIS — Z880 Allergy status to penicillin: Secondary | ICD-10-CM | POA: Diagnosis not present

## 2014-04-11 DIAGNOSIS — Z9861 Coronary angioplasty status: Secondary | ICD-10-CM | POA: Diagnosis not present

## 2014-04-11 MED ORDER — CLINDAMYCIN HCL 150 MG PO CAPS: 150.0000 mg | ORAL_CAPSULE | Freq: Four times a day (QID) | ORAL | Status: DC

## 2014-04-11 MED ORDER — OXYCODONE-ACETAMINOPHEN 5-325 MG PO TABS: 1.0000 | ORAL_TABLET | Freq: Four times a day (QID) | ORAL | Status: DC | PRN

## 2014-04-11 NOTE — ED Notes (Signed)
Pt report increasing pain in r/lower jaw. Pt stated that he  has 5 teeth that need to be removed

## 2014-04-11 NOTE — Discharge Instructions (Signed)
Follow up with your previously established dentist and oral surgeon for further evaluation and treatment of dental pain.    Dental Extraction A dental extraction procedure refers to a routine tooth extraction performed by your dentist. The procedure depends on where and how the tooth is positioned. The procedure can be very quick, sometimes lasting only seconds. Reasons for dental extraction include:  Tooth decay.  Infections (abscesses).  The need to make room for other teeth.  Gum diseases where the supporting bone has been destroyed.  Fractures of the tooth leaving it unrestorable.  Extra teeth (supernumerary) or grossly malformed teeth.  Baby teeththat have not fallen out in time and have not permitted the the permanent teeth to erupt properly.  In preparation for braces where there is not enough room to align the teeth properly.  Not enough room for wisdom teeth (particularly those that are impacted).  Prior to receiving radiation to the head and neck,teeth in the field of radiation may need to be extracted. LET YOUR CAREGIVER KNOW ABOUT:  Any allergies.  All medicines you are taking:  Including herbs, eye drops, over-the-counter medications, and creams.  Blood thinners (anticoagulants), aspirin, or other drugs that may affect blood clotting.  Use of steroids (through mouth or as creams).  Previous problems with anesthetics, including local anesthetics.  History of bleeding or blood problems.  Previous surgery.  Possibility of pregnancy if this applies.  Smoking history.  Any health problems. RISKS AND COMPLICATIONS As with any procedure, complications may occur, but they can usually be managed by your caregiver. General surgical complications may include:  Reaction to anesthesia.  Damage to surrounding teeth, nerves, tissues, or structures.  Infection.  Bleeding. With appropriate treatment and care after surgery, the following complications are very  uncommon:  Dry socket (blood clot does not form or stay in place over empty socket). This can delay healing.  Incomplete extraction of roots.  Jawbone injury, pain, or weakness. BEFORE THE PROCEDURE  Your dental care provider will:  Take a medical and dental history.  Take an X-ray to evaluate the circumstances and how to best extract the tooth.  Do an oral exam.  Depending on the situation, antibiotics may be given before or after the extraction.  Your caregivers may review the procedure, the local anesthesia and/or sedation being used, and what to expect after the procedure with you.  If needed, your dentist may give you a form of sedation, either by medicine you swallow, gas, or intravenously (IV). This will help to relieve anxiety. Complicated extractions may require the use of general anesthesia. It is important to follow your caregiver's instructions prior to your procedure to avoid complications. Steps before your procedure may include:  Alert your caregiver if you feel ill (sore throat, fever, upset stomach, etc.) in the days leading up to your procedure.  Stop taking certain medications for several days prior to your procedure such as blood thinners.  Take certain medications, such as antibiotics.  Avoid eating and drinking for several hours before the procedure. This will help you to avoid complications from the sedation or anesthesia.  Sign a patient consent form.  Have a friend or family member drive you to the dentist and drive you home after the procedure.  Wear comfortable, loose clothing. Limit makeup and jewelry.  Quit smoking. If you are a smoker, this will raise the chances of a healing problem after your procedure. If you are thinking about quitting, talk to your surgeon about how long before  the operation you should stop smoking. You may also get help from your primary caregiver. PROCEDURE Dental extraction is typically done as an outpatient procedure.  IV sedation, local anesthesia, or both may be used. It will keep you comfortable and free of pain during the procedure.  There are 2 types of extractions:  Simple extraction involves a tooth that is visible in the mouth and above the gum line. After local anesthetic is given by injection, and the area is numbed, the dentist will loosen the tooth with a special instrument (elevator). Then another instrument (forceps) will be used to grasp the tooth and remove it from its socket. During the procedure you will feel some pressure, but you should not feel pain. If you do feel pain, tell your dentist. The open socket will be cleaned. Dressings (gauze) will be placed in the socket to reduce bleeding.  Surgical extractions are used if the tooth has not come into the mouth or the tooth is broken off below the gum line. The dentist will make a cut (incision) in the gum and may have to remove some of the bone around the tooth to aid in the removal of the tooth. After removal, stitches (sutures) may be required to close the area to help in healing and control bleeding. For some surgical extractions, you may need a general anesthetic or IV sedation (through the vein). After both types of extractions, you may be given pain medication or other drugs to help healing. Other postoperative instructions will be given by your dental caregiver. AFTER THE PROCEDURE  You will have gauze in your mouth where the tooth was removed. Gentle pressure on the gauze for up to 1 hour will help to control bleeding.  A blood clot will begin to form over the open socket. This is normal. Do not touch the area or rinse it.  Your pain will be controlled with medication and self-care.  You will be given detailed instructions for care after surgery. PROGNOSIS While some discomfort is normal after tooth extraction, most patients recover fully in just a few days. SEEK IMMEDIATE DENTAL CARE  You have uncontrolled bleeding, marked swelling,  or severe pain.  You develop a fever, difficulty swallowing, or other severe symptoms.  You have questions or concerns. Document Released: 08/25/2005 Document Revised: 01/09/2014 Document Reviewed: 11/29/2010 [facility] Patient Information 2015 [place], [place]. This information is not intended to replace advice given to you by your health care provider. Make sure you discuss any questions you have with your health care provider.

## 2014-04-11 NOTE — ED Provider Notes (Signed)
Medical screening examination/treatment/procedure(s) were performed by non-physician practitioner and as supervising physician I was immediately available for consultation/collaboration.   EKG Interpretation None        Merryl Hacker, MD 04/11/14 [phone]

## 2014-04-11 NOTE — ED Provider Notes (Signed)
CSN: [id number]     Arrival date & time 04/11/14  6962 History   First MD Initiated Contact with Patient 04/11/14 1021     Chief Complaint  Patient presents with  . Dental Pain    recurrrent pain in r/lower jaw     (Consider location/radiation/quality/duration/timing/severity/associated sxs/prior Treatment) HPI Pt is a 53yo male presenting to ED with c/o dental pain that has gradually worsened over last 2 days.  Pt states pain is constant, aching, throbbing, 10/10, worse with eating and drinking.  Pt states he is in the process of getting quotes from 2 oral surgeons for removal of 5 teeth.  States he does not have f/u until the end of the month but states he does a Investment banker, corporate that he has been working with to help with his mouth, Dr. Terence Lux and Dr. Archie Balboa.  Denies fever or vomiting but does report nausea. States he thinks it is from his pain medication as he has tried acetaminophen, ibuprofen, tylenol 3 and norco w/o relief.  Denies difficulty breathing or swallowing. Denies recent trauma to mouth.    Past Medical History  Diagnosis Date  . CAD (coronary artery disease)   . HTN (hypertension)   . HLD (hyperlipidemia)   . Myocardial infarction 2001 & 2008  . Shortness of breath   . Carotid artery occlusion    Past Surgical History  Procedure Laterality Date  . Carotid endarterectomy  12/05/2010    right CEA  . Coronary angioplasty with stent placement  2001,2007,2008  . Finger surgery      For brown recluse spider bite  . Percutaneous placement intravascular stent cervical carotid artery Right 01-2013   Family History  Problem Relation Age of Onset  . Hypertension      family history  . Coronary artery disease      family history  . Cancer - Colon Neg Hx   . Prostate cancer Neg Hx   . Diabetes Neg Hx   . Hyperlipidemia Mother   . Hypertension Mother    History  Substance Use Topics  . Smoking status: Current Every Day Smoker -- 1.00 packs/day for 40 years    Types: Cigarettes  . Smokeless tobacco: Former Systems developer    Types: [place] date: 09/08/1978     Comment: 1 to 1.5 PPD  . Alcohol Use: Yes     Comment: socially     Review of Systems  Constitutional: Negative for fever and chills.  HENT: Positive for dental problem. Negative for sore throat, trouble swallowing and voice change.   Gastrointestinal: Positive for nausea. Negative for vomiting and abdominal pain.  All other systems reviewed and are negative.     Allergies  Penicillins  Home Medications   Prior to Admission medications   Medication Sig Start Date End Date Taking? Authorizing Provider  acetaminophen (TYLENOL) 500 MG tablet Take 1,000 mg by mouth every 6 (six) hours as needed for moderate pain.   Yes Historical Provider, MD  acetaminophen-codeine (TYLENOL #3) 300-30 MG per tablet Take 3.5 tablets by mouth once as needed for moderate pain.   Yes Historical Provider, MD  aspirin 81 MG tablet Take 81 mg by mouth daily.     Yes Historical Provider, MD  atorvastatin (LIPITOR) 40 MG tablet take 1 tablet by mouth once daily   Yes Larey Dresser, MD  carvedilol (COREG) 6.25 MG tablet take 1 tablet by mouth twice a day   Yes Larey Dresser, MD  clopidogrel (PLAVIX) 75 MG tablet take 1 tablet by mouth once daily   Yes Larey Dresser, MD  diazepam (VALIUM) 5 MG tablet Take 1 tablet (5 mg total) by mouth every 12 (twelve) hours as needed for anxiety. 03/14/14  Yes Colon Branch, MD  escitalopram (LEXAPRO) 10 MG tablet Take 1.5 tablets (15 mg total) by mouth daily. 02/23/14  Yes Colon Branch, MD  HYDROcodone-acetaminophen (NORCO/VICODIN) 5-325 MG per tablet Take 1 tablet by mouth every 6 (six) hours as needed for moderate pain.   Yes Historical Provider, MD  nitroGLYCERIN (NITROSTAT) 0.4 MG SL tablet Place 1 tablet (0.4 mg total) under the tongue every 5 (five) minutes as needed for chest pain. 09/12/13  Yes Larey Dresser, MD  ramipril (ALTACE) 2.5 MG capsule Take 2.5 mg by mouth daily.    Yes Historical Provider, MD  varenicline (CHANTIX CONTINUING MONTH PAK) 1 MG tablet Take 1 tablet (1 mg total) by mouth 2 (two) times daily. 03/28/14  Yes Colon Branch, MD  clindamycin (CLEOCIN) 150 MG capsule Take 1 capsule (150 mg total) by mouth every 6 (six) hours. 04/11/14   Noland Fordyce, PA-C  oxyCODONE-acetaminophen (PERCOCET/ROXICET) 5-325 MG per tablet Take 1-2 tablets by mouth every 6 (six) hours as needed for moderate pain or severe pain. 04/11/14   Noland Fordyce, PA-C   BP 140/77  Pulse 63  Temp(Src) 98.7 F (37.1 C) (Oral)  Resp 18  SpO2 98% Physical Exam  Nursing note and vitals reviewed. Constitutional: He is oriented to person, place, and time. He appears well-developed and well-nourished.  Appears well, non-toxic.  HENT:  Head: Normocephalic and atraumatic.  Mouth/Throat: Uvula is midline, oropharynx is clear and moist and mucous membranes are normal. Abnormal dentition. Dental caries present. No dental abscesses.  Dental decay of front lower teeth, with partial dentures in place. No gingival abscess present.  No discharge or bleeding. No airway involvement.   Eyes: EOM are normal.  Neck: Normal range of motion.  Cardiovascular: Normal rate.   Pulmonary/Chest: Effort normal.  Musculoskeletal: Normal range of motion.  Neurological: He is alert and oriented to person, place, and time.  Skin: Skin is warm and dry.  Psychiatric: He has a normal mood and affect. His behavior is normal.    ED Course  Procedures (including critical care time) Labs Review Labs Reviewed - No data to display  Imaging Review No results found.   EKG Interpretation None      MDM   Final diagnoses:  Dental decay  Pain due to dental caries    Pt is a 53yo male presenting to ED with c/o dental pain.  Pt does have dental decay. Pt in process of getting estimates from dentists and oral surgeons for extraction of 5 teeth but cannot f/u until end of the month.  [place] Controlled substance database  reviewed.  Pain medication prescriptions consistent with pt's reported hx.  On exam, dental decay present w/o obvious gingival abscess requiring I&D.  Will tx with clindamycin and percocet. Encouraged to continue f/u as planned with Dr. Terence Lux and Dr. Archie Balboa.  Also provided pt info for Dr. Radford Pax, DDS, who is on call for ED today, in case pt needs additional resources. Return precautions provided. Pt verbalized understanding and agreement with tx plan.     Noland Fordyce, PA-C 04/11/14 1050

## 2014-04-12 ENCOUNTER — Telehealth: Payer: Self-pay | Admitting: *Deleted

## 2014-04-12 NOTE — Telephone Encounter (Signed)
Received Notice of denial and appeal letter via fax from [employer] for Chantix starting month box.  Approval letter filled out as much as possible,and placed in folder for Dr. Larose Kells to complete.//AB/CMA

## 2014-04-12 NOTE — Telephone Encounter (Signed)
Received prior authorization request via fax from [place] for Chantix Starting month box.  Printed [employer] form on-line and filled out and faxed.   Awaiting response.//AB/CMA

## 2014-04-14 NOTE — Telephone Encounter (Signed)
Received completed and signed appeal form for medication denial for Chantix starting month pak.  All forms faxed to [employer] at [phone]).  Confirmation received.  Awaiting response.//AB/CMA

## 2014-04-17 ENCOUNTER — Other Ambulatory Visit: Payer: Self-pay | Admitting: Cardiology

## 2014-04-17 NOTE — Telephone Encounter (Signed)
[employer] [facility] called back - wanted to see if we got fax.  Will resend

## 2014-04-25 ENCOUNTER — Ambulatory Visit (INDEPENDENT_AMBULATORY_CARE_PROVIDER_SITE_OTHER): Payer: Medicare Other | Admitting: Internal Medicine

## 2014-04-25 ENCOUNTER — Encounter: Payer: Self-pay | Admitting: Internal Medicine

## 2014-04-25 VITALS — BP 103/66 | HR 66 | Temp 98.4°F | Wt 137.5 lb

## 2014-04-25 DIAGNOSIS — I2510 Atherosclerotic heart disease of native coronary artery without angina pectoris: Secondary | ICD-10-CM

## 2014-04-25 DIAGNOSIS — F411 Generalized anxiety disorder: Secondary | ICD-10-CM

## 2014-04-25 DIAGNOSIS — F419 Anxiety disorder, unspecified: Secondary | ICD-10-CM

## 2014-04-25 DIAGNOSIS — I6529 Occlusion and stenosis of unspecified carotid artery: Secondary | ICD-10-CM

## 2014-04-25 DIAGNOSIS — F17200 Nicotine dependence, unspecified, uncomplicated: Secondary | ICD-10-CM

## 2014-04-25 DIAGNOSIS — J069 Acute upper respiratory infection, unspecified: Secondary | ICD-10-CM

## 2014-04-25 MED ORDER — ESCITALOPRAM OXALATE 10 MG PO TABS: 15.0000 mg | ORAL_TABLET | Freq: Every day | ORAL | Status: DC

## 2014-04-25 NOTE — Assessment & Plan Note (Addendum)
After extensive paper work and a number of phone call  the patient has been told they approved  Chantix, will call the pharmacy today and make sure he gets it

## 2014-04-25 NOTE — Progress Notes (Signed)
Pre-visit discussion using our clinic review tool. No additional management support is needed unless otherwise documented below in the visit note.  

## 2014-04-25 NOTE — Assessment & Plan Note (Signed)
Last visit with vascular surgery last month, they recommend maximal medical therapy, main goal in the next months is stopping tobacco

## 2014-04-25 NOTE — Assessment & Plan Note (Signed)
At the last visit we increased Lexapro, it worked, refill provided today. Also take benzodiazepines sporadically, we'll get a UDS.

## 2014-04-25 NOTE — Patient Instructions (Addendum)
Please go to the lab for a urine test (UDS)  Rest, fluids , tylenol For cough, take Mucinex DM twice a day as needed  For congestion use OTC Nasocort: 2 nasal sprays on each side of the nose daily until you feel better  Call if no better in few days Call anytime if the symptoms are severe, you have high fever, short of breath, chest pain  Next visit 6 months

## 2014-04-25 NOTE — Telephone Encounter (Signed)
Received Approval letter for the Chantix starting month pak from [employer].   Information faxed to [facility].  Approval letter send for scanning.//AB/CMA

## 2014-04-25 NOTE — Progress Notes (Signed)
Subjective:    Patient ID: Aaron Velez, male    DOB: [birth year], 53 y.o.   MRN: [id number]  DOS:  04/25/2014 Type of visit - description: f/u History: Since the last time he was here, saw vascular surgery, he was felt to be doing well , prescribed medical management Went to the ER 04/11/2014, diagnosed with a dental infection, doing better. Anxiety, at the last visit Lexapro was increased and it has help significantly, was unable to get a prescription in a timely manner, needs a prescription today. Also 3 days ago developed a cold: Sore throat, the next day he developed cough, runny nose, watery eyes. Taking OTC medications with some help. Tobacco abuse, so far he has been unable to get Chantix however he was told it was approved by his insurance  CAD, low risk stress test 03/10/2014  ROS Denies fever, chills or myalgias. No nausea, vomiting, diarrhea. Did have some sputum production, with the onset of URI symptoms, cough is dry today.  Past Medical History  Diagnosis Date  . CAD (coronary artery disease)   . HTN (hypertension)   . HLD (hyperlipidemia)   . Myocardial infarction 2001 & 2008  . Shortness of breath   . Carotid artery occlusion     Past Surgical History  Procedure Laterality Date  . Carotid endarterectomy  12/05/2010    right CEA  . Coronary angioplasty with stent placement  2001,2007,2008  . Finger surgery      For brown recluse spider bite  . Percutaneous placement intravascular stent cervical carotid artery Right 01-2013    History   Social History  . Marital Status: Married    Spouse Name: N/A    Number of Children: 4  . Years of Education: N/A   Occupational History  . retired     Social History Main Topics  . Smoking status: Current Every Day Smoker -- 1.00 packs/day for 40 years    Types: Cigarettes  . Smokeless tobacco: Former Systems developer    Types: [place] date: 09/08/1978     Comment: 1 to 1.5 PPD  . Alcohol Use: Yes     Comment:  socially   . Drug Use: No  . Sexual Activity: Not on file   Other Topics Concern  . Not on file   Social History Narrative   HS Diploma.   Household-- wife, 2 of their children with  their families         Medication List       This list is accurate as of: 04/25/14  6:26 PM.  Always use your most recent med list.               aspirin 81 MG tablet  Take 81 mg by mouth daily.     atorvastatin 40 MG tablet  Commonly known as:  LIPITOR  take 1 tablet by mouth once daily     carvedilol 6.25 MG tablet  Commonly known as:  COREG  take 1 tablet by mouth twice a day     clopidogrel 75 MG tablet  Commonly known as:  PLAVIX  take 1 tablet by mouth once daily     diazepam 5 MG tablet  Commonly known as:  VALIUM  Take 1 tablet (5 mg total) by mouth every 12 (twelve) hours as needed for anxiety.     escitalopram 10 MG tablet  Commonly known as:  LEXAPRO  Take 1.5 tablets (15 mg total) by mouth daily.  nitroGLYCERIN 0.4 MG SL tablet  Commonly known as:  NITROSTAT  Place 1 tablet (0.4 mg total) under the tongue every 5 (five) minutes as needed for chest pain.     ramipril 2.5 MG capsule  Commonly known as:  ALTACE  take 1 capsule by mouth once daily     varenicline 1 MG tablet  Commonly known as:  CHANTIX CONTINUING MONTH PAK  Take 1 tablet (1 mg total) by mouth 2 (two) times daily.           Objective:   Physical Exam BP 103/66  Pulse 66  Temp(Src) 98.4 F (36.9 C) (Oral)  Wt 137 lb 8 oz (62.37 kg)  SpO2 98% General -- alert, well-developed, NAD.  HEENT-- Not pale. TMs normal, throat symmetric, no redness or discharge. Face symmetric, sinuses not tender to palpation. Nose slt congested. Lungs -- normal respiratory effort, no intercostal retractions, no accessory muscle use, and decreased breath sounds.  Heart-- normal rate, regular rhythm, no murmur. Extremities-- no pretibial edema bilaterally  Neurologic--  alert & oriented X3. Speech normal, gait  appropriate for age, strength symmetric and appropriate for age.   Psych-- Cognition and judgment appear intact. Cooperative with normal attention span and concentration. No anxious or depressed appearing.      Assessment & Plan:   Today , I spent more than  25  min with the patient: assessing a new problem (URI) , reviewing the chart and labs ordered by other providers    URI, See instructions  Dental infection, improving per patient

## 2014-05-09 ENCOUNTER — Telehealth: Payer: Self-pay

## 2014-05-09 NOTE — Telephone Encounter (Signed)
UDS: 04/25/2014  Negative for Valium.  Low risk per Dr. Larose Kells 05/09/2014

## 2014-05-27 ENCOUNTER — Other Ambulatory Visit: Payer: Self-pay | Admitting: Internal Medicine

## 2014-05-29 ENCOUNTER — Telehealth: Payer: Self-pay

## 2014-05-29 MED ORDER — DIAZEPAM 5 MG PO TABS: 5.0000 mg | ORAL_TABLET | Freq: Two times a day (BID) | ORAL | Status: DC | PRN

## 2014-05-29 NOTE — Telephone Encounter (Signed)
Medication faxed to [facility].

## 2014-05-29 NOTE — Telephone Encounter (Signed)
Pt is requesting refill on Diazepam.  Last OV: 04/25/2014 Last Fill: 03/24/2014 # 30 with 0 RF UDS: 04/25/2014: Low Risk   Please advise.

## 2014-06-26 ENCOUNTER — Telehealth: Payer: Self-pay | Admitting: Cardiology

## 2014-06-26 NOTE — Telephone Encounter (Signed)
Pt advised,verbalized understanding. 

## 2014-06-26 NOTE — Telephone Encounter (Signed)
Pt asking if he should hold Plavix or aspirin prior to extractions.  Pt states dentist did not have any recommendations about Plavix or aspirin. Pt asking if there are any other recommendations prior to extractions. I will forward to Dr Aundra Dubin for review.

## 2014-06-26 NOTE — Telephone Encounter (Signed)
New message     Pt is have 4 teeth pulled on Wednesday.  Should he stop his plavix, aspirin and any other heart medications?

## 2014-06-26 NOTE — Telephone Encounter (Signed)
If his dentist is comfortable with him continuing Plavix, would continue it.

## 2014-06-26 NOTE — Telephone Encounter (Signed)
Pt states he is scheduled to have 4 teeth extracted on Wednesday.

## 2014-06-28 ENCOUNTER — Other Ambulatory Visit: Payer: Self-pay | Admitting: Cardiology

## 2014-06-29 ENCOUNTER — Telehealth: Payer: Self-pay | Admitting: Cardiology

## 2014-06-29 NOTE — Telephone Encounter (Signed)
New message  Aaron Velez per Dr. Kathie Rhodes office-- oral surgeon in [place] [phone] called. Reports the pt will have teeth removed under sedation. The patient is on blood thinner and nitro. Dr. Weston Settle is requesting a call back for clearance and to discuss if the patient can come off of the blood thinner and will the sedation effect his angina//Please call back to discuss

## 2014-06-29 NOTE — Telephone Encounter (Signed)
Aaron Velez from Dr. Kathie Rhodes oral surgeon called; She states pt is scheduled to have five teeth extraction on 07/20/14 using conscious sedation ( use Propofol mixed with other medication?)   Dentist is requesting  clearace  for sedation taking in consideration of pt having angina pain, and if pt can come  off the blood thinner (Plavix and Aspirin). Dentist would like to have recommendations from cardiologist.

## 2014-06-30 NOTE — Telephone Encounter (Signed)
He can have the sedation. I would rather him stay on his antiplatelet agents if possible as he has history of stent thrombosis.

## 2014-06-30 NOTE — Telephone Encounter (Signed)
Faxed this note to Dr. Weston Settle' office.

## 2014-08-08 ENCOUNTER — Other Ambulatory Visit: Payer: Self-pay

## 2014-08-10 ENCOUNTER — Ambulatory Visit: Payer: Medicare Other | Admitting: Internal Medicine

## 2014-08-17 ENCOUNTER — Encounter (HOSPITAL_COMMUNITY): Payer: Self-pay | Admitting: Surgery

## 2014-08-25 ENCOUNTER — Encounter: Payer: Self-pay | Admitting: *Deleted

## 2014-08-25 ENCOUNTER — Ambulatory Visit (INDEPENDENT_AMBULATORY_CARE_PROVIDER_SITE_OTHER): Payer: Medicare Other | Admitting: Cardiology

## 2014-08-25 ENCOUNTER — Encounter: Payer: Self-pay | Admitting: Cardiology

## 2014-08-25 VITALS — BP 90/50 | HR 61 | Ht 69.0 in | Wt 135.0 lb

## 2014-08-25 DIAGNOSIS — R0989 Other specified symptoms and signs involving the circulatory and respiratory systems: Secondary | ICD-10-CM

## 2014-08-25 DIAGNOSIS — F17200 Nicotine dependence, unspecified, uncomplicated: Secondary | ICD-10-CM

## 2014-08-25 DIAGNOSIS — Z720 Tobacco use: Secondary | ICD-10-CM

## 2014-08-25 DIAGNOSIS — I2510 Atherosclerotic heart disease of native coronary artery without angina pectoris: Secondary | ICD-10-CM

## 2014-08-25 DIAGNOSIS — I739 Peripheral vascular disease, unspecified: Secondary | ICD-10-CM

## 2014-08-25 DIAGNOSIS — I6529 Occlusion and stenosis of unspecified carotid artery: Secondary | ICD-10-CM

## 2014-08-25 NOTE — Patient Instructions (Signed)
Your physician has requested that you have a lower extremity arterial duplex. This test is an ultrasound of the arteries in the legs . It looks at arterial blood flow in the legs. Allow one hour for Lower  Arterial scans. There are no restrictions or special instructions  Your physician wants you to follow-up in: 6 months with Dr Aundra Dubin. (June 2016) You will receive a reminder letter in the mail two months in advance. If you don't receive a letter, please call our office to schedule the follow-up appointment.   Your physician recommends that you return for a FASTING lipid profile: in 6 months when you see Dr Aundra Dubin.

## 2014-08-27 DIAGNOSIS — I739 Peripheral vascular disease, unspecified: Secondary | ICD-10-CM

## 2014-08-27 HISTORY — DX: Peripheral vascular disease, unspecified: I73.9

## 2014-08-27 NOTE — Progress Notes (Signed)
Patient ID: Aaron Velez, male   DOB: [birth year], 53 y.o.   MRN: [id number] PCP: Dr. Larose Kells  53 yo with history of CAD and smoking presents for cardiology followup.  He had BMS to the RCA in 2001, BMS to the CFX in 2007, then had stent thrombosis in the RCA stent in 5/08 with inferior STEMI. No exertional dyspnea.  He walks his dog daily.  He continues to smoke 1 ppd, failed Chantix recently.  No claudication symptoms, no stroke-like symptoms.  In 5/14, he had a right carotid stent placed.  Lexiscan Cardiolite in 7/15 showed no ischemia.   Currently stable.  No chest pain.  No exertional dyspnea, claudication, or lightheadedness.     Labs (10/12): creatinine 0.73 Labs (3/13): K 4.2, creatinine 0.9, LDL 115, HDL 37 Labs (3/14): LDL 66, HDL 25, LFTs normal Labs (6/15): K 4.3, creatinine 0.71, TSH normal, LDL 79, HDL 36  PMH: 1. CAD: BMS to RCA in 2001.  Unstable angina with BMS to CFX in 8/07.  Inferior MI in 5/08 due to stent thrombosis in RCA, had PTCA.  EF 60% with inferior hypokinesis on 5/08 cath. Lexiscan Cardiolite (7/15) with EF 60%, no ischemia, +diaphragmatic attenuation.  2. Erb's palsy 3. HTN 4. Anxiety 5. Carotid stenosis: right CEA in 3/12.  Carotid dopplers (4/13) with 40-59% RICA stenosis.  Right carotid stent placed in 5/14.  12/14 carotid dopplers with < 63% LICA stenosis and patent RICA stent. Carotid dopplers (8/75) with < 64% LICA stenosis.  6. Hyperlipidemia 7. Active smoker: Failed Chantix and Wellbutrin.  8. Rotator cuff strain/tear   SH: Married, lives in [place].  Smokes 1 ppd.  On disability.   FH: CAD  ROS: All systems reviewed and negative except as per HPI.   Current Outpatient Prescriptions  Medication Sig Dispense Refill  . aspirin 81 MG tablet Take 81 mg by mouth daily.      Marland Kitchen atorvastatin (LIPITOR) 40 MG tablet take 1 tablet by mouth once daily 30 tablet 2  . carvedilol (COREG) 6.25 MG tablet take 1 tablet by mouth twice a day 60 tablet 3  .  clopidogrel (PLAVIX) 75 MG tablet take 1 tablet by mouth once daily 30 tablet 2  . diazepam (VALIUM) 5 MG tablet Take 1 tablet (5 mg total) by mouth every 12 (twelve) hours as needed for anxiety. 60 tablet 1  . escitalopram (LEXAPRO) 10 MG tablet Take 1.5 tablets (15 mg total) by mouth daily. 60 tablet 6  . nitroGLYCERIN (NITROSTAT) 0.4 MG SL tablet Place 1 tablet (0.4 mg total) under the tongue every 5 (five) minutes as needed for chest pain. 25 tablet 3  . ramipril (ALTACE) 2.5 MG capsule take 1 capsule by mouth once daily 30 capsule 3   No current facility-administered medications for this visit.    BP 90/50 mmHg  Pulse 61  Ht 5\' 9"  (1.753 m)  Wt 135 lb (61.236 kg)  BMI 19.93 kg/m2  SpO2 96% General: NAD, thin Neck: No JVD, no thyromegaly or thyroid nodule.  Lungs: Mildly decreased breath sounds throughout.  CV: Nondisplaced PMI.  Heart regular S1/S2, no S3/S4, 1/6 SEM.  No peripheral edema.  Left carotid bruit.  I cannot feel his right pedal pulses.  Abdomen: Soft, nontender, no hepatosplenomegaly, no distention.  Neurologic: Alert and oriented x 3.  Psych: Normal affect. Extremities: No clubbing or cyanosis.   Assessment/Plan: 1. CAD: No chest pain.  Lexiscan Cardiolite in 7/15 showed no ischemia.   - Continue ASA,  statin, Coreg, ramipril.   - I will have him continue Plavix long-term given history of stent thrombosis.  2. Carotid stenosis: Followed at [facility]. He had right carotid stent placed in 5/14.  3. Hyperlipidemia: Goal LDL < 70.  4. Smoking: Still smoking 1 ppd.  I strongly encouraged him to quit. He is going to try nicotine patches. 5. PAD: Unable to feel right pedal pulses.  I will check peripheral arterial dopplers.    Loralie Champagne 08/27/2014

## 2014-08-28 ENCOUNTER — Other Ambulatory Visit (HOSPITAL_COMMUNITY): Payer: Self-pay | Admitting: Cardiology

## 2014-08-28 ENCOUNTER — Ambulatory Visit (HOSPITAL_COMMUNITY): Payer: Medicare Other | Attending: Cardiology | Admitting: Cardiology

## 2014-08-28 DIAGNOSIS — I739 Peripheral vascular disease, unspecified: Secondary | ICD-10-CM

## 2014-08-28 DIAGNOSIS — I2510 Atherosclerotic heart disease of native coronary artery without angina pectoris: Secondary | ICD-10-CM

## 2014-08-28 DIAGNOSIS — I700 Atherosclerosis of aorta: Secondary | ICD-10-CM | POA: Insufficient documentation

## 2014-08-28 DIAGNOSIS — I10 Essential (primary) hypertension: Secondary | ICD-10-CM | POA: Insufficient documentation

## 2014-08-28 DIAGNOSIS — R0989 Other specified symptoms and signs involving the circulatory and respiratory systems: Secondary | ICD-10-CM | POA: Diagnosis present

## 2014-08-28 DIAGNOSIS — E785 Hyperlipidemia, unspecified: Secondary | ICD-10-CM | POA: Insufficient documentation

## 2014-08-28 DIAGNOSIS — I708 Atherosclerosis of other arteries: Secondary | ICD-10-CM | POA: Insufficient documentation

## 2014-08-28 DIAGNOSIS — Z720 Tobacco use: Secondary | ICD-10-CM | POA: Diagnosis not present

## 2014-08-28 NOTE — Progress Notes (Signed)
LEA Doppler + Duplex performed.

## 2014-08-29 ENCOUNTER — Telehealth: Payer: Self-pay | Admitting: *Deleted

## 2014-08-29 NOTE — Telephone Encounter (Signed)
-----   Message from Larey Dresser, MD sent at 08/29/2014  5:30 PM EST ----- Regarding: FW: prelim [facility], could you please refer to Dr. Trula Slade or another at [facility] to see if anything needs to be done about this? Thanks.  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   Thank you

## 2014-08-29 NOTE — Telephone Encounter (Signed)
Pt advised, verbalized understanding.   Pt states he has an appt already scheduled at [facility] 09/11/14 for carotid follow up. I advised pt that I would ask [facility] to contact Dr Stephens Shire office to see if separate appt should be scheduled for evaluation of AVF right groin.

## 2014-09-04 ENCOUNTER — Encounter: Payer: Self-pay | Admitting: Cardiology

## 2014-09-04 ENCOUNTER — Other Ambulatory Visit: Payer: Self-pay | Admitting: Cardiology

## 2014-09-04 NOTE — Telephone Encounter (Signed)
New message    Patient was told by the nurse to call on Monday .  Patient aware nurse is out today. Message route to triage.

## 2014-09-04 NOTE — Telephone Encounter (Signed)
Informed patient that it does not look like [facility] has contacted Dr. Stephens Shire office yet.  Instructed patient to call back first thing Wednesday morning if he has not heard from them yet.  Patient agrees with treatment plan.

## 2014-09-04 NOTE — Telephone Encounter (Signed)
This encounter was created in error - please disregard.

## 2014-09-05 ENCOUNTER — Encounter: Payer: Self-pay | Admitting: Family

## 2014-09-06 ENCOUNTER — Telehealth: Payer: Self-pay | Admitting: Cardiology

## 2014-09-06 ENCOUNTER — Other Ambulatory Visit: Payer: Self-pay

## 2014-09-06 DIAGNOSIS — I770 Arteriovenous fistula, acquired: Secondary | ICD-10-CM

## 2014-09-06 NOTE — Progress Notes (Signed)
amb ref done for [facility] for his AV fistula.

## 2014-09-06 NOTE — Telephone Encounter (Signed)
New Msg         Please contact Dayna at [facility] at [phone] in regards to pt.

## 2014-09-07 NOTE — Telephone Encounter (Signed)
Phone call from [place] at [facility], and she states Dr. Trula Slade will see patient in Monday01/12/2014

## 2014-09-11 ENCOUNTER — Encounter: Payer: Self-pay | Admitting: Surgery

## 2014-09-11 ENCOUNTER — Ambulatory Visit (INDEPENDENT_AMBULATORY_CARE_PROVIDER_SITE_OTHER): Payer: Medicare Other | Admitting: Surgery

## 2014-09-11 ENCOUNTER — Ambulatory Visit (HOSPITAL_COMMUNITY)
Admission: RE | Admit: 2014-09-11 | Discharge: 2014-09-11 | Disposition: A | Discharge status: Home / Self Care | Payer: Medicare Other | Source: Ambulatory Visit | Attending: Surgery | Admitting: Surgery

## 2014-09-11 VITALS — BP 102/71 | HR 55 | Ht 69.0 in | Wt 135.8 lb

## 2014-09-11 DIAGNOSIS — Z48812 Encounter for surgical aftercare following surgery on the circulatory system: Secondary | ICD-10-CM | POA: Insufficient documentation

## 2014-09-11 DIAGNOSIS — I6523 Occlusion and stenosis of bilateral carotid arteries: Secondary | ICD-10-CM | POA: Insufficient documentation

## 2014-09-11 DIAGNOSIS — I770 Arteriovenous fistula, acquired: Secondary | ICD-10-CM

## 2014-09-11 NOTE — Addendum Note (Signed)
Addended by: Mena Goes on: 09/11/2014 05:16 PM   Modules accepted: Orders

## 2014-09-11 NOTE — Progress Notes (Signed)
Patient name: Aaron Velez MRN: [id number] DOB: 1961/07/13 Sex: male     Chief Complaint  Patient presents with  . Re-evaluation    6 month f/u carotid, eval Rt groin    HISTORY OF PRESENT ILLNESS: The patient is back today for followup. He is status post right carotid stent on 01/18/2013. He has previously undergone right carotid endarterectomy on 12/05/2010. He has no complaints today.  He recently saw Dr.McLean could not feel pulses and therefore ordered lower extremity Doppler studies.  He had an ankle-brachial index of about 0.88.  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  The patient does not report any swelling in his right leg.  He has no symptoms of claudication.  Patient denies any neurologic symptoms.  Specifically no numbness weakness and dysphagia or amaurosis.  Past Medical History  Diagnosis Date  . CAD (coronary artery disease)   . HTN (hypertension)   . HLD (hyperlipidemia)   . Myocardial infarction 2001 & 2008  . Shortness of breath   . Carotid artery occlusion     Past Surgical History  Procedure Laterality Date  . Carotid endarterectomy  12/05/2010    right CEA  . Coronary angioplasty with stent placement  2001,2007,2008  . Finger surgery      For brown recluse spider bite  . Percutaneous placement intravascular stent cervical carotid artery Right 01-2013  . Carotid angiogram Right 07/13/2012    Procedure: CAROTID ANGIOGRAM;  Surgeon: Serafina Mitchell, MD;  Location: [facility] CATH LAB;  Service: Cardiovascular;  Laterality: Right;  . Carotid stent insertion N/A 01/18/2013    Procedure: CAROTID STENT INSERTION;  Surgeon: Serafina Mitchell, MD;  Location: [facility] CATH LAB;  Service: Cardiovascular;  Laterality: N/A;    History   Social History  . Marital Status: Married    Spouse Name: N/A    Number of Children: 4  . Years of Education: N/A   Occupational History  . retired     Social History Main Topics  .  Smoking status: Current Every Day Smoker -- 1.00 packs/day for 40 years    Types: Cigarettes  . Smokeless tobacco: Former Systems developer    Types: [place] date: 09/08/1978     Comment: 1 to 1.5 PPD  . Alcohol Use: 0.0 oz/week    0 Not specified per week     Comment: socially   . Drug Use: No  . Sexual Activity: Not on file   Other Topics Concern  . Not on file   Social History Narrative   HS Diploma.   Household-- wife, 2 of their children with  their families     Family History  Problem Relation Age of Onset  . Hypertension      family history  . Coronary artery disease      family history  . Cancer - Colon Neg Hx   . Prostate cancer Neg Hx   . Diabetes Neg Hx   . Hyperlipidemia Mother   . Hypertension Mother     Allergies as of 09/11/2014 - Review Complete 09/11/2014  Allergen Reaction Noted  . Penicillins Rash 06/30/2011    Current Outpatient Prescriptions on File Prior to Visit  Medication Sig Dispense Refill  . aspirin 81 MG tablet Take 81 mg by mouth daily.      Marland Kitchen atorvastatin (LIPITOR) 40 MG tablet take 1 tablet by mouth once daily 30 tablet 2  . carvedilol (  COREG) 6.25 MG tablet take 1 tablet by mouth twice a day 60 tablet 3  . clopidogrel (PLAVIX) 75 MG tablet take 1 tablet by mouth once daily 30 tablet 2  . diazepam (VALIUM) 5 MG tablet Take 1 tablet (5 mg total) by mouth every 12 (twelve) hours as needed for anxiety. 60 tablet 1  . escitalopram (LEXAPRO) 10 MG tablet Take 1.5 tablets (15 mg total) by mouth daily. 60 tablet 6  . nitroGLYCERIN (NITROSTAT) 0.4 MG SL tablet Place 1 tablet (0.4 mg total) under the tongue every 5 (five) minutes as needed for chest pain. 25 tablet 3  . ramipril (ALTACE) 2.5 MG capsule take 1 capsule by mouth once daily 30 capsule 3   No current facility-administered medications on file prior to visit.     REVIEW OF SYSTEMS: Cardiovascular: No chest pain, chest pressure, palpitations, orthopnea, or dyspnea on exertion. No  claudication or rest pain,  No history of DVT or phlebitis. Pulmonary: No productive cough, asthma or wheezing. Neurologic: No weakness, paresthesias, aphasia, or amaurosis. No dizziness. Hematologic: No bleeding problems or clotting disorders. Musculoskeletal: No joint pain or joint swelling. Gastrointestinal: No blood in stool or hematemesis Genitourinary: No dysuria or hematuria. Psychiatric:: No history of major depression. Integumentary: No rashes or ulcers. Constitutional: No fever or chills.  PHYSICAL EXAMINATION:   Vital signs are BP 102/71 mmHg  Pulse 55  Ht 5\' 9"  (1.753 m)  Wt 135 lb 12.8 oz (61.598 kg)  BMI 20.04 kg/m2  SpO2 100% General: The patient appears their stated age. HEENT:  No gross abnormalities Pulmonary:  Non labored breathing Abdomen: Soft and non-tender.  No pulsatile mass Musculoskeletal: There are no major deformities. Neurologic: No focal weakness or paresthesias are detected, Skin: There are no ulcer or rashes noted. Psychiatric: The patient has normal affect. Cardiovascular: There is a regular rate and rhythm without significant murmur appreciated.  Right groin bruit   Diagnostic Studies I have reviewed the patient's carotid ultrasound today.  The right carotid stent is widely patent.  There is no restenosis or hyperplasia.  There were elevated velocities with poststenotic turbulence in the proximal left common carotid artery due to anatomic location and this could not be thoroughly evaluated.  Less than 40% left internal carotid stenosis  Assessment: #1: Carotid stenosis #2: Right groin AV fistula Plan: #1: The patient's right-sided carotid system remains widely patent.  Ultrasound identified a stenosis within the left common carotid artery, however this could not be adequately visualized to be evaluated.  Angiographically, it measured about 65% a year ago.  The patient will follow-up in 6 months with a repeat carotid ultrasound.  If I cannot get a  more accurate representation of the degree of stenosis within the left common carotid artery, the patient will need additional diagnostic testing.  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  The patient has some multiple catheterizations in the past.  It is uncertain when this occurred.  Regardless, the patient is asymptomatic.  I'll continue to monitor this with no definitive plans for repair at this time.  Eldridge Abrahams, M.D. [facility] Office: [phone] Pager:  [phone]

## 2014-10-06 ENCOUNTER — Telehealth: Payer: Self-pay | Admitting: Internal Medicine

## 2014-10-06 ENCOUNTER — Other Ambulatory Visit: Payer: Self-pay | Admitting: Cardiology

## 2014-10-09 NOTE — Telephone Encounter (Signed)
Faxed to [facility] pharmacy

## 2014-10-09 NOTE — Telephone Encounter (Signed)
Pt is requesting refill on Diazepam.   Last OV: 04/25/2014 Last Fill: 05/29/2014 # 60 1RF UDS: 04/25/2014 Low risk  Please advise.

## 2014-10-09 NOTE — Telephone Encounter (Signed)
Ok 60, no RF 

## 2014-10-19 ENCOUNTER — Encounter (HOSPITAL_COMMUNITY): Payer: Self-pay | Admitting: Emergency Medicine

## 2014-10-19 ENCOUNTER — Emergency Department (HOSPITAL_COMMUNITY): Payer: Medicare Other

## 2014-10-19 ENCOUNTER — Emergency Department (HOSPITAL_COMMUNITY)
Admission: EM | Admit: 2014-10-19 | Discharge: 2014-10-19 | Disposition: A | Discharge status: Home / Self Care | Payer: Medicare Other | Attending: Emergency Medicine | Admitting: Emergency Medicine

## 2014-10-19 DIAGNOSIS — I252 Old myocardial infarction: Secondary | ICD-10-CM | POA: Diagnosis not present

## 2014-10-19 DIAGNOSIS — Z720 Tobacco use: Secondary | ICD-10-CM | POA: Diagnosis not present

## 2014-10-19 DIAGNOSIS — Z9861 Coronary angioplasty status: Secondary | ICD-10-CM | POA: Diagnosis not present

## 2014-10-19 DIAGNOSIS — Z9889 Other specified postprocedural states: Secondary | ICD-10-CM | POA: Insufficient documentation

## 2014-10-19 DIAGNOSIS — I2510 Atherosclerotic heart disease of native coronary artery without angina pectoris: Secondary | ICD-10-CM | POA: Insufficient documentation

## 2014-10-19 DIAGNOSIS — Z7982 Long term (current) use of aspirin: Secondary | ICD-10-CM | POA: Insufficient documentation

## 2014-10-19 DIAGNOSIS — Z880 Allergy status to penicillin: Secondary | ICD-10-CM | POA: Diagnosis not present

## 2014-10-19 DIAGNOSIS — M25552 Pain in left hip: Secondary | ICD-10-CM | POA: Diagnosis present

## 2014-10-19 DIAGNOSIS — Z7902 Long term (current) use of antithrombotics/antiplatelets: Secondary | ICD-10-CM | POA: Insufficient documentation

## 2014-10-19 DIAGNOSIS — E785 Hyperlipidemia, unspecified: Secondary | ICD-10-CM | POA: Diagnosis not present

## 2014-10-19 DIAGNOSIS — M791 Myalgia: Secondary | ICD-10-CM | POA: Insufficient documentation

## 2014-10-19 DIAGNOSIS — M25452 Effusion, left hip: Secondary | ICD-10-CM | POA: Insufficient documentation

## 2014-10-19 DIAGNOSIS — I10 Essential (primary) hypertension: Secondary | ICD-10-CM | POA: Diagnosis not present

## 2014-10-19 DIAGNOSIS — M25559 Pain in unspecified hip: Secondary | ICD-10-CM

## 2014-10-19 MED ORDER — OXYCODONE-ACETAMINOPHEN 5-325 MG PO TABS: 1.0000 | ORAL_TABLET | ORAL | Status: DC | PRN

## 2014-10-19 MED ORDER — OXYCODONE-ACETAMINOPHEN 5-325 MG PO TABS
2.0000 | ORAL_TABLET | Freq: Once | ORAL | Status: AC
  Administered 2014-10-19: 2 via ORAL
  Filled 2014-10-19: qty 2

## 2014-10-19 NOTE — ED Provider Notes (Signed)
8:05 PM: At end of shift, hand off report received from [facility], PA-C.  Plan includes monitor for worsening swelling and re-eval after x-ray results.    9:15 PM: Pt reports pain significantly improved, able to ambulate without difficulty and x-ray negative for abnormality.  Wife reports no changes in swelling since arrival to ED.  Discussed case with Dr. Regenia Skeeter.  Pt is well-appearing, in no acute distress and vital signs reviewed and not concerning. He appears safe to be discharged.  Prescription for pain meds provided.  Discharge include follow-up with their PCP.  Return precautions provided including worsening pain, swelling, bruising, redness, numbness, weakness, fevers or any other concern.  Pt and wife report understanding and in agreement with plan.   Filed Vitals:   10/19/14 1923 10/19/14 2123  BP: 144/67 125/67  Pulse: 73 64  Temp: 97.6 F (36.4 C)   TempSrc: Oral   Resp: 20 14  Height: 5\' 10"  (1.778 m)   Weight: 136 lb (61.689 kg)   SpO2: 98% 96%      Britt Bottom, NP 10/19/14 2158  Ephraim Hamburger, MD 10/20/14 [phone]

## 2014-10-19 NOTE — Discharge Instructions (Signed)
Please follow the directions provided.  Be sure to follow-up with your primary care provider to ensure you are getting better.  Please take the pain med as needed.  Don't hesitate to return for any new, worsening or concerning symptoms.    SEEK MEDICAL CARE IF:  You are unable to put weight on your leg.  Your hip is red or swollen or very tender to touch.  Your pain or swelling continues or worsens after 1 week.  You have increasing difficulty walking.  You have a fever. SEEK IMMEDIATE MEDICAL CARE IF:  You have fallen.  You have a sudden increase in pain and swelling in your hip.

## 2014-10-19 NOTE — ED Provider Notes (Signed)
CSN: [id number]     Arrival date & time 10/19/14  1920 History  This chart was scribed for non-physician practitioner, Aaron Blitz, PA-C, working with Aaron Hamburger, MD, by Aaron Velez, ED Scribe. This patient was seen in room [place] and the patient's care was started at 7:38 PM.   Chief Complaint  Patient presents with  . Hip Pain     The history is provided by the patient. No language interpreter was used.     HPI Comments: Aaron Velez is a 54 y.o. male, with history of CAD, HTN, HLD, and MI, who presents to the Emergency Department complaining of significant swelling to the left buttock that began approximately 2-3 hours ago. Per wife, the swelling has doubled in size in the last hour. Patient reports associated 10/10 pain to the area that radiates to the left hip. Patient is ambulatory, but states he sometimes "hobbles around". He suspects he may have strained a muscle, but denies any recent falls, injury, or trauma. Patient is currently on a blood thinner (Plavix and aspirin) for "artery problems". He denies fever or chills.   Past Medical History  Diagnosis Date  . CAD (coronary artery disease)   . HTN (hypertension)   . HLD (hyperlipidemia)   . Myocardial infarction 2001 & 2008  . Shortness of breath   . Carotid artery occlusion    Past Surgical History  Procedure Laterality Date  . Carotid endarterectomy  12/05/2010    right CEA  . Coronary angioplasty with stent placement  2001,2007,2008  . Finger surgery      For brown recluse spider bite  . Percutaneous placement intravascular stent cervical carotid artery Right 01-2013  . Carotid angiogram Right 07/13/2012    Procedure: CAROTID ANGIOGRAM;  Surgeon: Aaron Mitchell, MD;  Location: [facility] CATH LAB;  Service: Cardiovascular;  Laterality: Right;  . Carotid stent insertion N/A 01/18/2013    Procedure: CAROTID STENT INSERTION;  Surgeon: Aaron Mitchell, MD;  Location: [facility] CATH LAB;  Service: Cardiovascular;   Laterality: N/A;   Family History  Problem Relation Age of Onset  . Hypertension      family history  . Coronary artery disease      family history  . Cancer - Colon Neg Hx   . Prostate cancer Neg Hx   . Diabetes Neg Hx   . Hyperlipidemia Mother   . Hypertension Mother    History  Substance Use Topics  . Smoking status: Current Every Day Smoker -- 1.00 packs/day for 40 years    Types: Cigarettes  . Smokeless tobacco: Former Systems developer    Types: [place] date: 09/08/1978     Comment: 1 to 1.5 PPD  . Alcohol Use: 0.0 oz/week    0 Standard drinks or equivalent per week     Comment: socially     Review of Systems  Constitutional: Negative for fever and chills.  Musculoskeletal: Positive for myalgias and arthralgias.      Allergies  Penicillins  Home Medications   Prior to Admission medications   Medication Sig Start Date End Date Taking? Authorizing Provider  aspirin 81 MG tablet Take 81 mg by mouth daily.      Historical Provider, MD  atorvastatin (LIPITOR) 40 MG tablet take 1 tablet by mouth once daily 10/09/14   Larey Dresser, MD  carvedilol (COREG) 6.25 MG tablet take 1 tablet by mouth twice a day 09/06/14   Larey Dresser, MD  clopidogrel (PLAVIX) 75  MG tablet take 1 tablet by mouth once daily 10/09/14   Larey Dresser, MD  diazepam (VALIUM) 5 MG tablet TAKE 1 TABLET EVERY 12 HOURS AS NEEDED FOR ANXIETY. 10/09/14   Colon Branch, MD  escitalopram (LEXAPRO) 10 MG tablet Take 1.5 tablets (15 mg total) by mouth daily. 04/25/14   Colon Branch, MD  nitroGLYCERIN (NITROSTAT) 0.4 MG SL tablet Place 1 tablet (0.4 mg total) under the tongue every 5 (five) minutes as needed for chest pain. 09/12/13   Larey Dresser, MD  ramipril (ALTACE) 2.5 MG capsule take 1 capsule by mouth once daily 09/06/14   Larey Dresser, MD   Triage Vitals: BP 144/67 mmHg  Pulse 73  Temp(Src) 97.6 F (36.4 C) (Oral)  Resp 20  Ht 5\' 10"  (1.778 m)  Wt 136 lb (61.689 kg)  BMI 19.51 kg/m2  SpO2  98%  Physical Exam  Constitutional: He is oriented to person, place, and time. He appears well-developed and well-nourished. No distress.  HENT:  Head: Normocephalic and atraumatic.  Eyes: Conjunctivae and EOM are normal.  Neck: Neck supple. No tracheal deviation present.  Cardiovascular: Normal rate.   Pulmonary/Chest: Effort normal. No respiratory distress.  Musculoskeletal: Normal range of motion. He exhibits tenderness.  Left gluteus is significantly swollen, approximately twice the size of the right, there are no ecchymoses, no overlying cellulitis or fluctuant areas. Patient has excellent range of motion to the left hip.   Neurological: He is alert and oriented to person, place, and time.  Skin: Skin is warm and dry.  Psychiatric: He has a normal mood and affect. His behavior is normal.  Nursing note and vitals reviewed.   ED Course  Procedures (including critical care time)  DIAGNOSTIC STUDIES: Oxygen Saturation is 98% on room air, normal by my interpretation.    COORDINATION OF CARE: At 1942 Discussed treatment plan with patient which includes consult with attending physician. Patient agrees.   Labs Review Labs Reviewed - No data to display  Imaging Review No results found.   EKG Interpretation None      MDM   Final diagnoses:  Hip pain     Filed Vitals:   10/19/14 1923  BP: 144/67  Pulse: 73  Temp: 97.6 F (36.4 C)  TempSrc: Oral  Resp: 20  Height: 5\' 10"  (1.778 m)  Weight: 136 lb (61.689 kg)  SpO2: 98%    Medications  oxyCODONE-acetaminophen (PERCOCET/ROXICET) 5-325 MG per tablet 2 tablet (2 tablets Oral Given 10/19/14 1958)    Aaron Velez is a pleasant 54 y.o. male presenting with acute onset of atraumatic left hip pain this evening at 5 PM, patient has rapidly progressing left gluteal swelling. No overt signs of ecchymoses, cellulitis. Patient has good range of motion to the hip. This is a shared visit with attending physician who is  personally evaluated the patient and performed a soft tissue ultrasound. Please discuss the case with radiology who recommends obtaining left hip x-ray to evaluate for occult fracture, plan is to control pain and apply ice to the area, we have the option of obtaining a left hip CT with contrast to further evaluate however the amount of radiation is significant, I have discussed these options with patient and his wife and in shared decision-making the would like to obtain x-ray and 8 on CT at this time. Case is signed out to NP Tysinger at shift change: Plan is to observe the patient in the ED for an hour to see  if swelling is rapidly progressing, reassess pain and follow-up x-ray.   I personally performed the services described in this documentation, which was scribed in my presence. The recorded information has been reviewed and is accurate.   Aaron Blitz, PA-C 10/19/14 2007  Aaron Hamburger, MD 10/20/14 [phone]

## 2014-10-19 NOTE — ED Notes (Signed)
Pt reports he began having left sided hip pain that radiates down his leg since 4pm this afternoon and has noticed swelling to left buttock. Pt denies any injury to area. Pt is alert and oriented.

## 2014-10-22 ENCOUNTER — Encounter (HOSPITAL_COMMUNITY): Payer: Self-pay | Admitting: Emergency Medicine

## 2014-10-22 ENCOUNTER — Emergency Department (HOSPITAL_COMMUNITY)
Admission: EM | Admit: 2014-10-22 | Discharge: 2014-10-22 | Disposition: A | Discharge status: Home / Self Care | Payer: Medicare Other | Attending: Emergency Medicine | Admitting: Emergency Medicine

## 2014-10-22 ENCOUNTER — Emergency Department (HOSPITAL_COMMUNITY): Payer: Medicare Other

## 2014-10-22 DIAGNOSIS — Z79899 Other long term (current) drug therapy: Secondary | ICD-10-CM | POA: Diagnosis not present

## 2014-10-22 DIAGNOSIS — M25552 Pain in left hip: Secondary | ICD-10-CM | POA: Diagnosis not present

## 2014-10-22 DIAGNOSIS — I10 Essential (primary) hypertension: Secondary | ICD-10-CM | POA: Insufficient documentation

## 2014-10-22 DIAGNOSIS — Z7982 Long term (current) use of aspirin: Secondary | ICD-10-CM | POA: Diagnosis not present

## 2014-10-22 DIAGNOSIS — Z880 Allergy status to penicillin: Secondary | ICD-10-CM | POA: Diagnosis not present

## 2014-10-22 DIAGNOSIS — M7981 Nontraumatic hematoma of soft tissue: Secondary | ICD-10-CM | POA: Insufficient documentation

## 2014-10-22 DIAGNOSIS — I252 Old myocardial infarction: Secondary | ICD-10-CM | POA: Insufficient documentation

## 2014-10-22 DIAGNOSIS — Z9861 Coronary angioplasty status: Secondary | ICD-10-CM | POA: Insufficient documentation

## 2014-10-22 DIAGNOSIS — I2510 Atherosclerotic heart disease of native coronary artery without angina pectoris: Secondary | ICD-10-CM | POA: Diagnosis not present

## 2014-10-22 DIAGNOSIS — M7918 Myalgia, other site: Secondary | ICD-10-CM

## 2014-10-22 DIAGNOSIS — T148XXA Other injury of unspecified body region, initial encounter: Secondary | ICD-10-CM

## 2014-10-22 DIAGNOSIS — Z7902 Long term (current) use of antithrombotics/antiplatelets: Secondary | ICD-10-CM | POA: Insufficient documentation

## 2014-10-22 DIAGNOSIS — Z720 Tobacco use: Secondary | ICD-10-CM | POA: Diagnosis not present

## 2014-10-22 DIAGNOSIS — E785 Hyperlipidemia, unspecified: Secondary | ICD-10-CM | POA: Diagnosis not present

## 2014-10-22 DIAGNOSIS — M25559 Pain in unspecified hip: Secondary | ICD-10-CM

## 2014-10-22 LAB — I-STAT CHEM 8, ED
BUN: 8 mg/dL (ref 6–23)
CALCIUM ION: 1.11 mmol/L — AB (ref 1.12–1.23)
CHLORIDE: 103 mmol/L (ref 96–112)
Creatinine, Ser: 0.7 mg/dL (ref 0.50–1.35)
GLUCOSE: 91 mg/dL (ref 70–99)
HCT: 38 % — ABNORMAL LOW (ref 39.0–52.0)
Hemoglobin: 12.9 g/dL — ABNORMAL LOW (ref 13.0–17.0)
Potassium: 4.3 mmol/L (ref 3.5–5.1)
SODIUM: 139 mmol/L (ref 135–145)
TCO2: 22 mmol/L (ref 0–100)

## 2014-10-22 LAB — CBC WITH DIFFERENTIAL/PLATELET
Basophils Absolute: 0.1 10*3/uL (ref 0.0–0.1)
Basophils Relative: 1 % (ref 0–1)
EOS PCT: 1 % (ref 0–5)
Eosinophils Absolute: 0.1 10*3/uL (ref 0.0–0.7)
HEMATOCRIT: 37.6 % — AB (ref 39.0–52.0)
HEMOGLOBIN: 12.4 g/dL — AB (ref 13.0–17.0)
LYMPHS ABS: 1.5 10*3/uL (ref 0.7–4.0)
Lymphocytes Relative: 15 % (ref 12–46)
MCH: 29 pg (ref 26.0–34.0)
MCHC: 33 g/dL (ref 30.0–36.0)
MCV: 88.1 fL (ref 78.0–100.0)
Monocytes Absolute: 0.9 10*3/uL (ref 0.1–1.0)
Monocytes Relative: 9 % (ref 3–12)
NEUTROS ABS: 7.8 10*3/uL — AB (ref 1.7–7.7)
NEUTROS PCT: 74 % (ref 43–77)
Platelets: 186 10*3/uL (ref 150–400)
RBC: 4.27 MIL/uL (ref 4.22–5.81)
RDW: 14.2 % (ref 11.5–15.5)
WBC: 10.4 10*3/uL (ref 4.0–10.5)

## 2014-10-22 LAB — CK: Total CK: 97 U/L (ref 7–232)

## 2014-10-22 LAB — I-STAT CG4 LACTIC ACID, ED: LACTIC ACID, VENOUS: 0.63 mmol/L (ref 0.5–2.0)

## 2014-10-22 MED ORDER — SODIUM CHLORIDE 0.9 % IV BOLUS (SEPSIS)
500.0000 mL | Freq: Once | INTRAVENOUS | Status: AC
  Administered 2014-10-22: 500 mL via INTRAVENOUS

## 2014-10-22 MED ORDER — IOHEXOL 300 MG/ML  SOLN
100.0000 mL | Freq: Once | INTRAMUSCULAR | Status: AC | PRN
  Administered 2014-10-22: 100 mL via INTRAVENOUS

## 2014-10-22 MED ORDER — OXYCODONE-ACETAMINOPHEN 5-325 MG PO TABS: 1.0000 | ORAL_TABLET | ORAL | Status: DC | PRN

## 2014-10-22 NOTE — ED Provider Notes (Signed)
CSN: [id number]     Arrival date & time 10/22/14  1052 History   First MD Initiated Contact with Patient 10/22/14 1102     Chief Complaint  Patient presents with  . hematoma      (Consider location/radiation/quality/duration/timing/severity/associated sxs/prior Treatment) The history is provided by the patient.   patient with pain in his left buttock and hip area. Began 3 days ago. States his been more swollen. Was seen in the ER and never x-ray at that time..Was that it may have been a hematoma due to his aspirin Plavix use. He states pain is not improved male gotten worse. No bruising. States it hurts just on that side. Some pain with walking. No fevers. No bruising. No loss of bladder bowel control. No trauma.  Past Medical History  Diagnosis Date  . CAD (coronary artery disease)   . HTN (hypertension)   . HLD (hyperlipidemia)   . Myocardial infarction 2001 & 2008  . Shortness of breath   . Carotid artery occlusion    Past Surgical History  Procedure Laterality Date  . Carotid endarterectomy  12/05/2010    right CEA  . Coronary angioplasty with stent placement  2001,2007,2008  . Finger surgery      For brown recluse spider bite  . Percutaneous placement intravascular stent cervical carotid artery Right 01-2013  . Carotid angiogram Right 07/13/2012    Procedure: CAROTID ANGIOGRAM;  Surgeon: Serafina Mitchell, MD;  Location: [facility] CATH LAB;  Service: Cardiovascular;  Laterality: Right;  . Carotid stent insertion N/A 01/18/2013    Procedure: CAROTID STENT INSERTION;  Surgeon: Serafina Mitchell, MD;  Location: [facility] CATH LAB;  Service: Cardiovascular;  Laterality: N/A;   Family History  Problem Relation Age of Onset  . Hypertension      family history  . Coronary artery disease      family history  . Cancer - Colon Neg Hx   . Prostate cancer Neg Hx   . Diabetes Neg Hx   . Hyperlipidemia Mother   . Hypertension Mother    History  Substance Use Topics  . Smoking status: Current  Every Day Smoker -- 1.00 packs/day for 40 years    Types: Cigarettes  . Smokeless tobacco: Former Systems developer    Types: [place] date: 09/08/1978     Comment: 1 to 1.5 PPD  . Alcohol Use: 0.0 oz/week    0 Standard drinks or equivalent per week     Comment: socially     Review of Systems  Constitutional: Negative for fever and chills.  Respiratory: Negative for cough.   Cardiovascular: Negative for leg swelling.  Gastrointestinal: Negative for abdominal pain.  Musculoskeletal:        Left buttock/hip pain.  Skin: Negative for wound.  Neurological: Negative for numbness.  Hematological: Bruises/bleeds easily.      Allergies  Penicillins  Home Medications   Prior to Admission medications   Medication Sig Start Date End Date Taking? Authorizing Provider  aspirin 81 MG tablet Take 81 mg by mouth daily.     Yes Historical Provider, MD  atorvastatin (LIPITOR) 40 MG tablet take 1 tablet by mouth once daily 10/09/14  Yes Larey Dresser, MD  carvedilol (COREG) 6.25 MG tablet take 1 tablet by mouth twice a day 09/06/14  Yes Larey Dresser, MD  clopidogrel (PLAVIX) 75 MG tablet take 1 tablet by mouth once daily 10/09/14  Yes Larey Dresser, MD  diazepam (VALIUM) 5 MG tablet TAKE  1 TABLET EVERY 12 HOURS AS NEEDED FOR ANXIETY. 10/09/14  Yes Colon Branch, MD  escitalopram (LEXAPRO) 10 MG tablet Take 1.5 tablets (15 mg total) by mouth daily. Patient taking differently: Take 10 mg by mouth daily.  04/25/14  Yes Colon Branch, MD  nitroGLYCERIN (NITROSTAT) 0.4 MG SL tablet Place 1 tablet (0.4 mg total) under the tongue every 5 (five) minutes as needed for chest pain. 09/12/13  Yes Larey Dresser, MD  oxyCODONE-acetaminophen (PERCOCET/ROXICET) 5-325 MG per tablet Take 1 tablet by mouth every 4 (four) hours as needed for moderate pain or severe pain. 10/19/14  Yes Britt Bottom, NP  ramipril (ALTACE) 2.5 MG capsule take 1 capsule by mouth once daily 09/06/14  Yes Larey Dresser, MD   BP 111/78 mmHg   Pulse 90  Temp(Src) 98.1 F (36.7 C) (Oral)  Resp 20  SpO2 100% Physical Exam  Constitutional: He is oriented to person, place, and time. He appears well-developed and well-nourished.  HENT:  Head: Atraumatic.  Abdominal: Soft. There is no tenderness.  Musculoskeletal:  Some tenderness over left buttock posteriorly and somewhat laterally. Mild swelling. Some pain with balloon of the hip but hip is not irritable. Strong femoral pulse on left.  Neurological: He is alert and oriented to person, place, and time.  Skin: Skin is warm.    ED Course  Procedures (including critical care time) Labs Review Labs Reviewed  CBC WITH DIFFERENTIAL/PLATELET - Abnormal; Notable for the following:    Hemoglobin 12.4 (*)    HCT 37.6 (*)    Neutro Abs 7.8 (*)    All other components within normal limits  I-STAT CHEM 8, ED - Abnormal; Notable for the following:    Calcium, Ion 1.11 (*)    Hemoglobin 12.9 (*)    HCT 38.0 (*)    All other components within normal limits  CK  I-STAT CG4 LACTIC ACID, ED    Imaging Review No results found.   EKG Interpretation None      MDM   Final diagnoses:  Buttock pain  Hip pain    Patient with hematoma and his buttock. Likely tear for muscle and tendon. Hemoglobin overall reassuring. On aspirin Plavix. We'll continue at this time due to his coronary disease. Will increase some pain medicin  and have follow-up with Ortho as needed.   Jasper Riling. Alvino Chapel, MD 10/22/14 [phone]

## 2014-10-22 NOTE — ED Notes (Signed)
Per pt, was seen here for same symptoms on the 11th-bruise on left buttock-getting bigger-no initial injury

## 2014-10-22 NOTE — ED Notes (Signed)
MD Pickering at bedside.  

## 2014-10-22 NOTE — ED Notes (Signed)
Pt alert, oriented, and ambulatory upon DC. He was advised to follow up with MD Alusio with orthopedics if not better. He ambulates to lobby with DC papers and prescriptions in hand.

## 2014-10-22 NOTE — Discharge Instructions (Signed)
You have a tear of the muscle and tendon. Follow-up with orthopedics as needed.  Hematoma A hematoma is a collection of blood under the skin, in an organ, in a body space, in a joint space, or in other tissue. The blood can clot to form a lump that you can see and feel. The lump is often firm and may sometimes become sore and tender. Most hematomas get better in a few days to weeks. However, some hematomas may be serious and require medical care. Hematomas can range in size from very small to very large. CAUSES  A hematoma can be caused by a blunt or penetrating injury. It can also be caused by spontaneous leakage from a blood vessel under the skin. Spontaneous leakage from a blood vessel is more likely to occur in older people, especially those taking blood thinners. Sometimes, a hematoma can develop after certain medical procedures. SIGNS AND SYMPTOMS   A firm lump on the body.  Possible pain and tenderness in the area.  Bruising.Blue, dark blue, purple-red, or yellowish skin may appear at the site of the hematoma if the hematoma is close to the surface of the skin. For hematomas in deeper tissues or body spaces, the signs and symptoms may be subtle. For example, an intra-abdominal hematoma may cause abdominal pain, weakness, fainting, and shortness of breath. An intracranial hematoma may cause a headache or symptoms such as weakness, trouble speaking, or a change in consciousness. DIAGNOSIS  A hematoma can usually be diagnosed based on your medical history and a physical exam. Imaging tests may be needed if your health care provider suspects a hematoma in deeper tissues or body spaces, such as the abdomen, head, or chest. These tests may include ultrasonography or a CT scan.  TREATMENT  Hematomas usually go away on their own over time. Rarely does the blood need to be drained out of the body. Large hematomas or those that may affect vital organs will sometimes need surgical drainage or  monitoring. HOME CARE INSTRUCTIONS   Apply ice to the injured area:   Put ice in a plastic bag.   Place a towel between your skin and the bag.   Leave the ice on for 20 minutes, 2-3 times a day for the first 1 to 2 days.   After the first 2 days, switch to using warm compresses on the hematoma.   Elevate the injured area to help decrease pain and swelling. Wrapping the area with an elastic bandage may also be helpful. Compression helps to reduce swelling and promotes shrinking of the hematoma. Make sure the bandage is not wrapped too tight.   If your hematoma is on a lower extremity and is painful, crutches may be helpful for a couple days.   Only take over-the-counter or prescription medicines as directed by your health care provider. SEEK IMMEDIATE MEDICAL CARE IF:   You have increasing pain, or your pain is not controlled with medicine.   You have a fever.   You have worsening swelling or discoloration.   Your skin over the hematoma breaks or starts bleeding.   Your hematoma is in your chest or abdomen and you have weakness, shortness of breath, or a change in consciousness.  Your hematoma is on your scalp (caused by a fall or injury) and you have a worsening headache or a change in alertness or consciousness. MAKE SURE YOU:   Understand these instructions.  Will watch your condition.  Will get help right away if you are  not doing well or get worse. Document Released: 04/08/2004 Document Revised: 04/27/2013 Document Reviewed: 02/02/2013 [facility] Patient Information 2015 [place], [place]. This information is not intended to replace advice given to you by your health care provider. Make sure you discuss any questions you have with your health care provider.

## 2014-10-26 ENCOUNTER — Ambulatory Visit (INDEPENDENT_AMBULATORY_CARE_PROVIDER_SITE_OTHER): Payer: Medicare Other | Admitting: Internal Medicine

## 2014-10-26 ENCOUNTER — Encounter: Payer: Self-pay | Admitting: Internal Medicine

## 2014-10-26 VITALS — BP 112/68 | HR 67 | Temp 98.2°F | Ht 70.0 in | Wt 136.0 lb

## 2014-10-26 DIAGNOSIS — F419 Anxiety disorder, unspecified: Secondary | ICD-10-CM

## 2014-10-26 DIAGNOSIS — R6882 Decreased libido: Secondary | ICD-10-CM

## 2014-10-26 DIAGNOSIS — I6523 Occlusion and stenosis of bilateral carotid arteries: Secondary | ICD-10-CM

## 2014-10-26 DIAGNOSIS — F17200 Nicotine dependence, unspecified, uncomplicated: Secondary | ICD-10-CM

## 2014-10-26 DIAGNOSIS — T148XXA Other injury of unspecified body region, initial encounter: Secondary | ICD-10-CM

## 2014-10-26 DIAGNOSIS — T148 Other injury of unspecified body region: Secondary | ICD-10-CM

## 2014-10-26 DIAGNOSIS — Z720 Tobacco use: Secondary | ICD-10-CM

## 2014-10-26 HISTORY — DX: Decreased libido: R68.82

## 2014-10-26 NOTE — Progress Notes (Signed)
Subjective:    Patient ID: Aaron Velez, male    DOB: 1961/04/04, 54 y.o.   MRN: [id number]  DOS:  10/26/2014 Type of visit - description : rov Interval history: Martin Majestic to the ER twice, February 11 and February 14, notes reviewed. He developed a spontaneous hematoma at the left gluteal area. Overall, pain is decreasing, range of motion improving, has developed some bruising distal from the hematoma (which is expected). CT 10-22-14 from the ER Heterogeneous mass around the posterior aspect of the LEFT greater trochanter. In this patient on anti coagulation, the appearance is compatible with hematoma from a partial tear of the distal gluteal musculotendinous junctions. Clinical followup will suffice. Repeat imaging is only needed if there are persistent symptoms or failure to resolve.  Anxiety depression, on Lexapro, symptoms not completely well control, wife left his house last month. Currently he sees a Social worker. Hypertension, good medication compliance, not ambulatory BPs Cholesterol, good medication compliance, no apparent side effects Tobacco- Still smoking at least a pack a day  Review of Systems Denies chest pain or difficulty breathing No nausea, vomiting, diarrhea No suicidal ideas No headache, dizziness, diplopia, slurred speech or motor deficits  Past Medical History  Diagnosis Date  . CAD (coronary artery disease)   . HTN (hypertension)   . HLD (hyperlipidemia)   . Myocardial infarction 2001 & 2008  . Shortness of breath   . Carotid artery occlusion     Past Surgical History  Procedure Laterality Date  . Carotid endarterectomy  12/05/2010    right CEA  . Coronary angioplasty with stent placement  2001,2007,2008  . Finger surgery      For brown recluse spider bite  . Percutaneous placement intravascular stent cervical carotid artery Right 01-2013  . Carotid angiogram Right 07/13/2012    Procedure: CAROTID ANGIOGRAM;  Surgeon: Serafina Mitchell, MD;  Location: [facility]  CATH LAB;  Service: Cardiovascular;  Laterality: Right;  . Carotid stent insertion N/A 01/18/2013    Procedure: CAROTID STENT INSERTION;  Surgeon: Serafina Mitchell, MD;  Location: [facility] CATH LAB;  Service: Cardiovascular;  Laterality: N/A;    History   Social History  . Marital Status: Married    Spouse Name: N/A  . Number of Children: 4  . Years of Education: N/A   Occupational History  . retired Animal nutritionist    Social History Main Topics  . Smoking status: Current Every Day Smoker -- 1.00 packs/day for 40 years    Types: Cigarettes  . Smokeless tobacco: Former Systems developer    Types: [place] date: 09/08/1978     Comment: 1 to 1.5 PPD  . Alcohol Use: 0.0 oz/week    0 Standard drinks or equivalent per week     Comment: socially   . Drug Use: No  . Sexual Activity: Not on file   Other Topics Concern  . Not on file   Social History Narrative   HS Diploma.   Household-- wife moved out 09-2014,    2 sons, has not talked to them in a while    2 daughters         Medication List       This list is accurate as of: 10/26/14  6:42 PM.  Always use your most recent med list.               aspirin 81 MG tablet  Take 81 mg by mouth daily.     atorvastatin 40 MG tablet  Commonly known as:  LIPITOR  take 1 tablet by mouth once daily     carvedilol 6.25 MG tablet  Commonly known as:  COREG  take 1 tablet by mouth twice a day     clopidogrel 75 MG tablet  Commonly known as:  PLAVIX  take 1 tablet by mouth once daily     diazepam 5 MG tablet  Commonly known as:  VALIUM  TAKE 1 TABLET EVERY 12 HOURS AS NEEDED FOR ANXIETY.     escitalopram 10 MG tablet  Commonly known as:  LEXAPRO  Take 10 mg by mouth daily.     nitroGLYCERIN 0.4 MG SL tablet  Commonly known as:  NITROSTAT  Place 1 tablet (0.4 mg total) under the tongue every 5 (five) minutes as needed for chest pain.     oxyCODONE-acetaminophen 5-325 MG per tablet  Commonly known as:  PERCOCET/ROXICET  Take 1 tablet  by mouth every 4 (four) hours as needed for moderate pain or severe pain.     ramipril 2.5 MG capsule  Commonly known as:  ALTACE  take 1 capsule by mouth once daily           Objective:   Physical Exam BP 112/68 mmHg  Pulse 67  Temp(Src) 98.2 F (36.8 C) (Oral)  Ht 5\' 10"  (1.778 m)  Wt 136 lb (61.689 kg)  BMI 19.51 kg/m2  SpO2 98%  General:   Well developed, well nourished . NAD.  HEENT:  Normocephalic . Face symmetric, atraumatic Lungs:  CTA B Normal respiratory effort, no intercostal retractions, no accessory muscle use. Heart: RRR,  no murmur.  Muscle skeletal: Left gluteal area slightly swollen, moderately TTP, no warm, no bruises or redness. He does have some bruises on the posterior aspect of the left leg, most likely related to the  proximally  located hematoma Neurologic:  alert & oriented X3.  Speech normal, gait antalgic but unassisted Psych--  Cognition and judgment appear intact.  Cooperative with normal attention span and concentration.  Behavior appropriate. No anxious or depressed appearing.       Assessment & Plan:   Hematoma, left gluteal area Hematoma was spontaneous  although he remembered doing some lifting before it happened. at this point he seems to be gradually improving, recommend observation, will call if improvement does not continue

## 2014-10-26 NOTE — Progress Notes (Signed)
Pre visit review using our clinic review tool, if applicable. No additional management support is needed unless otherwise documented below in the visit note. 

## 2014-10-26 NOTE — Assessment & Plan Note (Signed)
Complains of decreased libido for a while, most likely related to anxiety and poor relationship with wife. For completeness we will check a testosterone level knowing that w/ his cardiovascular disease HRT may not be indicated

## 2014-10-26 NOTE — Patient Instructions (Signed)
Get your blood work before you leave    Please come back to the office by 02-2015 for a physical exam. Come back fasting      If you need more information about quitting tobacco visit  the American Heart Association, it  is a great resource online at:  http://www.richard-flynn.net/

## 2014-10-26 NOTE — Assessment & Plan Note (Signed)
Not well controlled, counseling the best of my ability, recommend to continue seeing a counselor. He is currently taking Lexapro 10 mg daily and does not like to change

## 2014-10-26 NOTE — Assessment & Plan Note (Addendum)
Patient continue smoking, we  gain talk about this issue,  knows is critical to stop tobacco to prevent for cardiovascular events. Encouraged him to visit the [place] website

## 2014-10-27 LAB — TESTOSTERONE, FREE, TOTAL, SHBG
SEX HORMONE BINDING: 66 nmol/L — AB (ref 10–50)
TESTOSTERONE: 563 ng/dL (ref 300–890)
Testosterone, Free: 74.1 pg/mL (ref 47.0–244.0)
Testosterone-% Free: 1.3 % — ABNORMAL LOW (ref 1.6–2.9)

## 2014-11-14 ENCOUNTER — Other Ambulatory Visit: Payer: Self-pay | Admitting: Internal Medicine

## 2014-11-14 NOTE — Telephone Encounter (Signed)
Rx faxed to [facility] pharmacy.  

## 2014-11-14 NOTE — Telephone Encounter (Signed)
Please print #[id number] and no refills

## 2014-11-14 NOTE — Telephone Encounter (Signed)
Rx printed, awaiting signature by Dr. Paz.  

## 2014-11-14 NOTE — Telephone Encounter (Signed)
Pt is requesting a refill on Diazepam.  Last OV: 10/26/2014 Last Fill: 10/09/2014 # [id number] UDS: 04/25/2014 Low risk  Please advise.

## 2014-12-22 ENCOUNTER — Ambulatory Visit (INDEPENDENT_AMBULATORY_CARE_PROVIDER_SITE_OTHER): Payer: Medicare Other | Admitting: Internal Medicine

## 2014-12-22 ENCOUNTER — Encounter: Payer: Self-pay | Admitting: Internal Medicine

## 2014-12-22 ENCOUNTER — Ambulatory Visit (HOSPITAL_BASED_OUTPATIENT_CLINIC_OR_DEPARTMENT_OTHER)
Admission: RE | Admit: 2014-12-22 | Discharge: 2014-12-22 | Disposition: A | Discharge status: Home / Self Care | Payer: Medicare Other | Source: Ambulatory Visit | Attending: Internal Medicine | Admitting: Internal Medicine

## 2014-12-22 VITALS — BP 118/58 | HR 75 | Temp 98.0°F | Ht 70.0 in | Wt 127.2 lb

## 2014-12-22 DIAGNOSIS — R634 Abnormal weight loss: Secondary | ICD-10-CM

## 2014-12-22 DIAGNOSIS — I6523 Occlusion and stenosis of bilateral carotid arteries: Secondary | ICD-10-CM

## 2014-12-22 DIAGNOSIS — Z0000 Encounter for general adult medical examination without abnormal findings: Secondary | ICD-10-CM

## 2014-12-22 HISTORY — DX: Abnormal weight loss: R63.4

## 2014-12-22 MED ORDER — RAMIPRIL 2.5 MG PO CAPS: 2.5000 mg | ORAL_CAPSULE | Freq: Every day | ORAL | Status: DC

## 2014-12-22 MED ORDER — CARVEDILOL 6.25 MG PO TABS: 6.2500 mg | ORAL_TABLET | Freq: Two times a day (BID) | ORAL | Status: AC

## 2014-12-22 MED ORDER — ESCITALOPRAM OXALATE 10 MG PO TABS: 10.0000 mg | ORAL_TABLET | Freq: Every day | ORAL | Status: DC

## 2014-12-22 NOTE — Patient Instructions (Signed)
Get your blood work before you leave   Stop by the first floor and get the XR    Come back to the office in 2 months   for a routine check up

## 2014-12-22 NOTE — Progress Notes (Signed)
Subjective:    Patient ID: Aaron Velez, male    DOB: 09-03-1961, 54 y.o.   MRN: [id number]  DOS:  12/22/2014 Type of visit - description : acute, CC weight loss Interval history: The patient's main concern today is weight loss, when I stepped into the room the first thing he said was "my wife left". They weight loss is unexpected although he recognizes she is not eating healthy, does not cook, usually eats a frozen pizza or something from the gas station. When asked about how is he doing emotionally he said he is doing okay, good compliance with Lexapro, he sees a Social worker.  He disagreed with me, thinks weight loss is not related to the fact that his wife left, states depression is ok . His main concern is to make sure he doesn't have cancer somewhere.    Review of Systems   denies fever, chills, any labs or night sweats No lower extremity edema or palpitations No nausea, vomiting, blood in the stools. Occasional diarrhea when he gets nervous. No hemoptysis, he has increased his tobacco 2 sometimes 3 pack a day. No dysuria gross hematuria.  Past Medical History  Diagnosis Date  . CAD (coronary artery disease)   . HTN (hypertension)   . HLD (hyperlipidemia)   . Myocardial infarction 2001 & 2008  . Shortness of breath   . Carotid artery occlusion     Past Surgical History  Procedure Laterality Date  . Carotid endarterectomy  12/05/2010    right CEA  . Coronary angioplasty with stent placement  2001,2007,2008  . Finger surgery      For brown recluse spider bite  . Percutaneous placement intravascular stent cervical carotid artery Right 01-2013  . Carotid angiogram Right 07/13/2012    Procedure: CAROTID ANGIOGRAM;  Surgeon: Serafina Mitchell, MD;  Location: [facility] CATH LAB;  Service: Cardiovascular;  Laterality: Right;  . Carotid stent insertion N/A 01/18/2013    Procedure: CAROTID STENT INSERTION;  Surgeon: Serafina Mitchell, MD;  Location[facility] CATH LAB;  Service: Cardiovascular;   Laterality: N/A;    History   Social History  . Marital Status: Married    Spouse Name: N/A  . Number of Children: 4  . Years of Education: N/A   Occupational History  . retired Animal nutritionist    Social History Main Topics  . Smoking status: Current Every Day Smoker -- 1.00 packs/day for 40 years    Types: Cigarettes  . Smokeless tobacco: Former Systems developer    Types: [place] date: 09/08/1978     Comment: 1 to 1.5 PPD  . Alcohol Use: 0.0 oz/week    0 Standard drinks or equivalent per week     Comment: socially   . Drug Use: No  . Sexual Activity: Not on file   Other Topics Concern  . Not on file   Social History Narrative   HS Diploma.   Household-- wife moved out 09-2014,    2 sons, has not talked to them in a while    2 daughters         Medication List       This list is accurate as of: 12/22/14 11:59 PM.  Always use your most recent med list.               aspirin 81 MG tablet  Take 81 mg by mouth daily.     atorvastatin 40 MG tablet  Commonly known as:  LIPITOR  take 1 tablet by mouth once daily     carvedilol 6.25 MG tablet  Commonly known as:  COREG  Take 1 tablet (6.25 mg total) by mouth 2 (two) times daily.     clopidogrel 75 MG tablet  Commonly known as:  PLAVIX  take 1 tablet by mouth once daily     diazepam 5 MG tablet  Commonly known as:  VALIUM  TAKE 1 TABLET EVERY 12 HOURS AS NEEDED FOR ANXIETY.     escitalopram 10 MG tablet  Commonly known as:  LEXAPRO  Take 1 tablet (10 mg total) by mouth daily.     nitroGLYCERIN 0.4 MG SL tablet  Commonly known as:  NITROSTAT  Place 1 tablet (0.4 mg total) under the tongue every 5 (five) minutes as needed for chest pain.     oxyCODONE-acetaminophen 5-325 MG per tablet  Commonly known as:  PERCOCET/ROXICET  Take 1 tablet by mouth every 4 (four) hours as needed for moderate pain or severe pain.     ramipril 2.5 MG capsule  Commonly known as:  ALTACE  Take 1 capsule (2.5 mg total) by mouth  daily.           Objective:   Physical Exam BP 118/58 mmHg  Pulse 75  Temp(Src) 98 F (36.7 C) (Oral)  Ht 5\' 10"  (1.778 m)  Wt 127 lb 4 oz (57.72 kg)  BMI 18.26 kg/m2  SpO2 98% General:   Well developed, .slightly under weight appearing  HEENT:  Normocephalic . Face symmetric, atraumatic, not pale or jaundice Neck: No neck mass or supraclavicular mass Lungs:  CTA B Normal respiratory effort, no intercostal retractions, no accessory muscle use. Heart: RRR,  no murmur.  Abdomen:  Not distended, soft, non-tender. No rebound or rigidity. No mass,organomegaly Muscle skeletal: no pretibial edema bilaterally  Skin: Not pale. Not jaundice Neurologic:  alert & oriented X3.  Speech normal, gait appropriate for age and unassisted Psych--  Cognition and judgment appear intact.  Cooperative with normal attention span and concentration.  Behavior apprslightly anxious but not depressed appearing    Assessment & Plan:

## 2014-12-22 NOTE — Progress Notes (Signed)
Pre visit review using our clinic review tool, if applicable. No additional management support is needed unless otherwise documented below in the visit note. 

## 2014-12-23 LAB — URINALYSIS, ROUTINE W REFLEX MICROSCOPIC
Bilirubin Urine: NEGATIVE
Glucose, UA: NEGATIVE mg/dL
HGB URINE DIPSTICK: NEGATIVE
Ketones, ur: NEGATIVE mg/dL
Nitrite: NEGATIVE
Protein, ur: NEGATIVE mg/dL
Specific Gravity, Urine: <1.005 — ABNORMAL LOW (ref 1.005–1.030)
UROBILINOGEN UA: 0.2 mg/dL (ref 0.0–1.0)
pH: 6 (ref 5.0–8.0)

## 2014-12-23 LAB — URINALYSIS, MICROSCOPIC ONLY
CRYSTALS: NONE SEEN
Casts: NONE SEEN
Squamous Epithelial / LPF: NONE SEEN

## 2014-12-23 LAB — CBC WITH DIFFERENTIAL/PLATELET
Basophils Absolute: 0.1 10*3/uL (ref 0.0–0.1)
Basophils Relative: 1 % (ref 0–1)
Eosinophils Absolute: 0.2 10*3/uL (ref 0.0–0.7)
Eosinophils Relative: 2 % (ref 0–5)
HCT: 41.4 % (ref 39.0–52.0)
Hemoglobin: 13.7 g/dL (ref 13.0–17.0)
Lymphocytes Relative: 18 % (ref 12–46)
Lymphs Abs: 1.7 10*3/uL (ref 0.7–4.0)
MCH: 28.7 pg (ref 26.0–34.0)
MCHC: 33.1 g/dL (ref 30.0–36.0)
MCV: 86.6 fL (ref 78.0–100.0)
MPV: 10.9 fL (ref 8.6–12.4)
Monocytes Absolute: 0.8 10*3/uL (ref 0.1–1.0)
Monocytes Relative: 9 % (ref 3–12)
Neutro Abs: 6.6 10*3/uL (ref 1.7–7.7)
Neutrophils Relative %: 70 % (ref 43–77)
Platelets: 246 10*3/uL (ref 150–400)
RBC: 4.78 MIL/uL (ref 4.22–5.81)
RDW: 14.7 % (ref 11.5–15.5)
WBC: 9.4 10*3/uL (ref 4.0–10.5)

## 2014-12-23 LAB — TSH: TSH: 0.9 u[IU]/mL (ref 0.350–4.500)

## 2014-12-23 LAB — HEMOGLOBIN A1C
Hgb A1c MFr Bld: 5.7 % — ABNORMAL HIGH (ref <5.7)
MEAN PLASMA GLUCOSE: 117 mg/dL — AB (ref <117)

## 2014-12-23 NOTE — Assessment & Plan Note (Signed)
He has lost 7 pounds since January in the context of his wife leaving him in January 2016. The last time she was in their house was 11/01/2014. Review of systems is negative for worrisome symptoms, exam is benign except for the weight loss. I do believe anxiety/depression are  playing a major role on his weight loss but he disagrees, states that he is doing okay emotionally. He likes to be sure he doesn't have cancer. Last DRE 02-2014 negative. Plan: Chest x-ray CBC, A1c, TSH Continue seeing the counselor Refer to GI for colonoscopy Declined to increase Lexapro Follow-up 2 months  Also, multiple meds refilled

## 2014-12-26 NOTE — Addendum Note (Signed)
Addended by: Wilfrid Lund on: 12/26/2014 01:25 PM   Modules accepted: Orders

## 2014-12-29 ENCOUNTER — Encounter: Payer: Self-pay | Admitting: Internal Medicine

## 2015-01-18 ENCOUNTER — Encounter: Payer: Self-pay | Admitting: Internal Medicine

## 2015-01-18 ENCOUNTER — Ambulatory Visit (INDEPENDENT_AMBULATORY_CARE_PROVIDER_SITE_OTHER): Payer: Medicare Other | Admitting: Internal Medicine

## 2015-01-18 VITALS — BP 126/60 | HR 60 | Ht 67.25 in | Wt 121.5 lb

## 2015-01-18 DIAGNOSIS — Z7901 Long term (current) use of anticoagulants: Secondary | ICD-10-CM | POA: Diagnosis not present

## 2015-01-18 DIAGNOSIS — Z1211 Encounter for screening for malignant neoplasm of colon: Secondary | ICD-10-CM

## 2015-01-18 NOTE — Progress Notes (Signed)
Aaron Velez Oct 06, 1960 [id number]  Note: This dictation was prepared with Dragon digital system. Any transcriptional errors that result from this procedure are unintentional.   History of Present Illness: This is a 54 year old white male here to schedule screening colonoscopy. He has never had a colonoscopy. He has no GI symptoms. There is no family history of colon cancer. He is on Plavix for carotid disease as well as for coronary artery disease. He has been followed by Dr.Brabham and Dr. Aundra Dubin. He has 3 coronary stents, s/p  Right carotid  endarterectomy and subsequent carotid stent placement . He has been on disability. He continues to smoke. He used to do Architect work .    Past Medical History  Diagnosis Date  . CAD (coronary artery disease)   . HTN (hypertension)   . HLD (hyperlipidemia)   . Myocardial infarction 2001 & 2008  . Shortness of breath   . Carotid artery occlusion   . Anxiety     Past Surgical History  Procedure Laterality Date  . Carotid endarterectomy  12/05/2010    right CEA  . Coronary angioplasty with stent placement  2001,2007,2008  . Finger surgery      For brown recluse spider bite  . Percutaneous placement intravascular stent cervical carotid artery Right 01-2013  . Carotid angiogram Right 07/13/2012    Procedure: CAROTID ANGIOGRAM;  Surgeon: Serafina Mitchell, MD;  Location: [facility] CATH LAB;  Service: Cardiovascular;  Laterality: Right;  . Carotid stent insertion N/A 01/18/2013    Procedure: CAROTID STENT INSERTION;  Surgeon: Serafina Mitchell, MD;  Location: [facility] CATH LAB;  Service: Cardiovascular;  Laterality: N/A;    Allergies  Allergen Reactions  . Penicillins Rash    Burning pain and rash    Family history and social history have been reviewed.  Review of Systems: Continues to smoke. Denies chest pain or shortness of breath diarrhea constipation  The remainder of the 10 point ROS is negative except as outlined in the H&P  Physical  Exam: General Appearance: thin, in no distress, mass of smoke and cigarettes  Eyes  Non icteric  HEENT  Non traumatic, normocephalic  Mouth No lesion, tongue papillated, no cheilosis Neck Supple without adenopathy, thyroid not enlarged, bilateral carotid bruits  . Right post endarterectomy scar, no JVD Lungs Clear to auscultation bilaterally COR Normal S1, normal S2, regular rhythm, no murmur, quiet precordium Abdomen Soft nontender without organomegaly. Normoactive bowel sounds. No bruit  Rectal Not done  Extremities  No pedal edema Skin No lesions Neurological Alert and oriented x 3 Psychological Normal mood and affect  Assessment and Plan:   54 year old smoker with carotid artery disease as well as coronary artery disease. On long-term Plavix 75 mg daily. He is being closely monitored by Dr. Aundra Dubin and Dr. Trula Slade. He is due for a first screening colonoscopy. I will ask  Dr. Aundra Dubin if  Safe  to hold Plavix for 5 days prior to colonoscopy and possibly longer if the patient has polyps. If it is too risky for him to stop Plavix we may consider either virtual colonoscopy or proceeding with colonoscopy while on Plavix without polypectomy. I have discussed with the patient prep, the sedation as well as the procedure itself. He is interested in proceeding  Referred by Dr Bryan Lemma 01/18/2015

## 2015-01-18 NOTE — Patient Instructions (Addendum)
Dr Larose Kells, Dr Aundra Dubin, Dr Trula Slade  You have been scheduled for a colonoscopy. Please follow written instructions given to you at your visit today.  Please pick up your prep supplies at the pharmacy within the next 1-3 days. If you use inhalers (even only as needed), please bring them with you on the day of your procedure. Your physician has requested that you go to www.startemmi.com and enter the access code given to you at your visit today. This web site gives a general overview about your procedure. However, you should still follow specific instructions given to you by our office regarding your preparation for the procedure.  We will be contacting you regarding Plavix instructions once we hear from Dr Aundra Dubin.

## 2015-01-19 ENCOUNTER — Telehealth: Payer: Self-pay

## 2015-01-19 NOTE — Telephone Encounter (Signed)
Patient informed to stop plavix 5 days prior to procedure.  He will continue ASA.

## 2015-01-21 ENCOUNTER — Telehealth: Payer: Self-pay | Admitting: Internal Medicine

## 2015-01-21 DIAGNOSIS — R82998 Other abnormal findings in urine: Secondary | ICD-10-CM

## 2015-01-21 NOTE — Telephone Encounter (Signed)
Advise patient, needs to come back to leave a sample. UA, urine culture, dx -- leukocyturia

## 2015-01-22 NOTE — Telephone Encounter (Signed)
LMOM informing Pt to return call.  

## 2015-01-24 NOTE — Telephone Encounter (Signed)
Urine culture and UA ordered

## 2015-01-24 NOTE — Telephone Encounter (Signed)
Unable to contact Pt. Please advise.

## 2015-01-24 NOTE — Telephone Encounter (Signed)
Please send a letter with instructions

## 2015-01-24 NOTE — Telephone Encounter (Signed)
Letter printed and mailed to Pt.  

## 2015-01-27 ENCOUNTER — Emergency Department (HOSPITAL_COMMUNITY)
Admission: EM | Admit: 2015-01-27 | Discharge: 2015-01-27 | Disposition: A | Discharge status: Home / Self Care | Payer: Medicare Other | Attending: Emergency Medicine | Admitting: Emergency Medicine

## 2015-01-27 ENCOUNTER — Encounter (HOSPITAL_COMMUNITY): Payer: Self-pay | Admitting: *Deleted

## 2015-01-27 ENCOUNTER — Emergency Department (HOSPITAL_COMMUNITY): Payer: Medicare Other

## 2015-01-27 DIAGNOSIS — I10 Essential (primary) hypertension: Secondary | ICD-10-CM | POA: Diagnosis not present

## 2015-01-27 DIAGNOSIS — Z7982 Long term (current) use of aspirin: Secondary | ICD-10-CM | POA: Insufficient documentation

## 2015-01-27 DIAGNOSIS — S4992XA Unspecified injury of left shoulder and upper arm, initial encounter: Secondary | ICD-10-CM | POA: Insufficient documentation

## 2015-01-27 DIAGNOSIS — R202 Paresthesia of skin: Secondary | ICD-10-CM | POA: Insufficient documentation

## 2015-01-27 DIAGNOSIS — L02512 Cutaneous abscess of left hand: Secondary | ICD-10-CM | POA: Insufficient documentation

## 2015-01-27 DIAGNOSIS — Z7902 Long term (current) use of antithrombotics/antiplatelets: Secondary | ICD-10-CM | POA: Diagnosis not present

## 2015-01-27 DIAGNOSIS — Y9289 Other specified places as the place of occurrence of the external cause: Secondary | ICD-10-CM | POA: Insufficient documentation

## 2015-01-27 DIAGNOSIS — Y9389 Activity, other specified: Secondary | ICD-10-CM | POA: Insufficient documentation

## 2015-01-27 DIAGNOSIS — I252 Old myocardial infarction: Secondary | ICD-10-CM | POA: Diagnosis not present

## 2015-01-27 DIAGNOSIS — S6992XA Unspecified injury of left wrist, hand and finger(s), initial encounter: Secondary | ICD-10-CM | POA: Diagnosis present

## 2015-01-27 DIAGNOSIS — Z79899 Other long term (current) drug therapy: Secondary | ICD-10-CM | POA: Insufficient documentation

## 2015-01-27 DIAGNOSIS — Z9861 Coronary angioplasty status: Secondary | ICD-10-CM | POA: Diagnosis not present

## 2015-01-27 DIAGNOSIS — I2510 Atherosclerotic heart disease of native coronary artery without angina pectoris: Secondary | ICD-10-CM | POA: Diagnosis not present

## 2015-01-27 DIAGNOSIS — W278XXA Contact with other nonpowered hand tool, initial encounter: Secondary | ICD-10-CM | POA: Diagnosis not present

## 2015-01-27 DIAGNOSIS — Z880 Allergy status to penicillin: Secondary | ICD-10-CM | POA: Diagnosis not present

## 2015-01-27 DIAGNOSIS — E785 Hyperlipidemia, unspecified: Secondary | ICD-10-CM | POA: Diagnosis not present

## 2015-01-27 DIAGNOSIS — Z720 Tobacco use: Secondary | ICD-10-CM | POA: Insufficient documentation

## 2015-01-27 DIAGNOSIS — F419 Anxiety disorder, unspecified: Secondary | ICD-10-CM | POA: Insufficient documentation

## 2015-01-27 DIAGNOSIS — Y998 Other external cause status: Secondary | ICD-10-CM | POA: Insufficient documentation

## 2015-01-27 DIAGNOSIS — R531 Weakness: Secondary | ICD-10-CM | POA: Diagnosis not present

## 2015-01-27 MED ORDER — SULFAMETHOXAZOLE-TRIMETHOPRIM 800-160 MG PO TABS: 1.0000 | ORAL_TABLET | Freq: Two times a day (BID) | ORAL | Status: DC

## 2015-01-27 MED ORDER — PREDNISONE 20 MG PO TABS: 20.0000 mg | ORAL_TABLET | Freq: Two times a day (BID) | ORAL | Status: DC

## 2015-01-27 MED ORDER — LIDOCAINE-EPINEPHRINE (PF) 2 %-1:200000 IJ SOLN
INTRAMUSCULAR | Status: AC
  Filled 2015-01-27: qty 20

## 2015-01-27 MED ORDER — LIDOCAINE HCL 2 % IJ SOLN: 10.0000 mL | Freq: Once | INTRAMUSCULAR | Status: DC

## 2015-01-27 MED ORDER — LIDOCAINE-EPINEPHRINE (PF) 2 %-1:200000 IJ SOLN: 10.0000 mL | Freq: Once | INTRAMUSCULAR | Status: DC

## 2015-01-27 MED ORDER — LIDOCAINE HCL 2 % IJ SOLN
INTRAMUSCULAR | Status: AC
  Filled 2015-01-27: qty 20

## 2015-01-27 NOTE — ED Notes (Signed)
MD at bedside. 

## 2015-01-27 NOTE — ED Notes (Signed)
Patient transported to X-ray 

## 2015-01-27 NOTE — ED Provider Notes (Signed)
CSN: [id number]     Arrival date & time 01/27/15  0756 History   First MD Initiated Contact with Patient 01/27/15 0809     Chief Complaint  Patient presents with  . Finger Injury    left index     (Consider location/radiation/quality/duration/timing/severity/associated sxs/prior Treatment) HPI   Aaron Velez is a 54 y.o. male who complains of injury to left index finger 2 weeks ago when he was working at home. He accidentally punctured the skin of his left index finger with a screwdriver as he was attempting to insert a screw into a wall. He also complains of numbness of the left trapezius area that started yesterday, after sleeping wrong on his neck. 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 He denies fever, chills, nausea, vomiting, weakness or dizziness. There are no other known modifying factors.   Past Medical History  Diagnosis Date  . CAD (coronary artery disease)   . HTN (hypertension)   . HLD (hyperlipidemia)   . Myocardial infarction 2001 & 2008  . Shortness of breath   . Carotid artery occlusion   . Anxiety    Past Surgical History  Procedure Laterality Date  . Carotid endarterectomy  12/05/2010    right CEA  . Coronary angioplasty with stent placement  2001,2007,2008  . Finger surgery      For brown recluse spider bite  . Percutaneous placement intravascular stent cervical carotid artery Right 01-2013  . Carotid angiogram Right 07/13/2012    Procedure: CAROTID ANGIOGRAM;  Surgeon: Serafina Mitchell, MD;  Location: [facility] CATH LAB;  Service: Cardiovascular;  Laterality: Right;  . Carotid stent insertion N/A 01/18/2013    Procedure: CAROTID STENT INSERTION;  Surgeon: Serafina Mitchell, MD;  Location: [facility] CATH LAB;  Service: Cardiovascular;  Laterality: N/A;   Family History  Problem Relation Age of Onset  . Hypertension Maternal Uncle   . Coronary artery disease Maternal Uncle   . Cancer - Colon Neg Hx   . Prostate cancer Neg Hx   . Diabetes Neg Hx   .  Hyperlipidemia Mother   . Esophageal cancer Neg Hx   . Kidney disease Neg Hx   . Gallbladder disease Neg Hx    History  Substance Use Topics  . Smoking status: Current Every Day Smoker -- 1.00 packs/day for 40 years    Types: Cigarettes  . Smokeless tobacco: Former Systems developer    Types: [place] date: 09/08/1978     Comment: 1 to 1.5 PPD  . Alcohol Use: 0.0 oz/week    0 Standard drinks or equivalent per week     Comment: socially     Review of Systems  All other systems reviewed and are negative.     Allergies  Penicillins  Home Medications   Prior to Admission medications   Medication Sig Start Date End Date Taking? Authorizing Provider  aspirin 81 MG tablet Take 81 mg by mouth daily.      Historical Provider, MD  atorvastatin (LIPITOR) 40 MG tablet take 1 tablet by mouth once daily 10/09/14   Larey Dresser, MD  carvedilol (COREG) 6.25 MG tablet Take 1 tablet (6.25 mg total) by mouth 2 (two) times daily. 12/22/14   Colon Branch, MD  clopidogrel (PLAVIX) 75 MG tablet take 1 tablet by mouth once daily 10/09/14   Larey Dresser, MD  diazepam (VALIUM) 5 MG tablet TAKE 1 TABLET EVERY 12 HOURS AS NEEDED FOR ANXIETY. 11/14/14  Colon Branch, MD  escitalopram (LEXAPRO) 10 MG tablet Take 1 tablet (10 mg total) by mouth daily. 12/22/14   Colon Branch, MD  nitroGLYCERIN (NITROSTAT) 0.4 MG SL tablet Place 1 tablet (0.4 mg total) under the tongue every 5 (five) minutes as needed for chest pain. 09/12/13   Larey Dresser, MD  ramipril (ALTACE) 2.5 MG capsule Take 1 capsule (2.5 mg total) by mouth daily. 12/22/14   Colon Branch, MD   BP 123/77 mmHg  Pulse 78  Temp(Src) 97.9 F (36.6 C) (Oral)  Resp 17  Ht 5\' 7"  (1.702 m)  Wt 121 lb (54.885 kg)  BMI 18.95 kg/m2  SpO2 100% Physical Exam  Constitutional: He is oriented to person, place, and time. He appears well-developed and well-nourished.  HENT:  Head: Normocephalic and atraumatic.  Right Ear: External ear normal.  Left Ear: External ear  normal.  Eyes: Conjunctivae and EOM are normal. Pupils are equal, round, and reactive to light.  Neck: Normal range of motion and phonation normal. Neck supple.  Cardiovascular: Normal rate.   Pulmonary/Chest: Effort normal. He exhibits no bony tenderness.  Musculoskeletal: Normal range of motion.  Mild tenderness left trapezius. Atrophied left shoulder girdle. Left finger two with localized swelling and fluctuance at the radial aspect of the PIP joint. Neurovascular intact distally. Moderate swelling of the left index finger. No proximal streaking. No left elbow epitrochlear nodes  Neurological: He is alert and oriented to person, place, and time. No cranial nerve deficit or sensory deficit. He exhibits normal muscle tone. Coordination normal.  Skin: Skin is warm, dry and intact.  Psychiatric: He has a normal mood and affect. His behavior is normal. Judgment and thought content normal.  Nursing note and vitals reviewed.   ED Course  Procedures (including critical care time)  Medications  lidocaine (XYLOCAINE) 2 % (with pres) injection 200 mg (not administered)  lidocaine (XYLOCAINE) 2 % (with pres) injection (not administered)    Patient Vitals for the past 24 hrs:  BP Temp Temp src Pulse Resp SpO2 Height Weight  01/27/15 0807 - - - - - - 5\' 7"  (1.702 m) 121 lb (54.885 kg)  01/27/15 0800 123/77 mmHg 97.9 F (36.6 C) Oral 78 17 100 % - -   NERVE BLOCK Performed by: Richarda Blade Consent: Verbal consent obtained. Required items: required blood products, implants, devices, and special equipment available Time out: Immediately prior to procedure a "time out" was called to verify the correct patient, procedure, equipment, support staff and site/side marked as required.  Indication:Digital Block for finger abscess Nerve block body site: left finger 2  Preparation: Patient was prepped and draped in the usual sterile fashion. Needle gauge: 24 G Location technique: anatomical  landmarks  Local anesthetic: 2 % Xylocaine   Anesthetic total: 4 ml  Outcome: pain improved Patient tolerance: Patient tolerated the procedure well with no immediate complications.   INCISION AND DRAINAGE Performed by: Richarda Blade Consent: Verbal consent obtained. Risks and benefits: risks, benefits and alternatives were discussed Type: abscess  Body area: Left finger 2  Anesthesia: local infiltration  Incision was made with a scalpel.  Complexity: complex  Blunt dissection to break up loculations  Drainage amount: None     Patient tolerance: Patient tolerated the procedure well with no immediate complications.    10:02 AM Reevaluation with update and discussion. After initial assessment and treatment, an updated evaluation reveals he is comfortable at this time. Mathhew Buysse L     Labs Review  Labs Reviewed - No data to display  Imaging Review No results found.   EKG Interpretation None      MDM   Final diagnoses:  Paresthesia  Abscess of finger, left    Wound infection without significant drainage, after incision. There is no evidence for a sending infection or septic arthritis. Un-associated paresthesia left trapezius area, likely related to nerve impingement.  Nursing Notes Reviewed/ Care Coordinated Applicable Imaging Reviewed Interpretation of Laboratory Data incorporated into ED treatment  The patient appears reasonably screened and/or stabilized for discharge and I doubt any other medical condition or other [facility] requiring further screening, evaluation, or treatment in the ED at this time prior to discharge.  Plan: Home Medications- Prednosone; Home Treatments- warm soaks finger, heating pad, shoulder; return here if the recommended treatment, does not improve the symptoms; Recommended follow up- PCP as needed   Daleen Bo, MD 01/27/15 1004

## 2015-01-27 NOTE — ED Notes (Addendum)
Patient states "a couple weeks ago," he injured his left index finger when the drill he was using slipped and bit into his left lateral index finger.  Patient presents with localized swelling and hematoma to left index proximal phalangeal joint.  Patient has limited ROM to digit, but can move it. Patient also c/o left neck pain and numbness radiating to left shoulder which he first noticed yesterday morning upon awakening.  Patient states nothing makes neck or finger pain worse/better.  Patient took Diazepam to treat neck pain with limited relief.  Patient has taken no other medications to treat current illness.  Patient believes his last tetanus was >10 years ago.

## 2015-01-27 NOTE — Discharge Instructions (Signed)
Soak the finger in warm water 3 times a day as needed, and tell the wound heals. Use a heating pad on her left shoulder for the numbness. Return here if needed, for problems.   Abscess An abscess is an infected area that contains a collection of pus and debris.It can occur in almost any part of the body. An abscess is also known as a furuncle or boil. CAUSES  An abscess occurs when tissue gets infected. This can occur from blockage of oil or sweat glands, infection of hair follicles, or a minor injury to the skin. As the body tries to fight the infection, pus collects in the area and creates pressure under the skin. This pressure causes pain. People with weakened immune systems have difficulty fighting infections and get certain abscesses more often.  SYMPTOMS Usually an abscess develops on the skin and becomes a painful mass that is red, warm, and tender. If the abscess forms under the skin, you may feel a moveable soft area under the skin. Some abscesses break open (rupture) on their own, but most will continue to get worse without care. The infection can spread deeper into the body and eventually into the bloodstream, causing you to feel ill.  DIAGNOSIS  Your caregiver will take your medical history and perform a physical exam. A sample of fluid may also be taken from the abscess to determine what is causing your infection. TREATMENT  Your caregiver may prescribe antibiotic medicines to fight the infection. However, taking antibiotics alone usually does not cure an abscess. Your caregiver may need to make a small cut (incision) in the abscess to drain the pus. In some cases, gauze is packed into the abscess to reduce pain and to continue draining the area. HOME CARE INSTRUCTIONS   Only take over-the-counter or prescription medicines for pain, discomfort, or fever as directed by your caregiver.  If you were prescribed antibiotics, take them as directed. Finish them even if you start to feel  better.  If gauze is used, follow your caregiver's directions for changing the gauze.  To avoid spreading the infection:  Keep your draining abscess covered with a bandage.  Wash your hands well.  Do not share personal care items, towels, or whirlpools with others.  Avoid skin contact with others.  Keep your skin and clothes clean around the abscess.  Keep all follow-up appointments as directed by your caregiver. SEEK MEDICAL CARE IF:   You have increased pain, swelling, redness, fluid drainage, or bleeding.  You have muscle aches, chills, or a general ill feeling.  You have a fever. MAKE SURE YOU:   Understand these instructions.  Will watch your condition.  Will get help right away if you are not doing well or get worse. Document Released: 06/04/2005 Document Revised: 02/24/2012 Document Reviewed: 11/07/2011 [facility] Patient Information 2015 [place], [place]. This information is not intended to replace advice given to you by your health care provider. Make sure you discuss any questions you have with your health care provider.  Incision and Drainage Incision and drainage is a procedure in which a sac-like structure (cystic structure) is opened and drained. The area to be drained usually contains material such as pus, fluid, or blood.  LET YOUR CAREGIVER KNOW ABOUT:   Allergies to medicine.  Medicines taken, including vitamins, herbs, eyedrops, over-the-counter medicines, and creams.  Use of steroids (by mouth or creams).  Previous problems with anesthetics or numbing medicines.  History of bleeding problems or blood clots.  Previous surgery.  Other health problems, including diabetes and kidney problems.  Possibility of pregnancy, if this applies. RISKS AND COMPLICATIONS  Pain.  Bleeding.  Scarring.  Infection. BEFORE THE PROCEDURE  You may need to have an ultrasound or other imaging tests to see how large or deep your cystic structure is. Blood tests  may also be used to determine if you have an infection or how severe the infection is. You may need to have a tetanus shot. PROCEDURE  The affected area is cleaned with a cleaning fluid. The cyst area will then be numbed with a medicine (local anesthetic). A small incision will be made in the cystic structure. A syringe or catheter may be used to drain the contents of the cystic structure, or the contents may be squeezed out. The area will then be flushed with a cleansing solution. After cleansing the area, it is often gently packed with a gauze or another wound dressing. Once it is packed, it will be covered with gauze and tape or some other type of wound dressing. AFTER THE PROCEDURE   Often, you will be allowed to go home right after the procedure.  You may be given antibiotic medicine to prevent or heal an infection.  If the area was packed with gauze or some other wound dressing, you will likely need to come back in 1 to 2 days to get it removed.  The area should heal in about 14 days. Document Released: 02/18/2001 Document Revised: 02/24/2012 Document Reviewed: 10/20/2011 [facility] Patient Information 2015 [place], [place]. This information is not intended to replace advice given to you by your health care provider. Make sure you discuss any questions you have with your health care provider.  Paresthesia Paresthesia is an abnormal burning or prickling sensation. This sensation is generally felt in the hands, arms, legs, or feet. However, it may occur in any part of the body. It is usually not painful. The feeling may be described as:  Tingling or numbness.  "Pins and needles."  Skin crawling.  Buzzing.  Limbs "falling asleep."  Itching. Most people experience temporary (transient) paresthesia at some time in their lives. CAUSES  Paresthesia may occur when you breathe too quickly (hyperventilation). It can also occur without any apparent cause. Commonly, paresthesia occurs when  pressure is placed on a nerve. The feeling quickly goes away once the pressure is removed. For some people, however, paresthesia is a long-lasting (chronic) condition caused by an underlying disorder. The underlying disorder may be:  A traumatic, direct injury to nerves. Examples include a:  Broken (fractured) neck.  Fractured skull.  A disorder affecting the brain and spinal cord (central nervous system). Examples include:  Transverse myelitis.  Encephalitis.  Transient ischemic attack.  Multiple sclerosis.  Stroke.  Tumor or blood vessel problems, such as an arteriovenous malformation pressing against the brain or spinal cord.  A condition that damages the peripheral nerves (peripheral neuropathy). Peripheral nerves are not part of the brain and spinal cord. These conditions include:  Diabetes.  Peripheral vascular disease.  Nerve entrapment syndromes, such as carpal tunnel syndrome.  Shingles.  Hypothyroidism.  Vitamin B12 deficiencies.  Alcoholism.  Heavy metal poisoning (lead, arsenic).  Rheumatoid arthritis.  Systemic lupus erythematosus. DIAGNOSIS  Your caregiver will attempt to find the underlying cause of your paresthesia. Your caregiver may:  Take your medical history.  Perform a physical exam.  Order various lab tests.  Order imaging tests. TREATMENT  Treatment for paresthesia depends on the underlying cause. HOME CARE INSTRUCTIONS  Avoid drinking alcohol.  You may consider massage or acupuncture to help relieve your symptoms.  Keep all follow-up appointments as directed by your caregiver. SEEK IMMEDIATE MEDICAL CARE IF:   You feel weak.  You have trouble walking or moving.  You have problems with speech or vision.  You feel confused.  You cannot control your bladder or bowel movements.  You feel numbness after an injury.  You faint.  Your burning or prickling feeling gets worse when walking.  You have pain, cramps, or  dizziness.  You develop a rash. MAKE SURE YOU:  Understand these instructions.  Will watch your condition.  Will get help right away if you are not doing well or get worse. Document Released: 08/15/2002 Document Revised: 11/17/2011 Document Reviewed: 05/16/2011 [facility] Patient Information 2015 [place], [place]. This information is not intended to replace advice given to you by your health care provider. Make sure you discuss any questions you have with your health care provider.

## 2015-02-16 ENCOUNTER — Telehealth: Payer: Self-pay | Admitting: Internal Medicine

## 2015-02-16 NOTE — Telephone Encounter (Signed)
Pre Visit letter sent  °

## 2015-02-27 ENCOUNTER — Ambulatory Visit: Payer: Medicare Other | Admitting: Internal Medicine

## 2015-03-02 ENCOUNTER — Other Ambulatory Visit: Payer: Self-pay | Admitting: Internal Medicine

## 2015-03-05 NOTE — Telephone Encounter (Signed)
Okay #60, no refills. Also advised patient he is due for a checkup

## 2015-03-05 NOTE — Telephone Encounter (Signed)
Rx faxed to [facility] pharmacy.  

## 2015-03-05 NOTE — Telephone Encounter (Signed)
Pt is requesting refill on Diazepam.  Last OV: 12/22/2014 Last Fill: 11/14/2014 #[id number] UDS: 04/25/2014 Low risk  Please advise.

## 2015-03-05 NOTE — Telephone Encounter (Signed)
Rx printed, awaiting MD signature. Pt has CPE scheduled for 03/09/2015

## 2015-03-08 ENCOUNTER — Other Ambulatory Visit: Payer: Self-pay

## 2015-03-08 ENCOUNTER — Encounter: Payer: Self-pay | Admitting: *Deleted

## 2015-03-08 ENCOUNTER — Telehealth: Payer: Self-pay | Admitting: *Deleted

## 2015-03-08 NOTE — Telephone Encounter (Signed)
Unable to reach patient at time of Pre-Visit Call.  Left message for patient to return call when available.    

## 2015-03-09 ENCOUNTER — Ambulatory Visit (INDEPENDENT_AMBULATORY_CARE_PROVIDER_SITE_OTHER): Payer: Medicare Other | Admitting: Internal Medicine

## 2015-03-09 ENCOUNTER — Encounter: Payer: Self-pay | Admitting: Internal Medicine

## 2015-03-09 VITALS — BP 108/76 | HR 67 | Temp 98.2°F | Ht 67.0 in | Wt 119.4 lb

## 2015-03-09 DIAGNOSIS — Z0000 Encounter for general adult medical examination without abnormal findings: Secondary | ICD-10-CM | POA: Diagnosis not present

## 2015-03-09 DIAGNOSIS — Z23 Encounter for immunization: Secondary | ICD-10-CM

## 2015-03-09 DIAGNOSIS — F419 Anxiety disorder, unspecified: Secondary | ICD-10-CM

## 2015-03-09 DIAGNOSIS — R634 Abnormal weight loss: Secondary | ICD-10-CM | POA: Diagnosis not present

## 2015-03-09 LAB — COMPREHENSIVE METABOLIC PANEL
ALT: 13 U/L (ref 0–53)
AST: 17 U/L (ref 0–37)
Albumin: 3.9 g/dL (ref 3.5–5.2)
Alkaline Phosphatase: 59 U/L (ref 39–117)
BUN: 13 mg/dL (ref 6–23)
CO2: 28 meq/L (ref 19–32)
Calcium: 9.4 mg/dL (ref 8.4–10.5)
Chloride: 102 mEq/L (ref 96–112)
Creatinine, Ser: 0.93 mg/dL (ref 0.40–1.50)
GFR: 89.34 mL/min (ref >60.00)
GLUCOSE: 80 mg/dL (ref 70–99)
POTASSIUM: 4.7 meq/L (ref 3.5–5.1)
Sodium: 136 mEq/L (ref 135–145)
Total Bilirubin: 0.5 mg/dL (ref 0.2–1.2)
Total Protein: 6.7 g/dL (ref 6.0–8.3)

## 2015-03-09 LAB — FOLATE: FOLATE: 13.5 ng/mL (ref >5.9)

## 2015-03-09 LAB — SEDIMENTATION RATE: Sed Rate: 7 mm/hr (ref 0–22)

## 2015-03-09 LAB — VITAMIN B12: VITAMIN B 12: 311 pg/mL (ref 211–911)

## 2015-03-09 NOTE — Progress Notes (Signed)
Pre visit review using our clinic review tool, if applicable. No additional management support is needed unless otherwise documented below in the visit note. 

## 2015-03-09 NOTE — Patient Instructions (Addendum)
Labs today

## 2015-03-09 NOTE — Assessment & Plan Note (Addendum)
Td 2015 PNM shot - 2015 Prevnar 02-2015  CCS--Colonoscopy pending DRE  PSA :   normal 2015 Diet--rec a healthy, nutritious diet exercise discussed  Plans to tobacco abuse, currently 2 packs a day, plans to quit, to use a patch starting in few days.    Labs: He is not fasting today, will check cholesterol when he comes back. We'll do J28, folic acid, CMP, HIV, sedimentation rate and vitamin D.

## 2015-03-09 NOTE — Progress Notes (Signed)
Subjective:    Patient ID: Aaron Velez, male    DOB: 08/04/1961, 54 y.o.   MRN: [id number]  DOS:  03/09/2015 Type of visit - description : Complete physical exam Interval history: States in general he feels great.   Review of Systems  Constitutional: No fever. No chills.  No unusual or nocturnal sweats. He continued to lose weight, states that happens every summer because he does not eat as much.  HEENT: No dental problems, no ear discharge, no facial swelling, no voice changes. No eye discharge, no eye  redness , no  intolerance to light   Respiratory: No wheezing , no  difficulty breathing. Minimal, sporadic cough without hemoptysis  Cardiovascular: No CP, no leg swelling , no  Palpitations  GI: no nausea, no vomiting, no diarrhea , no  abdominal pain.  No blood in the stools. No dysphagia, no odynophagia    Endocrine: No polyphagia, no polyuria , no polydipsia  GU: No dysuria, gross hematuria, difficulty urinating. No urinary urgency, no frequency.  Musculoskeletal: Long history of right shoulder pain, that is a ongoing problem, slightly worse lately.  Skin: No change in the color of the skin, palor , no  Rash  Allergic, immunologic: No environmental allergies , no  food allergies  Neurological: No dizziness no  syncope. No headaches. No diplopia, no slurred, no slurred speech, no motor deficits, no facial  Numbness  Hematological: No enlarged lymph nodes, no easy bruising , no unusual bleedings  Psychiatry: No suicidal ideas, no hallucinations, no beavior problems, no confusion.  Stays emotionally he is doing "very good", "I'm very happy , everything is okay"   Past Medical History  Diagnosis Date  . CAD (coronary artery disease)   . HTN (hypertension)   . HLD (hyperlipidemia)   . Myocardial infarction 2001 & 2008  . Shortness of breath   . Carotid artery occlusion   . Anxiety     Past Surgical History  Procedure Laterality Date  . Carotid endarterectomy   12/05/2010    right CEA  . Coronary angioplasty with stent placement  2001,2007,2008  . Finger surgery      For brown recluse spider bite  . Percutaneous placement intravascular stent cervical carotid artery Right 01-2013  . Carotid angiogram Right 07/13/2012    Procedure: CAROTID ANGIOGRAM;  Surgeon: Serafina Mitchell, MD;  Location: [facility] CATH LAB;  Service: Cardiovascular;  Laterality: Right;  . Carotid stent insertion N/A 01/18/2013    Procedure: CAROTID STENT INSERTION;  Surgeon: Serafina Mitchell, MD;  Location: [facility] CATH LAB;  Service: Cardiovascular;  Laterality: N/A;    History   Social History  . Marital Status: Married    Spouse Name: N/A  . Number of Children: 4  . Years of Education: N/A   Occupational History  . retired Animal nutritionist    Social History Main Topics  . Smoking status: Current Every Day Smoker -- 1.00 packs/day for 40 years    Types: Cigarettes  . Smokeless tobacco: Former Systems developer    Types: [place] date: 09/08/1978     Comment: 2 PPD  . Alcohol Use: 0.0 oz/week    0 Standard drinks or equivalent per week     Comment: socially   . Drug Use: No  . Sexual Activity: Not on file   Other Topics Concern  . Not on file   Social History Narrative   HS Diploma.   Household-- wife moved out 09-2014,  2 sons, has not talked to them in a while    2 daughters      Family History  Problem Relation Age of Onset  . Hypertension Maternal Uncle   . Coronary artery disease Maternal Uncle   . Cancer - Colon Neg Hx   . Prostate cancer Neg Hx   . Diabetes Neg Hx   . Hyperlipidemia Mother   . Esophageal cancer Neg Hx   . Kidney disease Neg Hx   . Gallbladder disease Neg Hx        Medication List       This list is accurate as of: 03/09/15 11:59 PM.  Always use your most recent med list.               aspirin 81 MG tablet  Take 81 mg by mouth daily.     atorvastatin 40 MG tablet  Commonly known as:  LIPITOR  take 1 tablet by mouth once daily      carvedilol 6.25 MG tablet  Commonly known as:  COREG  Take 1 tablet (6.25 mg total) by mouth 2 (two) times daily.     clopidogrel 75 MG tablet  Commonly known as:  PLAVIX  take 1 tablet by mouth once daily     diazepam 5 MG tablet  Commonly known as:  VALIUM  Take 1 tablet (5 mg total) by mouth every 12 (twelve) hours as needed for anxiety.     escitalopram 10 MG tablet  Commonly known as:  LEXAPRO  Take 1 tablet (10 mg total) by mouth daily.     nitroGLYCERIN 0.4 MG SL tablet  Commonly known as:  NITROSTAT  Place 1 tablet (0.4 mg total) under the tongue every 5 (five) minutes as needed for chest pain.     ramipril 2.5 MG capsule  Commonly known as:  ALTACE  Take 1 capsule (2.5 mg total) by mouth daily.           Objective:   Physical Exam BP 108/76 mmHg  Pulse 67  Temp(Src) 98.2 F (36.8 C) (Oral)  Ht 5\' 7"  (1.702 m)  Wt 119 lb 6 oz (54.148 kg)  BMI 18.69 kg/m2  SpO2 96% General:   Well developed, slightly under weight appearing, no apparent physical distress  Neck:  Full range of motion. Supple. No  Thyromegaly  HEENT:  Normocephalic . Face symmetric, atraumatic Lungs:  CTA B Normal respiratory effort, no intercostal retractions, no accessory muscle use. Heart: RRR,  no murmur.  No pretibial edema bilaterally  Abdomen:  Not distended, soft, non-tender. No rebound or rigidity. No mass,organomegaly Skin: Exposed areas without rash. Not pale. Not jaundice Neurologic:  alert & oriented X3.  Speech normal, gait appropriate for age and unassisted Strength symmetric and appropriate for age.  Psych: Cognition and judgment appear intact.  Cooperative with normal attention span and concentration.  Behavior appropriate. Slightly "hyper" but no anxious or depressed appearing.       Assessment & Plan:

## 2015-03-10 LAB — HIV ANTIBODY (ROUTINE TESTING W REFLEX): HIV 1&2 Ab, 4th Generation: NONREACTIVE

## 2015-03-12 LAB — VITAMIN D 1,25 DIHYDROXY
Vitamin D 1, 25 (OH)2 Total: 41 pg/mL (ref 18–72)
Vitamin D2 1, 25 (OH)2: <8 pg/mL
Vitamin D3 1, 25 (OH)2: 41 pg/mL

## 2015-03-12 NOTE — Assessment & Plan Note (Signed)
Ongoing problem,  anxiety and depression well controlled per pt Labs reviewed: No anemia or hyperthyroidism, normal chest x-ray 01-2015. He is a heavy smoker. Reports no headache or unusual aches or other than right shoulder pain from old injury. Plan:  Sedimentation rate, vitamins Return to clinic in 2 months, if weight loss persists we'll get CT chest and abdomen.

## 2015-03-12 NOTE — Assessment & Plan Note (Addendum)
on Lexapro, Valium as needed. He sees Mrs Jhonnie Garner, a counselor and states that he is helping quite a bit.  He is convinced anxiety is under excellent control , I'm not sure so asked him again but he still maintains is doing great

## 2015-03-19 ENCOUNTER — Ambulatory Visit: Payer: Medicare Other | Admitting: Surgery

## 2015-03-19 ENCOUNTER — Other Ambulatory Visit (HOSPITAL_COMMUNITY): Payer: Medicare Other

## 2015-03-26 ENCOUNTER — Emergency Department (HOSPITAL_COMMUNITY)
Admission: EM | Admit: 2015-03-26 | Discharge: 2015-03-26 | Disposition: A | Discharge status: Home / Self Care | Payer: Medicare Other | Attending: Emergency Medicine | Admitting: Emergency Medicine

## 2015-03-26 ENCOUNTER — Encounter (HOSPITAL_COMMUNITY): Payer: Self-pay | Admitting: Emergency Medicine

## 2015-03-26 DIAGNOSIS — I252 Old myocardial infarction: Secondary | ICD-10-CM | POA: Insufficient documentation

## 2015-03-26 DIAGNOSIS — Z7982 Long term (current) use of aspirin: Secondary | ICD-10-CM | POA: Diagnosis not present

## 2015-03-26 DIAGNOSIS — Z720 Tobacco use: Secondary | ICD-10-CM | POA: Insufficient documentation

## 2015-03-26 DIAGNOSIS — Z8659 Personal history of other mental and behavioral disorders: Secondary | ICD-10-CM | POA: Diagnosis not present

## 2015-03-26 DIAGNOSIS — Z9889 Other specified postprocedural states: Secondary | ICD-10-CM | POA: Insufficient documentation

## 2015-03-26 DIAGNOSIS — Z9861 Coronary angioplasty status: Secondary | ICD-10-CM | POA: Diagnosis not present

## 2015-03-26 DIAGNOSIS — E785 Hyperlipidemia, unspecified: Secondary | ICD-10-CM | POA: Insufficient documentation

## 2015-03-26 DIAGNOSIS — Z7902 Long term (current) use of antithrombotics/antiplatelets: Secondary | ICD-10-CM | POA: Insufficient documentation

## 2015-03-26 DIAGNOSIS — I10 Essential (primary) hypertension: Secondary | ICD-10-CM | POA: Insufficient documentation

## 2015-03-26 DIAGNOSIS — Z880 Allergy status to penicillin: Secondary | ICD-10-CM | POA: Diagnosis not present

## 2015-03-26 DIAGNOSIS — R42 Dizziness and giddiness: Secondary | ICD-10-CM | POA: Insufficient documentation

## 2015-03-26 DIAGNOSIS — I2510 Atherosclerotic heart disease of native coronary artery without angina pectoris: Secondary | ICD-10-CM | POA: Insufficient documentation

## 2015-03-26 DIAGNOSIS — Z79899 Other long term (current) drug therapy: Secondary | ICD-10-CM | POA: Insufficient documentation

## 2015-03-26 LAB — COMPREHENSIVE METABOLIC PANEL
ALT: 17 U/L (ref 17–63)
ANION GAP: 4 — AB (ref 5–15)
AST: 20 U/L (ref 15–41)
Albumin: 3.6 g/dL (ref 3.5–5.0)
Alkaline Phosphatase: 48 U/L (ref 38–126)
BILIRUBIN TOTAL: 0.6 mg/dL (ref 0.3–1.2)
BUN: 11 mg/dL (ref 6–20)
CO2: 31 mmol/L (ref 22–32)
CREATININE: 0.71 mg/dL (ref 0.61–1.24)
Calcium: 9.1 mg/dL (ref 8.9–10.3)
Chloride: 106 mmol/L (ref 101–111)
GFR calc Af Amer: >60 mL/min (ref >60)
GFR calc non Af Amer: >60 mL/min (ref >60)
GLUCOSE: 110 mg/dL — AB (ref 65–99)
POTASSIUM: 4.5 mmol/L (ref 3.5–5.1)
SODIUM: 141 mmol/L (ref 135–145)
Total Protein: 6.2 g/dL — ABNORMAL LOW (ref 6.5–8.1)

## 2015-03-26 LAB — URINALYSIS, ROUTINE W REFLEX MICROSCOPIC
BILIRUBIN URINE: NEGATIVE
GLUCOSE, UA: NEGATIVE mg/dL
Hgb urine dipstick: NEGATIVE
Ketones, ur: NEGATIVE mg/dL
Leukocytes, UA: NEGATIVE
Nitrite: NEGATIVE
PROTEIN: NEGATIVE mg/dL
Specific Gravity, Urine: 1.019 (ref 1.005–1.030)
Urobilinogen, UA: 1 mg/dL (ref 0.0–1.0)
pH: 7 (ref 5.0–8.0)

## 2015-03-26 LAB — CBC
HCT: 43.9 % (ref 39.0–52.0)
Hemoglobin: 14.4 g/dL (ref 13.0–17.0)
MCH: 28.7 pg (ref 26.0–34.0)
MCHC: 32.8 g/dL (ref 30.0–36.0)
MCV: 87.6 fL (ref 78.0–100.0)
Platelets: 216 10*3/uL (ref 150–400)
RBC: 5.01 MIL/uL (ref 4.22–5.81)
RDW: 15.4 % (ref 11.5–15.5)
WBC: 7.8 10*3/uL (ref 4.0–10.5)

## 2015-03-26 LAB — TROPONIN I: Troponin I: <0.03 ng/mL (ref <0.031)

## 2015-03-26 NOTE — Discharge Instructions (Signed)

## 2015-03-26 NOTE — ED Provider Notes (Signed)
CSN: [id number]     Arrival date & time 03/26/15  1343 History   First MD Initiated Contact with Patient 03/26/15 1501     Chief Complaint  Patient presents with  . Dizziness     (Consider location/radiation/quality/duration/timing/severity/associated sxs/prior Treatment) HPI Comments: The patient is a 54 year old male, he has a history of hypercholesterolemia, hypertension and prior history of coronary disease status post cardiac stenting in 2001, 2007 and 2008 after a stent collapsed. He has also had a carotid endarterectomy on the right, has a known blockage on the left which is being followed by his cardiologist. He presents with approximately one week of feeling lightheaded when he stands up, states this occurs mostly in the morning and in the evening, it is not associated with chest pain or shortness of breath but it is associated with a heaviness in the back of his neck. He has pain in his right shoulder and right arm. He does have chronic pain in his shoulder and arm because of biceps and rotator cuff tears. He states that the symptoms in his neck and his arm have been going on for years and are not new. He describes the dizziness as lightheadedness when he stands. He had one episode of vertigo but most of the time it is feeling lightheaded and off balance which last one or 2 minutes, resolved by itself and is always related to a change in position. It never occurs when he is at rest or in the supine position. There has been no exertional symptoms including chest pain or shortness of breath, no palpitations, no swelling of the legs and no fever, chills, vomiting, diarrhea, dysuria, swelling, rashes, visual changes, speech changes, facial droop. He does have nausea when the lightheadedness occurs. At this time the patient is asymptomatic  Patient is a 54 y.o. male presenting with dizziness. The history is provided by the patient.  Dizziness   Past Medical History  Diagnosis Date  . CAD  (coronary artery disease)   . HTN (hypertension)   . HLD (hyperlipidemia)   . Myocardial infarction 2001 & 2008  . Shortness of breath   . Carotid artery occlusion   . Anxiety    Past Surgical History  Procedure Laterality Date  . Carotid endarterectomy  12/05/2010    right CEA  . Coronary angioplasty with stent placement  2001,2007,2008  . Finger surgery      For brown recluse spider bite  . Percutaneous placement intravascular stent cervical carotid artery Right 01-2013  . Carotid angiogram Right 07/13/2012    Procedure: CAROTID ANGIOGRAM;  Surgeon: Serafina Mitchell, MD;  Location: [facility] CATH LAB;  Service: Cardiovascular;  Laterality: Right;  . Carotid stent insertion N/A 01/18/2013    Procedure: CAROTID STENT INSERTION;  Surgeon: Serafina Mitchell, MD;  Location: [facility] CATH LAB;  Service: Cardiovascular;  Laterality: N/A;   Family History  Problem Relation Age of Onset  . Hypertension Maternal Uncle   . Coronary artery disease Maternal Uncle   . Cancer - Colon Neg Hx   . Prostate cancer Neg Hx   . Diabetes Neg Hx   . Hyperlipidemia Mother   . Esophageal cancer Neg Hx   . Kidney disease Neg Hx   . Gallbladder disease Neg Hx    History  Substance Use Topics  . Smoking status: Current Every Day Smoker -- 1.00 packs/day for 40 years    Types: Cigarettes  . Smokeless tobacco: Former Systems developer    Types: Loss adjuster, chartered  Quit date: 09/08/1978     Comment: 2 PPD  . Alcohol Use: 0.0 oz/week    0 Standard drinks or equivalent per week     Comment: socially     Review of Systems  Neurological: Positive for dizziness.  All other systems reviewed and are negative.     Allergies  Penicillins  Home Medications   Prior to Admission medications   Medication Sig Start Date End Date Taking? Authorizing Provider  aspirin 81 MG tablet Take 81 mg by mouth daily.     Yes Historical Provider, MD  atorvastatin (LIPITOR) 40 MG tablet take 1 tablet by mouth once daily 10/09/14  Yes Larey Dresser, MD   carvedilol (COREG) 6.25 MG tablet Take 1 tablet (6.25 mg total) by mouth 2 (two) times daily. 12/22/14  Yes Colon Branch, MD  clopidogrel (PLAVIX) 75 MG tablet take 1 tablet by mouth once daily 10/09/14  Yes Larey Dresser, MD  diazepam (VALIUM) 5 MG tablet Take 1 tablet (5 mg total) by mouth every 12 (twelve) hours as needed for anxiety. 03/05/15  Yes Colon Branch, MD  escitalopram (LEXAPRO) 10 MG tablet Take 1 tablet (10 mg total) by mouth daily. 12/22/14  Yes Colon Branch, MD  Menthol, Topical Analgesic, (BENGAY EX) Apply 1 application topically once.   Yes Historical Provider, MD  ramipril (ALTACE) 2.5 MG capsule Take 1 capsule (2.5 mg total) by mouth daily. 12/22/14  Yes Colon Branch, MD  nitroGLYCERIN (NITROSTAT) 0.4 MG SL tablet Place 1 tablet (0.4 mg total) under the tongue every 5 (five) minutes as needed for chest pain. Patient not taking: Reported on 03/08/2015 09/12/13   Larey Dresser, MD   BP 98/63 mmHg  Pulse 58  Temp(Src) 98.1 F (36.7 C) (Oral)  Resp 14  SpO2 99% Physical Exam  Constitutional: He appears well-developed and well-nourished. No distress.  HENT:  Head: Normocephalic and atraumatic.  Mouth/Throat: Oropharynx is clear and moist. No oropharyngeal exudate.  Eyes: Conjunctivae and EOM are normal. Pupils are equal, round, and reactive to light. Right eye exhibits no discharge. Left eye exhibits no discharge. No scleral icterus.  Neck: Normal range of motion. Neck supple. No JVD present. No thyromegaly present.  Cardiovascular: Normal rate, regular rhythm, normal heart sounds and intact distal pulses.  Exam reveals no gallop and no friction rub.   No murmur heard. Left carotid artery with a soft bruit  Pulmonary/Chest: Effort normal and breath sounds normal. No respiratory distress. He has no wheezes. He has no rales.  Abdominal: Soft. Bowel sounds are normal. He exhibits no distension and no mass. There is no tenderness.  Musculoskeletal: Normal range of motion. He exhibits no  edema or tenderness.  Lymphadenopathy:    He has no cervical adenopathy.  Neurological: He is alert. Coordination normal.  Speech is clear, cranial nerves III through XII are intact, memory is intact, strength is normal in all 4 extremities including grips, sensation is intact to light touch and pinprick in all 4 extremities. Coordination as tested by finger-nose-finger is normal, no limb ataxia. Normal gait, normal reflexes at the patellar tendons bilaterally  Skin: Skin is warm and dry. No rash noted. No erythema.  Psychiatric: He has a normal mood and affect. His behavior is normal.  Nursing note and vitals reviewed.   ED Course  Procedures (including critical care time) Labs Review Labs Reviewed  COMPREHENSIVE METABOLIC PANEL - Abnormal; Notable for the following:    Glucose, Bld 110 (*)  Total Protein 6.2 (*)    Anion gap 4 (*)    All other components within normal limits  URINALYSIS, ROUTINE W REFLEX MICROSCOPIC (NOT AT [facility]) - Abnormal; Notable for the following:    APPearance CLOUDY (*)    All other components within normal limits  CBC  TROPONIN I    Imaging Review No results found.   EKG Interpretation   Date/Time:  Monday March 26 2015 15:26:06 EDT Ventricular Rate:  66 PR Interval:  127 QRS Duration: 75 QT Interval:  386 QTC Calculation: 404 R Axis:   79 Text Interpretation:  Sinus rhythm Minimal ST elevation, anterior leads  Lateral leads are also involved Since last tracing ST abnormalities are  more pronounced. Confirmed by Sabra Heck  MD, Staves ([id number]) on 03/26/2015  3:34:26 PM      MDM   Final diagnoses:  Light headed    The patient has a normal exam except for a soft carotid bruit which she was aware of. He does not have exertional symptoms, it is purely positional making a cardiac source much less likely. We'll check an EKG and basic labs to the patient states that he just had basic labs drawn at his doctor's office 2 weeks ago at his yearly physical.  He is getting ready for a colonoscopy on Thursday. There has been no fevers or vomiting, his vital signs are rather unremarkable except for mild hypotension.  D/w Wannetta Sender with cards - states that ECG unchanged and Dr. Irish Lack has seen and agreed.  Ambulatory without difficulty, vital signs totally unremarkable except for borderline blood pressure 98/63. Troponin negative, labs otherwise negative, urinalysis clean, discussed with patient, he requests discharge as he has other things he has to do today.  Noemi Chapel, MD 03/26/15 1758

## 2015-03-26 NOTE — ED Notes (Signed)
Pt states that for about a week he has been having dizziness when he changes positions as well as when he turns his neck he will also get dizzy.  Pt states that he got dizzy that he had to side step.

## 2015-03-29 ENCOUNTER — Encounter: Payer: Self-pay | Admitting: Surgery

## 2015-03-29 ENCOUNTER — Encounter: Payer: Self-pay | Admitting: Internal Medicine

## 2015-03-29 ENCOUNTER — Ambulatory Visit (AMBULATORY_SURGERY_CENTER): Payer: Medicare Other | Admitting: Internal Medicine

## 2015-03-29 VITALS — BP 129/58 | HR 53 | Temp 97.2°F | Resp 18 | Ht 67.25 in | Wt 121.0 lb

## 2015-03-29 DIAGNOSIS — K635 Polyp of colon: Secondary | ICD-10-CM

## 2015-03-29 DIAGNOSIS — D125 Benign neoplasm of sigmoid colon: Secondary | ICD-10-CM | POA: Diagnosis not present

## 2015-03-29 DIAGNOSIS — D128 Benign neoplasm of rectum: Secondary | ICD-10-CM

## 2015-03-29 DIAGNOSIS — Z1211 Encounter for screening for malignant neoplasm of colon: Secondary | ICD-10-CM

## 2015-03-29 DIAGNOSIS — D129 Benign neoplasm of anus and anal canal: Secondary | ICD-10-CM

## 2015-03-29 MED ORDER — SODIUM CHLORIDE 0.9 % IV SOLN: 500.0000 mL | INTRAVENOUS | Status: DC

## 2015-03-29 NOTE — Patient Instructions (Signed)
YOU HAD AN ENDOSCOPIC PROCEDURE TODAY AT [facility]:   Refer to the procedure report that was given to you for any specific questions about what was found during the examination.  If the procedure report does not answer your questions, please call your gastroenterologist to clarify.  If you requested that your care partner not be given the details of your procedure findings, then the procedure report has been included in a sealed envelope for you to review at your convenience later.  YOU SHOULD EXPECT: Some feelings of bloating in the abdomen. Passage of more gas than usual.  Walking can help get rid of the air that was put into your GI tract during the procedure and reduce the bloating. If you had a lower endoscopy (such as a colonoscopy or flexible sigmoidoscopy) you may notice spotting of blood in your stool or on the toilet paper. If you underwent a bowel prep for your procedure, you may not have a normal bowel movement for a few days.  Please Note:  You might notice some irritation and congestion in your nose or some drainage.  This is from the oxygen used during your procedure.  There is no need for concern and it should clear up in a day or so.  SYMPTOMS TO REPORT IMMEDIATELY:   Following lower endoscopy (colonoscopy or flexible sigmoidoscopy):  Excessive amounts of blood in the stool  Significant tenderness or worsening of abdominal pains  Swelling of the abdomen that is new, acute  Fever of 100F or higher  For urgent or emergent issues, a gastroenterologist can be reached at any hour by calling [phone].   DIET: Your first meal following the procedure should be a small meal and then it is ok to progress to your normal diet. Heavy or fried foods are harder to digest and may make you feel nauseous or bloated.  Likewise, meals heavy in dairy and vegetables can increase bloating.  Drink plenty of fluids but you should avoid alcoholic beverages for 24  hours.  ACTIVITY:  You should plan to take it easy for the rest of today and you should NOT DRIVE or use heavy machinery until tomorrow (because of the sedation medicines used during the test).    FOLLOW UP: Our staff will call the number listed on your records the next business day following your procedure to check on you and address any questions or concerns that you may have regarding the information given to you following your procedure. If we do not reach you, we will leave a message.  However, if you are feeling well and you are not experiencing any problems, there is no need to return our call.  We will assume that you have returned to your regular daily activities without incident.  If any biopsies were taken you will be contacted by phone or by letter within the next 1-3 weeks.  Please call us at [phone] if you have not heard about the biopsies in 3 weeks.    SIGNATURES/CONFIDENTIALITY: You and/or your care partner have signed paperwork which will be entered into your electronic medical record.  These signatures attest to the fact that that the information above on your After Visit Summary has been reviewed and is understood.  Full responsibility of the confidentiality of this discharge information lies with you and/or your care-partner.  Polyp and high fiber diet information given.  Hold Plavix for two more weeks-until August 4.

## 2015-03-29 NOTE — Progress Notes (Signed)
Called to room to assist during endoscopic procedure.  Patient ID and intended procedure confirmed with present staff. Received instructions for my participation in the procedure from the performing physician.  

## 2015-03-29 NOTE — Op Note (Signed)
[facility]  [address] [place], [address]   COLONOSCOPY PROCEDURE REPORT  PATIENT: Aaron Velez, Aaron Velez  MR#: [id number] BIRTHDATE: July 03, 1961 , 62  yrs. old GENDER: male ENDOSCOPIST: Lafayette Dragon, MD REFERRED AS:[facility] Larose Kells, M.D. PROCEDURE DATE:  03/29/2015 PROCEDURE:   Colonoscopy, screening, Colonoscopy with cold biopsy polypectomy, and Colonoscopy with snare polypectomy First Screening Colonoscopy - Avg.  risk and is 50 yrs.  old or older Yes.  Prior Negative Screening - Now for repeat screening. N/A  History of Adenoma - Now for follow-up colonoscopy & has been > or = to 3 yrs.  N/A  Polyps removed today? Yes ASA CLASS:   Class III INDICATIONS:Screening for colonic neoplasia and Colorectal Neoplasm Risk Assessment for this procedure is average risk. MEDICATIONS: Propofol 250 mg IV  DESCRIPTION OF PROCEDURE:   After the risks benefits and alternatives of the procedure were thoroughly explained, informed consent was obtained.  The digital rectal exam revealed no abnormalities of the rectum.   The LB [id number]  endoscope was introduced through the anus and advanced to the cecum, which was identified by both the appendix and ileocecal valve. No adverse events experienced.   The quality of the prep was good.  (MoviPrep was used)  The instrument was then slowly withdrawn as the colon was fully examined. Estimated blood loss is zero unless otherwise noted in this procedure report.      COLON FINDINGS: Four polypoid shaped sessile polyps measuring 9 mm in size were found in the sigmoid colon and rectum.  A polypectomy was performed with cold forceps.  The resection was complete, the polyp tissue was completely retrieved and sent to histology.  A polypectomy was performed with a cold snare.  The resection was complete, the polyp tissue was completely retrieved and sent to histology.  Retroflexed views revealed no abnormalities. The time to cecum = 8.37  Withdrawal time = 8.48   The scope was withdrawn and the procedure completed. COMPLICATIONS: There were no immediate complications.  ENDOSCOPIC IMPRESSION: Four sessile polyps 6-9 mm were found in the sigmoid colon and rectum; polypectomy was performed with cold forceps x2; polypectomy was performed with a cold snare x2  RECOMMENDATIONS: 1.  Await pathology results 2.  Hold Plavix for 2 weeks Recall colonoscopy pending path report High fiber diet  eSigned:  Lafayette Dragon, MD 03/29/2015 8:04 AM   cc:   PATIENT NAME:  Tavari, Loadholt MR#: [id number]

## 2015-03-29 NOTE — Progress Notes (Signed)
Patient awakening,vss,report to rn 

## 2015-03-30 ENCOUNTER — Telehealth: Payer: Self-pay | Admitting: *Deleted

## 2015-03-30 NOTE — Telephone Encounter (Signed)
No answer. Number identifier. Message left to call if questions or concerns. 

## 2015-04-02 ENCOUNTER — Ambulatory Visit (INDEPENDENT_AMBULATORY_CARE_PROVIDER_SITE_OTHER): Payer: Medicare Other | Admitting: Surgery

## 2015-04-02 ENCOUNTER — Other Ambulatory Visit: Payer: Self-pay | Admitting: Surgery

## 2015-04-02 ENCOUNTER — Ambulatory Visit (INDEPENDENT_AMBULATORY_CARE_PROVIDER_SITE_OTHER)
Admission: RE | Admit: 2015-04-02 | Discharge: 2015-04-02 | Disposition: A | Discharge status: Home / Self Care | Payer: Medicare Other | Source: Ambulatory Visit | Attending: Surgery | Admitting: Surgery

## 2015-04-02 ENCOUNTER — Encounter: Payer: Self-pay | Admitting: Surgery

## 2015-04-02 ENCOUNTER — Ambulatory Visit (HOSPITAL_COMMUNITY)
Admission: RE | Admit: 2015-04-02 | Discharge: 2015-04-02 | Disposition: A | Discharge status: Home / Self Care | Payer: Medicare Other | Source: Ambulatory Visit | Attending: Surgery | Admitting: Surgery

## 2015-04-02 VITALS — BP 93/59 | HR 61 | Temp 98.1°F | Resp 16 | Ht 70.0 in | Wt 122.0 lb

## 2015-04-02 DIAGNOSIS — I739 Peripheral vascular disease, unspecified: Secondary | ICD-10-CM

## 2015-04-02 DIAGNOSIS — Z48812 Encounter for surgical aftercare following surgery on the circulatory system: Secondary | ICD-10-CM

## 2015-04-02 DIAGNOSIS — I6523 Occlusion and stenosis of bilateral carotid arteries: Secondary | ICD-10-CM | POA: Diagnosis not present

## 2015-04-02 DIAGNOSIS — I770 Arteriovenous fistula, acquired: Secondary | ICD-10-CM

## 2015-04-02 DIAGNOSIS — F17210 Nicotine dependence, cigarettes, uncomplicated: Secondary | ICD-10-CM | POA: Insufficient documentation

## 2015-04-02 NOTE — Progress Notes (Signed)
HISTORY AND PHYSICAL     CC:  Check up 6 month carotid/PVD Referring Provider:  Colon Branch, MD  HPI: This is a 54 y.o. male who is s/p right carotid endarterectomy 12/05/10 and subsequent right carotid stenting 01/18/13.  The pt returns today for follow up.  He denies any amaurosis fugax, hemiparesis or difficulty speaking.  He states that he is doing quite well without any symptoms.    He is on a statin for hypercholesterolemia.  He is on a beta blocker for his hypertension.  He is on a daily aspirin.  2  Past Medical History  Diagnosis Date  . CAD (coronary artery disease)   . HTN (hypertension)   . HLD (hyperlipidemia)   . Myocardial infarction 2001 & 2008  . Shortness of breath   . Carotid artery occlusion   . Anxiety     Past Surgical History  Procedure Laterality Date  . Carotid endarterectomy  12/05/2010    right CEA  . Coronary angioplasty with stent placement  2001,2007,2008  . Finger surgery      For brown recluse spider bite  . Percutaneous placement intravascular stent cervical carotid artery Right 01-2013  . Carotid angiogram Right 07/13/2012    Procedure: CAROTID ANGIOGRAM;  Surgeon: Serafina Mitchell, MD;  Location: [facility] CATH LAB;  Service: Cardiovascular;  Laterality: Right;  . Carotid stent insertion N/A 01/18/2013    Procedure: CAROTID STENT INSERTION;  Surgeon: Serafina Mitchell, MD;  Location: [facility] CATH LAB;  Service: Cardiovascular;  Laterality: N/A;    Allergies  Allergen Reactions  . Penicillins Rash    Burning pain and rash    Current Outpatient Prescriptions  Medication Sig Dispense Refill  . aspirin 81 MG tablet Take 81 mg by mouth daily.      Marland Kitchen atorvastatin (LIPITOR) 40 MG tablet take 1 tablet by mouth once daily 30 tablet 6  . carvedilol (COREG) 6.25 MG tablet Take 1 tablet (6.25 mg total) by mouth 2 (two) times daily. 60 tablet 5  . clopidogrel (PLAVIX) 75 MG tablet take 1 tablet by mouth once daily 30 tablet 6  . diazepam (VALIUM) 5 MG tablet Take 1  tablet (5 mg total) by mouth every 12 (twelve) hours as needed for anxiety. 60 tablet 0  . escitalopram (LEXAPRO) 10 MG tablet Take 1 tablet (10 mg total) by mouth daily. 30 tablet 5  . Menthol, Topical Analgesic, (BENGAY EX) Apply 1 application topically once.    . nitroGLYCERIN (NITROSTAT) 0.4 MG SL tablet Place 1 tablet (0.4 mg total) under the tongue every 5 (five) minutes as needed for chest pain. 25 tablet 3  . ramipril (ALTACE) 2.5 MG capsule Take 1 capsule (2.5 mg total) by mouth daily. 30 capsule 5   No current facility-administered medications for this visit.    Family History  Problem Relation Age of Onset  . Hypertension Maternal Uncle   . Coronary artery disease Maternal Uncle   . Cancer - Colon Neg Hx   . Prostate cancer Neg Hx   . Diabetes Neg Hx   . Esophageal cancer Neg Hx   . Kidney disease Neg Hx   . Gallbladder disease Neg Hx   . Colon cancer Neg Hx   . Hyperlipidemia Mother     History   Social History  . Marital Status: Married    Spouse Name: N/A  . Number of Children: 4  . Years of Education: N/A   Occupational History  . retired Animal nutritionist  Social History Main Topics  . Smoking status: Current Every Day Smoker -- 1.00 packs/day for 40 years    Types: Cigarettes  . Smokeless tobacco: Former Systems developer    Types: [place] date: 09/08/1978     Comment: 2 PPD  . Alcohol Use: 0.0 oz/week    0 Standard drinks or equivalent per week     Comment: socially   . Drug Use: Yes  . Sexual Activity: Not on file   Other Topics Concern  . Not on file   Social History Narrative   HS Diploma.   Household-- wife moved out 09-2014,    2 sons, has not talked to them in a while    2 daughters      ROS: [x]  Positive   [ ]  Negative   [ ]  All sytems reviewed and are negative  Cardiovascular: []  chest pain/pressure []  palpitations []  SOB lying flat []  DOE []  pain in legs while walking []  pain in feet when lying flat []  hx of DVT []  hx of  phlebitis []  swelling in legs []  varicose veins  Pulmonary: []  productive cough []  asthma []  wheezing  Neurologic: []  weakness in []  arms []  legs []  numbness in []  arms []  legs [] difficulty speaking or slurred speech []  temporary loss of vision in one eye []  dizziness  Hematologic: []  bleeding problems []  problems with blood clotting easily  GI []  vomiting blood []  blood in stool  GU: []  burning with urination []  blood in urine  Psychiatric: []  hx of major depression  Integumentary: []  rashes []  ulcers  Constitutional: []  fever []  chills   PHYSICAL EXAMINATION:  Filed Vitals:   04/02/15 1419  BP: 93/59  Pulse: 61  Temp: 98.1 F (36.7 C)  Resp: 16   Body mass index is 17.51 kg/(m^2).  General:  WDWN in NAD Gait: Not observed HENT: WNL, normocephalic Pulmonary: normal non-labored breathing , without Rales, rhonchi,  wheezing Cardiac: RRR, without  Murmurs, rubs or gallops; without carotid bruits Abdomen: soft, NT, no masses Skin: without rashes, without ulcers  Vascular Exam/Pulses:  Right Left  Radial 2+ (normal) 2+ (normal)  Femoral 2+ (normal) - there is a + bruit present right groin 2+ (normal)  DP 2+ (normal) 2+ (normal)  PT 2+ (normal) 2+ (normal)   Extremities: without ischemic changes, without Gangrene , without cellulitis; without open wounds;  Musculoskeletal: no muscle wasting or atrophy  Neurologic: A&O X 3; Appropriate Affect ; SENSATION: normal; MOTOR FUNCTION:  moving all extremities equally. Speech is fluent/normal   Non-Invasive Vascular Imaging:   Carotid duplex 04/02/15: 1.  Widely patent right carotid endarterectomy and CCA stent without evidence for restenosis. 2.  Elevated velocity with post stenosis turbulence observed in the proximal left CCA/subclavian artery, bony structures limit visibility 3.  Less than 40% left ICA stenosis   Lower extremity arterial duplex 04/02/15: 1.  No evidence of right lower extremity arterial  occlusive disease 2.  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   Beta Blocker:  Yes.   Aspirin:  Yes.   ACEI:  Yes.   ARB:  No. Other Antiplatelet/Anticoagulant:  Yes.   Plavix   ASSESSMENT/PLAN:: 54 y.o. male who is s/p right carotid artery endarterectomy 12/05/10 with subsequent stent placed in the right common carotid artery 01/18/13 and AV fistula right groin    -pt doing well from carotid standpoint.  His right CE site is widely patent and  there is no evidence of restenosis.   There is a stenosis is the proximal left carotid artery that is difficult to assess with ultrasound and will need CT scan in a year to reassess this area.  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  He has had multiple catheterizations in the past and uncertain when this occurred.  The pt continues to remain asymptomatic.  Continue to monitor.  His lower arterial duplex today revealed no evidence of arterial occlusive disease and he does have palpable pedal pulses bilaterally. -pt states that he may be moving to [place], [place].  He has been informed to make sure he puts in a request for his medical records and he does have a complicated medical hx.  -we will schedule his testing here, but can be cancelled if he moves before then.   Leontine Locket, PA-C [facility] [phone]  Clinic MD:  Pt seen and examined in conjunction with Dr. Trula Slade   I agree with the above I have seen and evaluated the patient.The patient is back today for followup. He is status post right carotid stent on 01/18/2013. He has previously undergone right carotid endarterectomy on 12/05/2010.  He has no complaints today.   He tells me he is getting ready to move to [place]. If he remains in the area L see him back in 6 months for  follow-up carotid Doppler study and in one year with a duplex of his leg to evaluate his AV fistula. At some point, the proximal left common carotid stenosis needs to be reevaluated.  It cannot be evaluated with ultrasound as visualization is limited due to its location. His last arteriogram  Into thousand 13revealed this area to be approximately 65%   Annamarie Major

## 2015-04-02 NOTE — Patient Instructions (Signed)
Peripheral Vascular Disease Peripheral Vascular Disease (PVD), also called Peripheral Arterial Disease (PAD), is a circulation problem caused by cholesterol (atherosclerotic plaque) deposits in the arteries. PVD commonly occurs in the lower extremities (legs) but it can occur in other areas of the body, such as your arms. The cholesterol buildup in the arteries reduces blood flow which can cause pain and other serious problems. The presence of PVD can place a person at risk for Coronary Artery Disease (CAD).  CAUSES  Causes of PVD can be many. It is usually associated with more than one risk factor such as:   High Cholesterol.  Smoking.  Diabetes.  Lack of exercise or inactivity.  High blood pressure (hypertension).  Obesity.  Family history. SYMPTOMS   When the lower extremities are affected, patients with PVD may experience:  Leg pain with exertion or physical activity. This is called INTERMITTENT CLAUDICATION. This may present as cramping or numbness with physical activity. The location of the pain is associated with the level of blockage. For example, blockage at the abdominal level (distal abdominal aorta) may result in buttock or hip pain. Lower leg arterial blockage may result in calf pain.  As PVD becomes more severe, pain can develop with less physical activity.  In people with severe PVD, leg pain may occur at rest.  Other PVD signs and symptoms:  Leg numbness or weakness.  Coldness in the affected leg or foot, especially when compared to the other leg.  A change in leg color.  Patients with significant PVD are more prone to ulcers or sores on toes, feet or legs. These may take longer to heal or may reoccur. The ulcers or sores can become infected.  If signs and symptoms of PVD are ignored, gangrene may occur. This can result in the loss of toes or loss of an entire limb.  Not all leg pain is related to PVD. Other medical conditions can cause leg pain such  as:  Blood clots (embolism) or Deep Vein Thrombosis.  Inflammation of the blood vessels (vasculitis).  Spinal stenosis. DIAGNOSIS  Diagnosis of PVD can involve several different types of tests. These can include:  Pulse Volume Recording Method (PVR). This test is simple, painless and does not involve the use of X-rays. PVR involves measuring and comparing the blood pressure in the arms and legs. An ABI (Ankle-Brachial Index) is calculated. The normal ratio of blood pressures is 1. As this number becomes smaller, it indicates more severe disease.  < 0.95 - indicates significant narrowing in one or more leg vessels.  <0.8 - there will usually be pain in the foot, leg or buttock with exercise.  <0.4 - will usually have pain in the legs at rest.  <0.25 - usually indicates limb threatening PVD.  Doppler detection of pulses in the legs. This test is painless and checks to see if you have a pulses in your legs/feet.  A dye or contrast material (a substance that highlights the blood vessels so they show up on x-ray) may be given to help your caregiver better see the arteries for the following tests. The dye is eliminated from your body by the kidney's. Your caregiver may order blood work to check your kidney function and other laboratory values before the following tests are performed:  Magnetic Resonance Angiography (MRA). An MRA is a picture study of the blood vessels and arteries. The MRA machine uses a large magnet to produce images of the blood vessels.  Computed Tomography Angiography (CTA). A CTA   is a specialized x-ray that looks at how the blood flows in your blood vessels. An IV may be inserted into your arm so contrast dye can be injected.  Angiogram. Is a procedure that uses x-rays to look at your blood vessels. This procedure is minimally invasive, meaning a small incision (cut) is made in your groin. A small tube (catheter) is then inserted into the artery of your groin. The catheter  is guided to the blood vessel or artery your caregiver wants to examine. Contrast dye is injected into the catheter. X-rays are then taken of the blood vessel or artery. After the images are obtained, the catheter is taken out. TREATMENT  Treatment of PVD involves many interventions which may include:  Lifestyle changes:  Quitting smoking.  Exercise.  Following a low fat, low cholesterol diet.  Control of diabetes.  Foot care is very important to the PVD patient. Good foot care can help prevent infection.  Medication:  Cholesterol-lowering medicine.  Blood pressure medicine.  Anti-platelet drugs.  Certain medicines may reduce symptoms of Intermittent Claudication.  Interventional/Surgical options:  Angioplasty. An Angioplasty is a procedure that inflates a balloon in the blocked artery. This opens the blocked artery to improve blood flow.  Stent Implant. A wire mesh tube (stent) is placed in the artery. The stent expands and stays in place, allowing the artery to remain open.  Peripheral Bypass Surgery. This is a surgical procedure that reroutes the blood around a blocked artery to help improve blood flow. This type of procedure may be performed if Angioplasty or stent implants are not an option. SEEK IMMEDIATE MEDICAL CARE IF:   You develop pain or numbness in your arms or legs.  Your arm or leg turns cold, becomes blue in color.  You develop redness, warmth, swelling and pain in your arms or legs. MAKE SURE YOU:   Understand these instructions.  Will watch your condition.  Will get help right away if you are not doing well or get worse. Document Released: 10/02/2004 Document Revised: 11/17/2011 Document Reviewed: 08/29/2008 [facility] Patient Information 2015 [place], [place]. This information is not intended to replace advice given to you by your health care provider. Make sure you discuss any questions you have with your health care provider. Carotid Artery  Disease The carotid arteries are the two main arteries on either side of the neck that supply blood to the brain. Carotid artery disease, also called carotid artery stenosis, is the narrowing or blockage of one or both carotid arteries. Carotid artery disease increases your risk for a stroke or a transient ischemic attack (TIA). A TIA is an episode in which a waxy, fatty substance that accumulates within the artery (plaque) blocks blood flow to the brain. A TIA is considered a "warning stroke."  CAUSES   Buildup of plaque inside the carotid arteries (atherosclerosis) (common).  A weakened outpouching in an artery (aneurysm).  Inflammation of the carotid artery (arteritis).  A fibrous growth within the carotid artery (fibromuscular dysplasia).  Tissue death within the carotid artery due to radiation treatment (post-radiation necrosis).  Decreased blood flow due to spasms of the carotid artery (vasospasm).  Separation of the walls of the carotid artery (carotid dissection). RISK FACTORS  High cholesterol (dyslipidemia).   High blood pressure (hypertension).   Smoking.   Obesity.   Diabetes.   Family history of cardiovascular disease.   Inactivity or lack of regular exercise.   Being male. Men have an increased risk of developing atherosclerosis earlier in life  than women.  SYMPTOMS  Carotid artery disease does not cause symptoms. DIAGNOSIS Diagnosis of carotid artery disease may include:   A physical exam. Your health care provider may hear an abnormal sound (bruit) when listening to the carotid arteries.   Specific tests that look at the blood flow in the carotid arteries. These tests include:   Carotid artery ultrasonography.   Carotid or cerebral angiography.   Computerized tomographic angiography (CTA).   Magnetic resonance angiography (MRA).  TREATMENT  Treatment of carotid artery disease can include a combination of treatments. Treatment options  include:  Surgery. You may have:   A carotid endarterectomy. This is a surgery to remove the blockages in the carotid arteries.   A carotid angioplasty with stenting. This is a nonsurgical interventional procedure. A wire mesh (stent) is used to widen the blocked carotid arteries.   Medicines to control blood pressure, cholesterol, and reduce blood clotting (antiplatelet therapy).   Adjusting your diet.   Lifestyle changes such as:   Quitting smoking.   Exercising as tolerated or as directed by your health care provider.   Controlling and maintaining a good blood pressure.   Keeping cholesterol levels under control.  HOME CARE INSTRUCTIONS   Take medicines only as directed by your health care provider. Make sure you understand all your medicine instructions. Do not stop your medicines without talking to your health care provider.   Follow your health care provider's diet instructions. It is important to eat a healthy diet that is low in saturated fats and includes plenty of fresh fruits, vegetables, and lean meats. High-fat, high-sodium foods as well as foods that are fried, overly processed, or have poor nutritional value should be avoided.  Maintain a healthy weight.   Stay physically active. It is recommended that you get at least 30 minutes of activity every day.   Do not use any tobacco products including cigarettes, chewing tobacco, or electronic cigarettes. If you need help quitting, ask your health care provider.  Limit alcohol use to:   No more than 2 drinks per day for men.   No more than 1 drink per day for nonpregnant women.   Do not use illegal drugs.   Keep all follow-up visits as directed by your health care provider.  SEEK IMMEDIATE MEDICAL CARE IF:  You develop TIA or stroke symptoms. These include:   Sudden weakness or numbness on one side of the body, such as in the face, arm, or leg.   Sudden confusion.   Trouble speaking  (aphasia) or understanding.   Sudden trouble seeing out of one or both eyes.   Sudden trouble walking.   Dizziness or feeling like you might faint.   Loss of balance or coordination.   Sudden severe headache with no known cause.   Sudden trouble swallowing (dysphagia).  If you have any of these symptoms, call your local emergency services (911 in [place] Do not drive yourself to the clinic or hospital. This is a medical emergency.  Document Released: 11/17/2011 Document Revised: 01/09/2014 Document Reviewed: 02/23/2013 [facility] Patient Information 2015 [place], [place]. This information is not intended to replace advice given to you by your health care provider. Make sure you discuss any questions you have with your health care provider.

## 2015-04-05 ENCOUNTER — Encounter: Payer: Self-pay | Admitting: Internal Medicine

## 2015-05-05 IMAGING — CR DG HIP (WITH OR WITHOUT PELVIS) 2-3V*L*
3 series · 3 of 3 positions shown · non-contrast
Comparison: None.

CLINICAL DATA: Left-sided hip pain radiating down the leg since 4
p.m. this afternoon. Swelling to the left buttock. No injury.

EXAM:
LEFT HIP (WITH PELVIS) 2-3 VIEWS

[t pelvis ap]
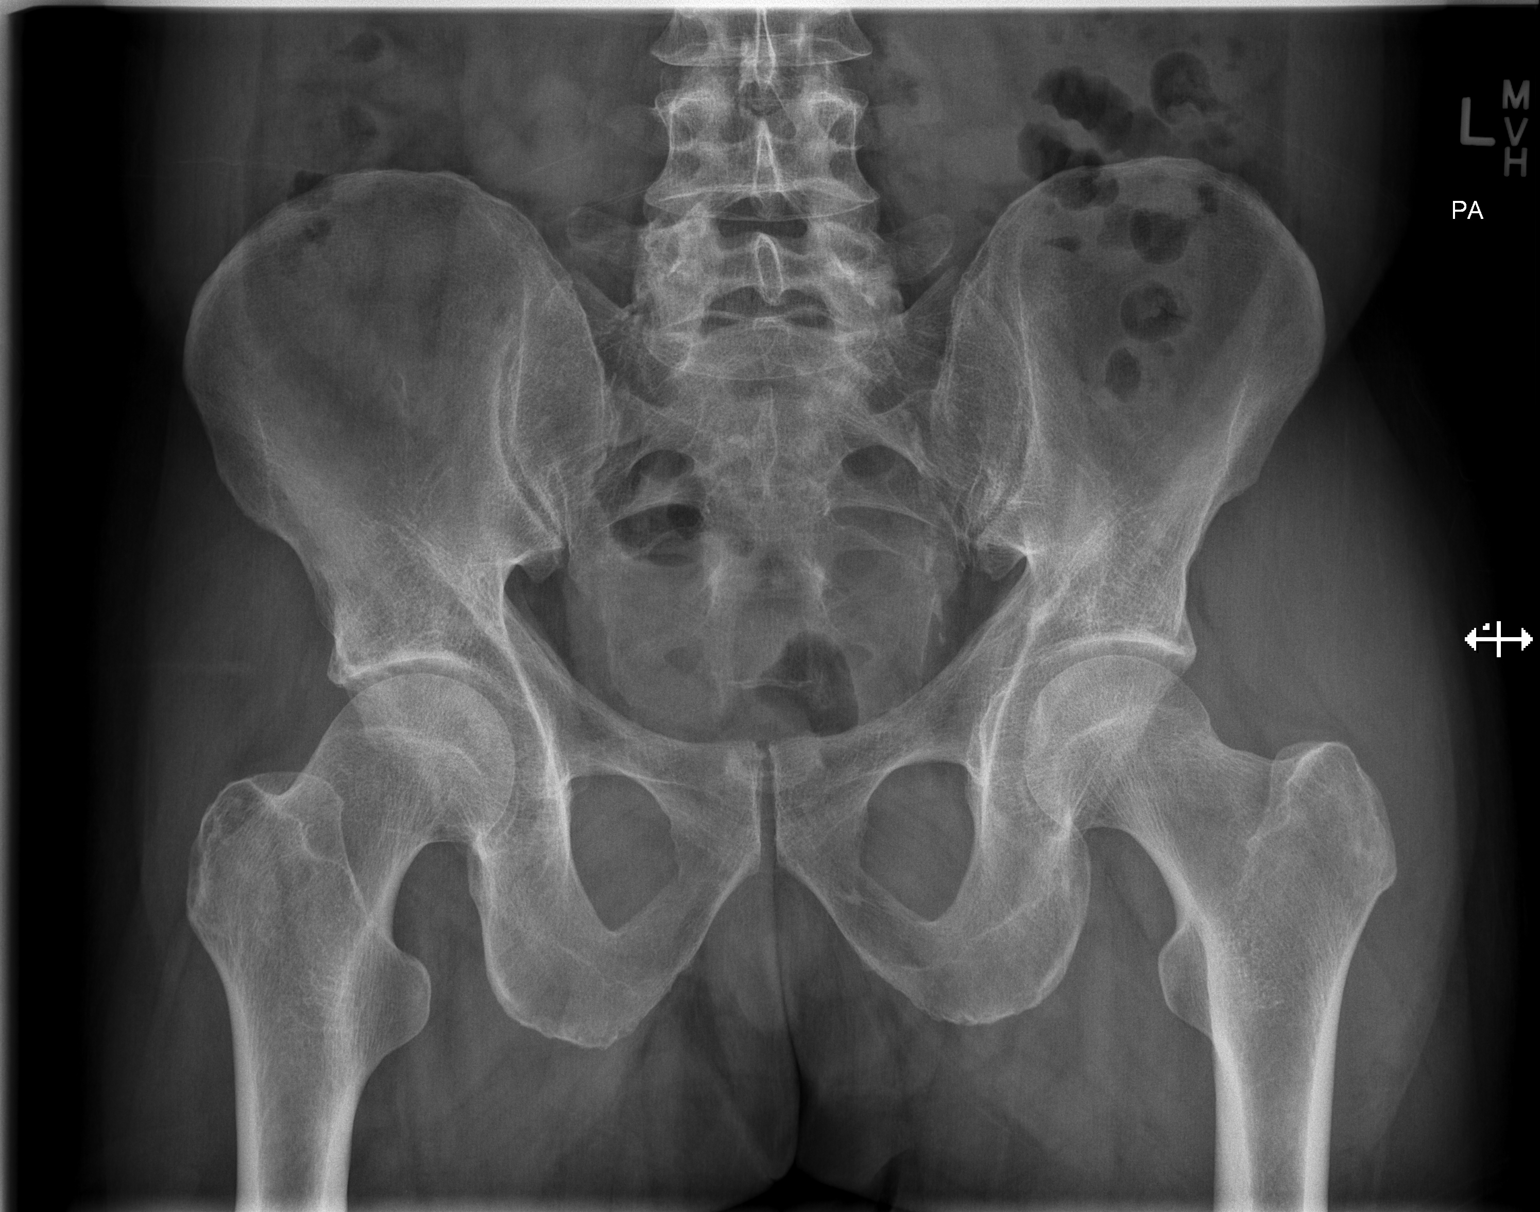

[t hip ap left]
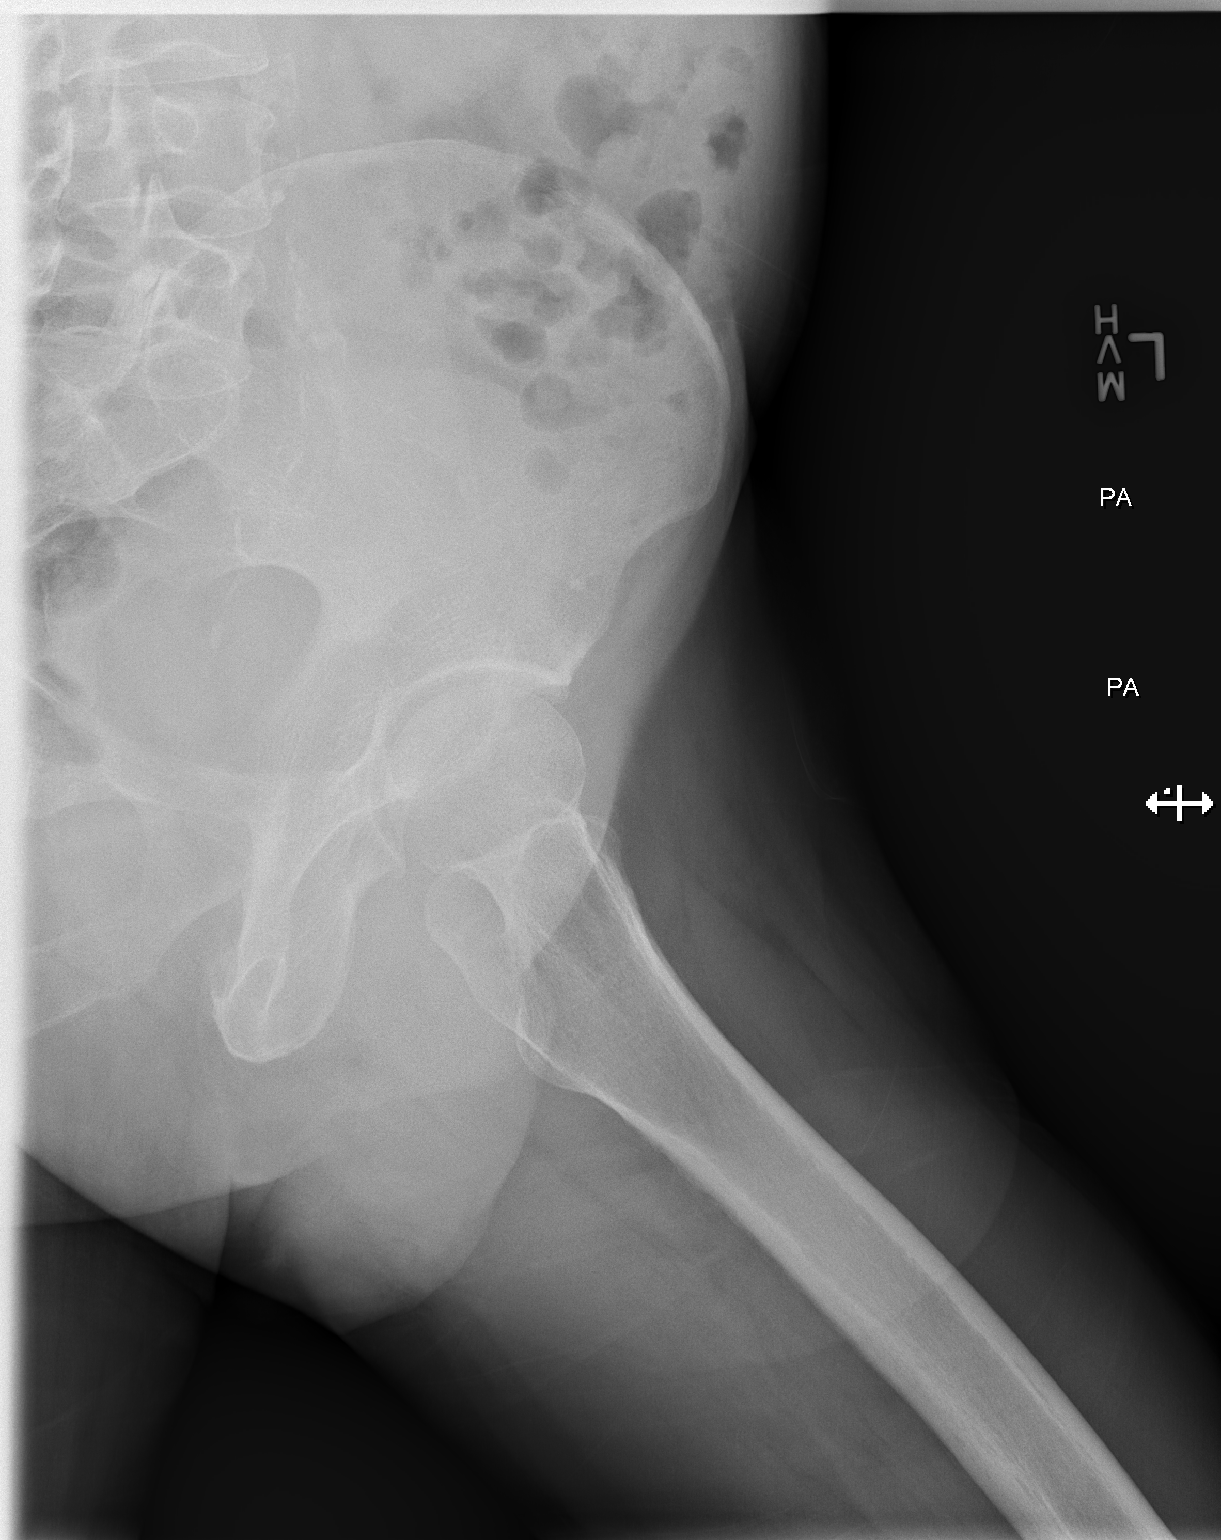

[t hip frog leg left]
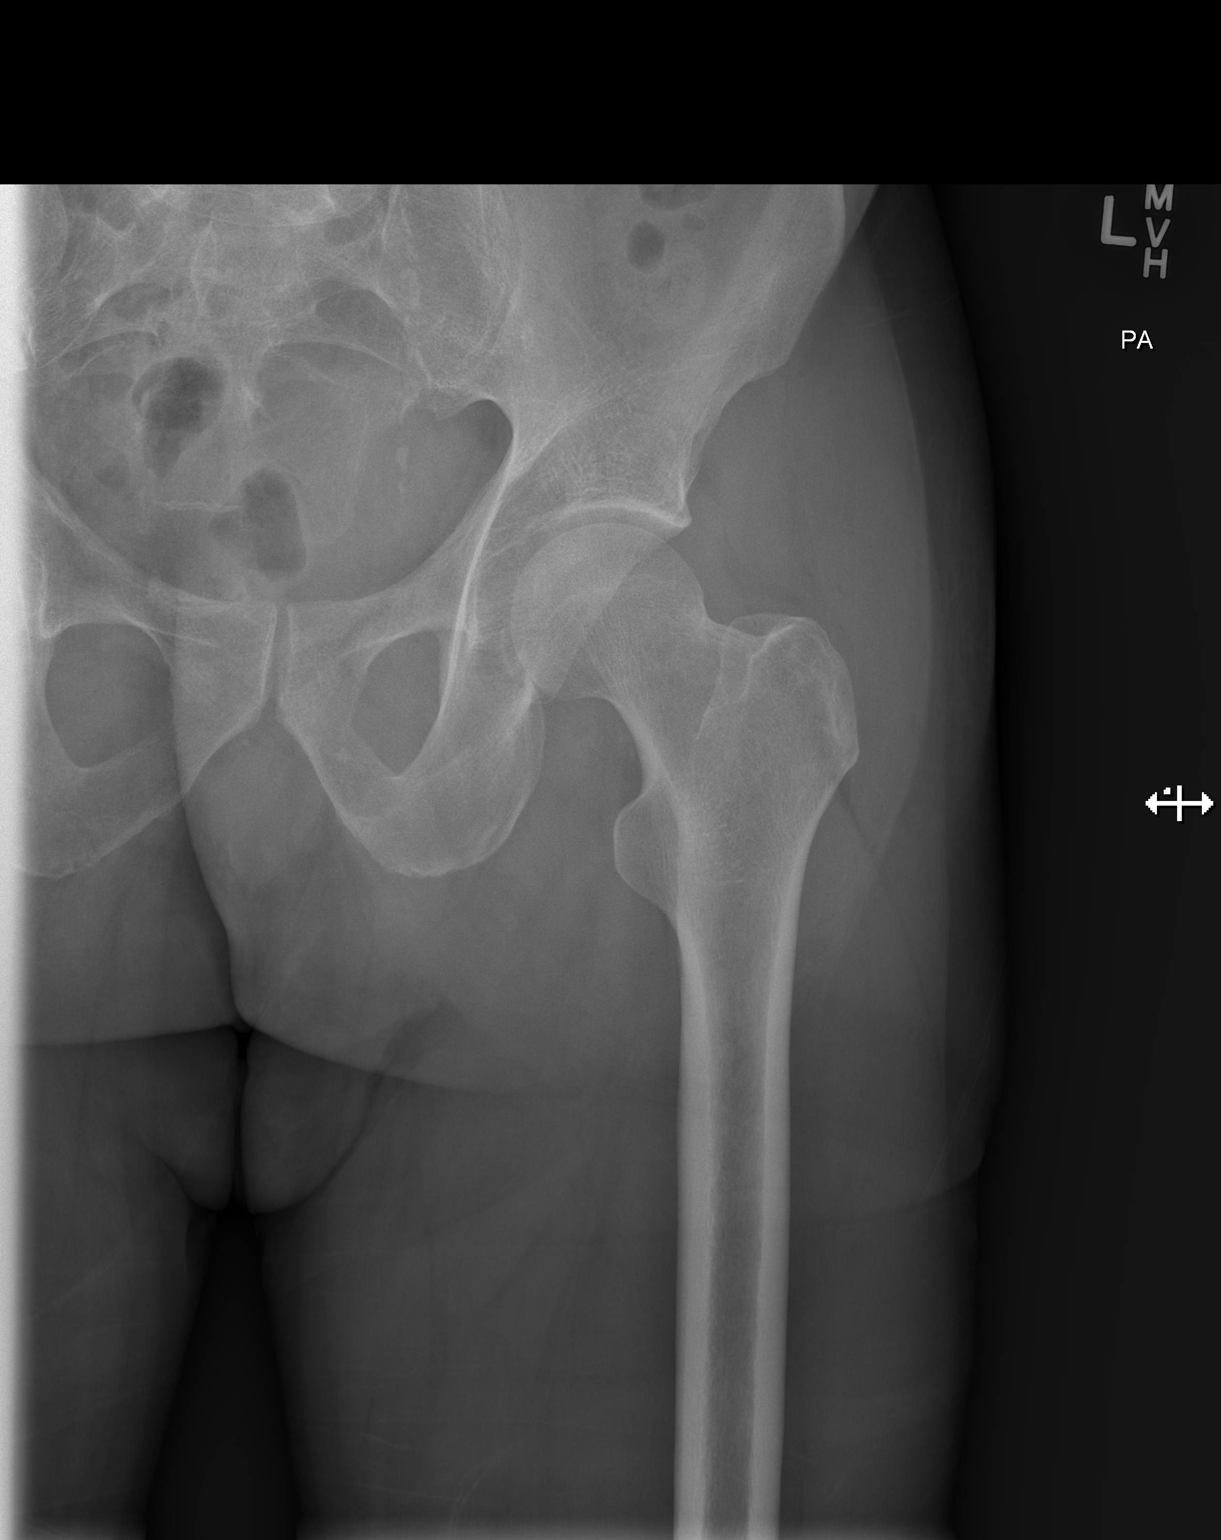

[3 of 3 positions shown; findings below may reference images not displayed]

FINDINGS: No evidence of acute fracture or dislocation of the pelvis or left
hip. SI joints and symphysis pubis are intact. No evidence of focal
bone lesion or bone destruction. Bone cortex and trabecular
architecture appear intact. Vascular calcifications.
IMPRESSION: No acute bony abnormalities demonstrated in the pelvis or left hip.

## 2015-05-06 ENCOUNTER — Other Ambulatory Visit: Payer: Self-pay | Admitting: Internal Medicine

## 2015-05-07 NOTE — Telephone Encounter (Signed)
Rx faxed to [facility] pharmacy.  

## 2015-05-07 NOTE — Telephone Encounter (Signed)
Ok 60 and 1 rf

## 2015-05-07 NOTE — Telephone Encounter (Signed)
Pt is requesting refill on Diazepam.  Last OV: 03/09/2015 Last Fill: 03/05/2015 #[id number] UDS: 04/25/2014 Low risk  Please advise.

## 2015-05-07 NOTE — Telephone Encounter (Signed)
Rx printed, awaiting MD signature.  

## 2015-05-18 ENCOUNTER — Emergency Department (HOSPITAL_COMMUNITY)
Admission: EM | Admit: 2015-05-18 | Discharge: 2015-05-18 | Discharge status: Against Medical Advice | Payer: Medicare Other | Attending: Emergency Medicine | Admitting: Emergency Medicine

## 2015-05-18 ENCOUNTER — Encounter (HOSPITAL_COMMUNITY): Payer: Self-pay | Admitting: Emergency Medicine

## 2015-05-18 DIAGNOSIS — Z720 Tobacco use: Secondary | ICD-10-CM | POA: Insufficient documentation

## 2015-05-18 DIAGNOSIS — I10 Essential (primary) hypertension: Secondary | ICD-10-CM | POA: Insufficient documentation

## 2015-05-18 DIAGNOSIS — I252 Old myocardial infarction: Secondary | ICD-10-CM | POA: Insufficient documentation

## 2015-05-18 DIAGNOSIS — R1010 Upper abdominal pain, unspecified: Secondary | ICD-10-CM | POA: Diagnosis not present

## 2015-05-18 DIAGNOSIS — I2510 Atherosclerotic heart disease of native coronary artery without angina pectoris: Secondary | ICD-10-CM | POA: Diagnosis not present

## 2015-05-18 LAB — COMPREHENSIVE METABOLIC PANEL
ALBUMIN: 3.9 g/dL (ref 3.5–5.0)
ALT: 17 U/L (ref 17–63)
AST: 20 U/L (ref 15–41)
Alkaline Phosphatase: 56 U/L (ref 38–126)
Anion gap: 4 — ABNORMAL LOW (ref 5–15)
BUN: 12 mg/dL (ref 6–20)
CHLORIDE: 105 mmol/L (ref 101–111)
CO2: 29 mmol/L (ref 22–32)
CREATININE: 0.75 mg/dL (ref 0.61–1.24)
Calcium: 8.8 mg/dL — ABNORMAL LOW (ref 8.9–10.3)
GFR calc Af Amer: >60 mL/min (ref >60)
GFR calc non Af Amer: >60 mL/min (ref >60)
GLUCOSE: 102 mg/dL — AB (ref 65–99)
POTASSIUM: 4.1 mmol/L (ref 3.5–5.1)
Sodium: 138 mmol/L (ref 135–145)
Total Bilirubin: 0.4 mg/dL (ref 0.3–1.2)
Total Protein: 6.9 g/dL (ref 6.5–8.1)

## 2015-05-18 LAB — CBC
HEMATOCRIT: 42.4 % (ref 39.0–52.0)
Hemoglobin: 14.8 g/dL (ref 13.0–17.0)
MCH: 31 pg (ref 26.0–34.0)
MCHC: 34.9 g/dL (ref 30.0–36.0)
MCV: 88.7 fL (ref 78.0–100.0)
PLATELETS: 184 10*3/uL (ref 150–400)
RBC: 4.78 MIL/uL (ref 4.22–5.81)
RDW: 14.7 % (ref 11.5–15.5)
WBC: 6.4 10*3/uL (ref 4.0–10.5)

## 2015-05-18 LAB — LIPASE, BLOOD: LIPASE: 27 U/L (ref 22–51)

## 2015-05-18 LAB — I-STAT TROPONIN, ED: Troponin i, poc: 0.01 ng/mL (ref 0.00–0.08)

## 2015-05-18 NOTE — ED Provider Notes (Signed)
54 year old male who presented to triage reporting upper abdominal pain after eating for the past 3 days. He admitted that he was under stress moving into a new home. Basic lab work including CMP, lipase, CBC, troponin were obtained in triage as well as EKG. I reviewed his lab work which was unremarkable and EKG without ischemic changes. Before I was able to evaluate the patient, he requested to leave [place]. He stated that he had a dog at home in a box and he needed to get home. Nursing reports that he was ambulatory without any acute distress and appeared to have decision-making capacity. Risks of leaving AMA reviewed by nursing staff and patient signed out AMA.  Sharlett Iles, MD 05/18/15 [phone]

## 2015-05-18 NOTE — ED Notes (Signed)
Pt report he can' t stay any longer due to having a dog at home in a box. Pt alert, oriented, and ambulatory upon leaving department.

## 2015-05-18 NOTE — ED Notes (Signed)
Pt reports upper abd pain after eating for the past 3 days. Pt under stress moving to a new home.

## 2015-05-29 ENCOUNTER — Telehealth: Payer: Self-pay | Admitting: Internal Medicine

## 2015-05-29 NOTE — Telephone Encounter (Signed)
Caller name: Samanyu Tinnell Relationship to patient: Pandora Leiter  Can be reached: [place]:  Reason for call: Pt says that he is changing medical offices and would like to get his records from the last 2 years. He says that he want [place] to get them ready for him for pick up and he will be in tomorrow to fill out a release form to get them.

## 2015-05-29 NOTE — Telephone Encounter (Signed)
Form will have to go to Medical Records dept after filling paperwork out. We do not print records over 3-6 month period here.

## 2015-06-05 ENCOUNTER — Ambulatory Visit: Payer: Medicare Other | Admitting: Internal Medicine

## 2015-07-24 ENCOUNTER — Other Ambulatory Visit: Payer: Self-pay | Admitting: Cardiology

## 2015-08-13 IMAGING — CR DG FINGER INDEX 2+V*L*
3 series · 3 of 3 positions shown · non-contrast
Comparison: None.

CLINICAL DATA: Penetration/ laceration of the left index finger 2-3
weeks ago by a screw. Swelling, pain, and stiffness.

EXAM:
LEFT INDEX FINGER 2+V

[x finger pa left]
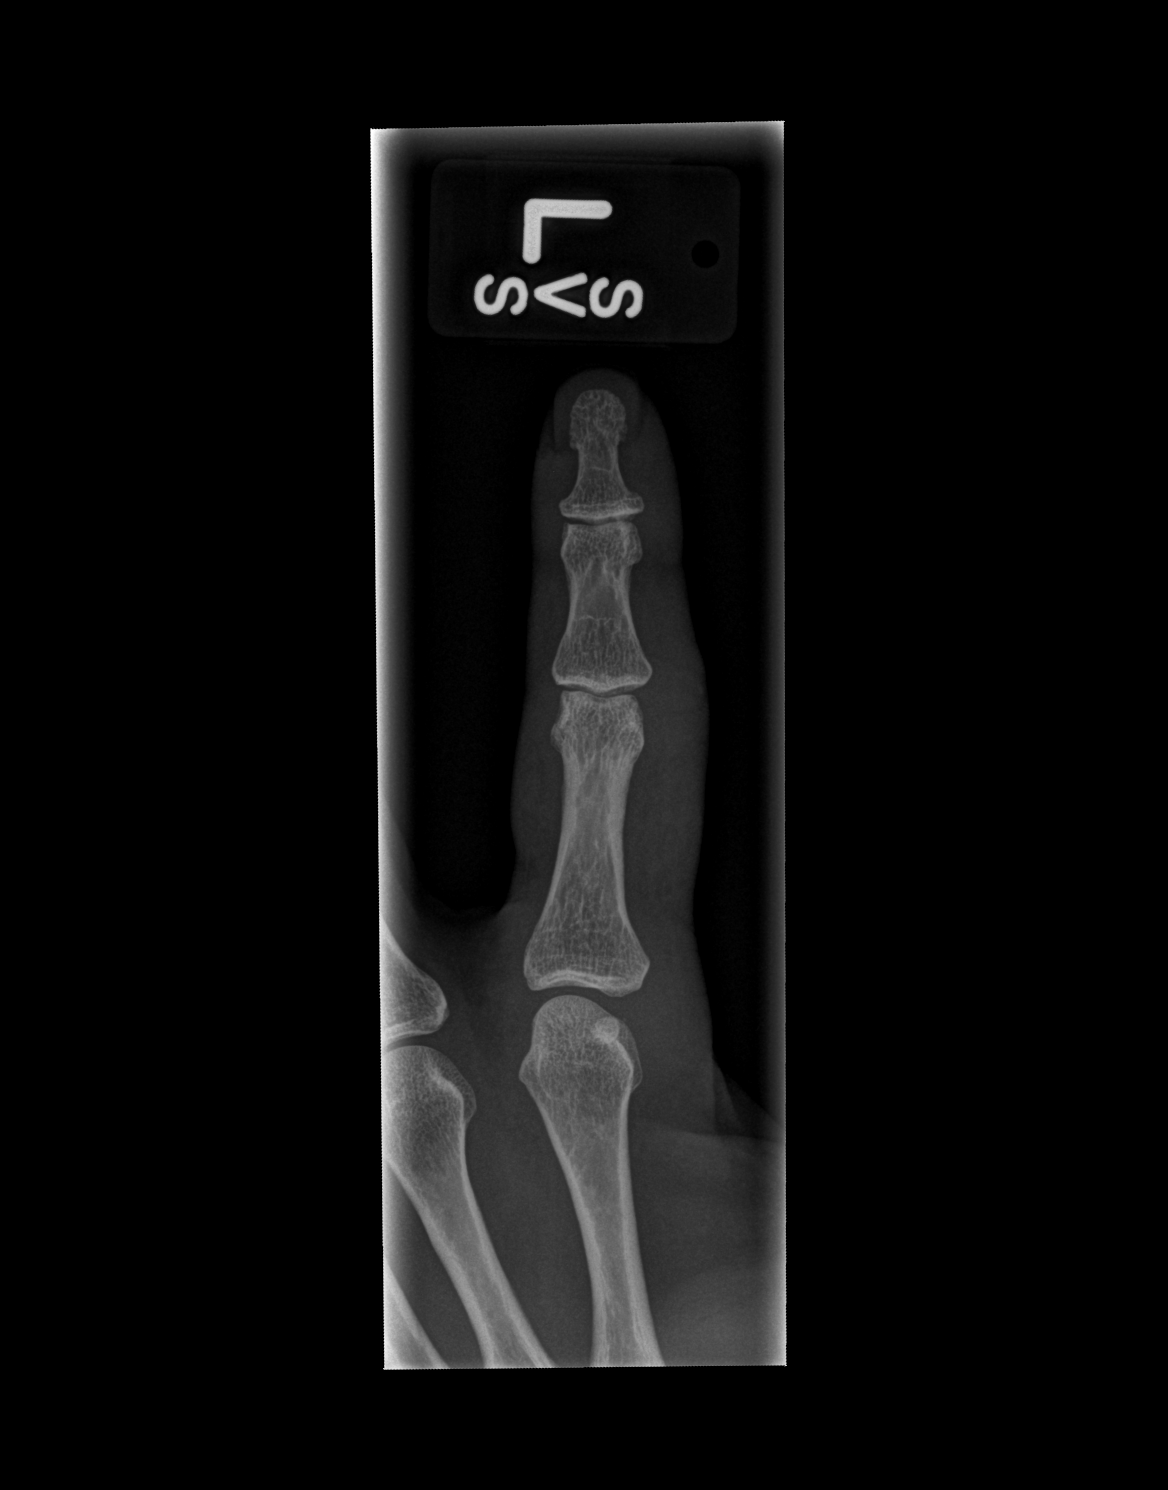

[x finger obl left]
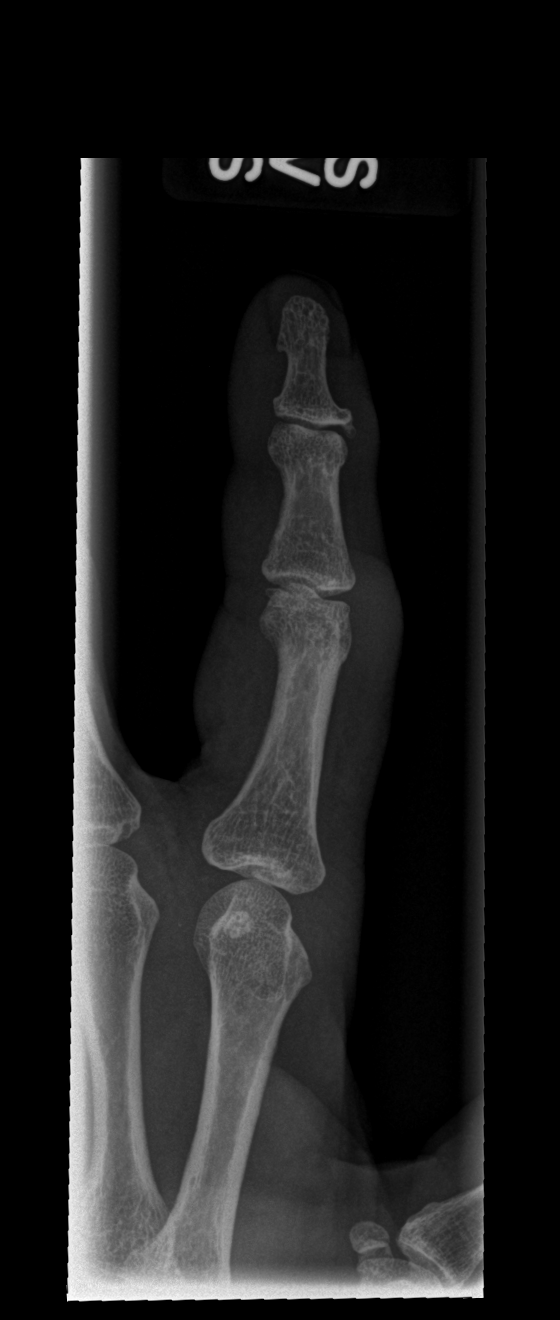

[x finger lat left]
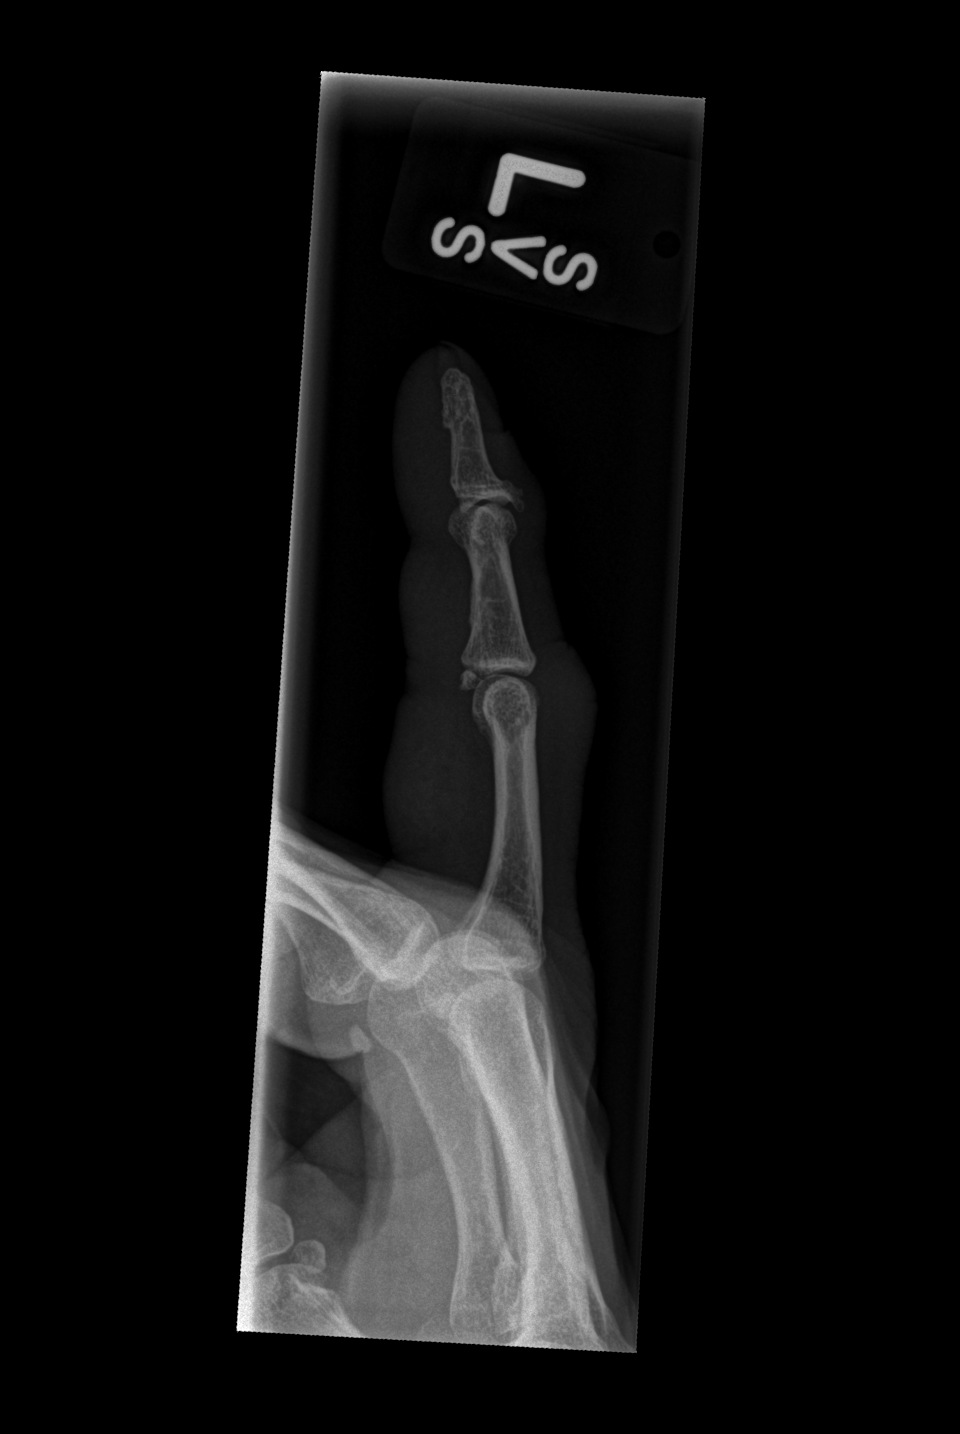

[3 of 3 positions shown; findings below may reference images not displayed]

FINDINGS: There is abnormal soft tissue swelling along the radial side of the
index finger. The swelling extends to the palmar side and, at the
level of the proximal interphalangeal joint, the dorsal side. No
retained metal foreign body.

There is spurring along both interphalangeal joints. Well corticated
fragmented volar spur at the base of the middle phalanx.
IMPRESSION: 1. Considerable soft tissue swelling in the finger, especially along
the radial side but extending to volar and dorsal sides of the
finger, probably representing infection in this clinical context. No
gas or retained foreign body in the soft tissues.
2. Osteoarthritis. Chronically fragmented spur at the volar base of
the middle phalanx.

## 2015-08-24 ENCOUNTER — Other Ambulatory Visit: Payer: Self-pay | Admitting: Cardiology

## 2015-08-24 ENCOUNTER — Other Ambulatory Visit: Payer: Self-pay | Admitting: Internal Medicine

## 2015-09-28 ENCOUNTER — Encounter: Payer: Self-pay | Admitting: Surgery

## 2015-10-08 ENCOUNTER — Ambulatory Visit: Payer: Medicare Other | Admitting: Surgery

## 2015-10-08 ENCOUNTER — Encounter (HOSPITAL_COMMUNITY): Payer: Medicare Other

## 2016-04-07 ENCOUNTER — Encounter (HOSPITAL_COMMUNITY): Payer: Medicare Other

## 2016-04-14 ENCOUNTER — Ambulatory Visit: Payer: Medicare Other | Admitting: Surgery

## 2017-09-28 DIAGNOSIS — I10 Essential (primary) hypertension: Secondary | ICD-10-CM

## 2017-09-28 DIAGNOSIS — E7800 Pure hypercholesterolemia, unspecified: Secondary | ICD-10-CM | POA: Insufficient documentation

## 2017-09-28 HISTORY — DX: Essential (primary) hypertension: I10

## 2017-09-28 HISTORY — DX: Pure hypercholesterolemia, unspecified: E78.00

## 2018-02-17 DIAGNOSIS — R0989 Other specified symptoms and signs involving the circulatory and respiratory systems: Secondary | ICD-10-CM | POA: Insufficient documentation

## 2018-02-17 HISTORY — DX: Other specified symptoms and signs involving the circulatory and respiratory systems: R09.89

## 2018-04-29 DIAGNOSIS — Z205 Contact with and (suspected) exposure to viral hepatitis: Secondary | ICD-10-CM

## 2018-04-29 HISTORY — DX: Contact with and (suspected) exposure to viral hepatitis: Z20.5

## 2018-10-04 DIAGNOSIS — Z599 Problem related to housing and economic circumstances, unspecified: Secondary | ICD-10-CM

## 2018-10-04 DIAGNOSIS — R636 Underweight: Secondary | ICD-10-CM

## 2018-10-04 HISTORY — DX: Underweight: R63.6

## 2018-10-04 HISTORY — DX: Problem related to housing and economic circumstances, unspecified: Z59.9

## 2019-04-28 DIAGNOSIS — Z95828 Presence of other vascular implants and grafts: Secondary | ICD-10-CM | POA: Insufficient documentation

## 2019-04-28 HISTORY — DX: Presence of other vascular implants and grafts: Z95.828

## 2019-07-25 DIAGNOSIS — S46011A Strain of muscle(s) and tendon(s) of the rotator cuff of right shoulder, initial encounter: Secondary | ICD-10-CM

## 2019-07-25 DIAGNOSIS — H9313 Tinnitus, bilateral: Secondary | ICD-10-CM

## 2019-07-25 DIAGNOSIS — S39012A Strain of muscle, fascia and tendon of lower back, initial encounter: Secondary | ICD-10-CM

## 2019-07-25 DIAGNOSIS — M87021 Idiopathic aseptic necrosis of right humerus: Secondary | ICD-10-CM

## 2019-07-25 DIAGNOSIS — S43021A Posterior subluxation of right humerus, initial encounter: Secondary | ICD-10-CM | POA: Insufficient documentation

## 2019-07-25 HISTORY — DX: Strain of muscle(s) and tendon(s) of the rotator cuff of right shoulder, initial encounter: S46.011A

## 2019-07-25 HISTORY — DX: Tinnitus, bilateral: H93.13

## 2019-07-25 HISTORY — DX: Posterior subluxation of right humerus, initial encounter: S43.021A

## 2019-07-25 HISTORY — DX: Strain of muscle, fascia and tendon of lower back, initial encounter: S39.012A

## 2019-07-25 HISTORY — DX: Idiopathic aseptic necrosis of right humerus: M87.021

## 2020-04-24 ENCOUNTER — Ambulatory Visit (INDEPENDENT_AMBULATORY_CARE_PROVIDER_SITE_OTHER): Payer: Medicare PPO | Admitting: Cardiology

## 2020-04-24 ENCOUNTER — Other Ambulatory Visit: Payer: Self-pay

## 2020-04-24 ENCOUNTER — Encounter: Payer: Self-pay | Admitting: Cardiology

## 2020-04-24 VITALS — BP 124/72 | HR 67 | Ht 69.5 in | Wt 118.0 lb

## 2020-04-24 DIAGNOSIS — I6523 Occlusion and stenosis of bilateral carotid arteries: Secondary | ICD-10-CM

## 2020-04-24 DIAGNOSIS — E785 Hyperlipidemia, unspecified: Secondary | ICD-10-CM

## 2020-04-24 DIAGNOSIS — I739 Peripheral vascular disease, unspecified: Secondary | ICD-10-CM

## 2020-04-24 DIAGNOSIS — R0989 Other specified symptoms and signs involving the circulatory and respiratory systems: Secondary | ICD-10-CM

## 2020-04-24 DIAGNOSIS — F17200 Nicotine dependence, unspecified, uncomplicated: Secondary | ICD-10-CM | POA: Diagnosis not present

## 2020-04-24 NOTE — Progress Notes (Signed)
Cardiology Consultation:    Date:  04/24/2020   ID:  [name], DOB 06-11-1961, MRN [id number]  PCP:  [facility]  Cardiologist:  Jenne Campus, MD   Referring MD: Reita May, NP   No chief complaint on file. I would like to be establish as a patient  History of Present Illness:    Aaron Velez is a 59 y.o. male who is being seen today for the evaluation of coronary artery disease at the request of Reita May, NP.  He is a chronic smoker he did have multiple coronary interventions done first 1 in 2001, I have no information about that intervention, then another intervention in 2011, I do not have information about that one either, finally last time in 2019 when he required 2 stents to the right coronary artery.  He also got some nonsignificant lesion in the left anterior descending artery at that time.  He also have peripheral vascular disease which include right carotic endarterectomy and he was also told to have some narrowing the left side of his neck as well.  Does not have diabetes does have hypertension does have dyslipidemia.  Biggest issue is the fact that he still continues to smoke between 1 and 2 packs/day.  He comes today to our office because he would like to be establish as a patient.  Denies having a chest pain tightness squeezing pressure burning chest shortness of breath is there with exertion denies having swelling of lower extremities but does have some pain while walking especially in the right lower extremity.  Past Medical History:  Diagnosis Date  . Anxiety   . CAD (coronary artery disease)   . Carotid artery occlusion   . HLD (hyperlipidemia)   . HTN (hypertension)   . Myocardial infarction [facility]) 2001 & 2008  . Shortness of breath     Past Surgical History:  Procedure Laterality Date  . CAROTID ANGIOGRAM Right 07/13/2012   Procedure: CAROTID ANGIOGRAM;  Surgeon: Serafina Mitchell, MD;  Location: [facility] CATH LAB;  Service:  Cardiovascular;  Laterality: Right;  . CAROTID ENDARTERECTOMY  12/05/2010   right CEA  . CAROTID STENT INSERTION N/A 01/18/2013   Procedure: CAROTID STENT INSERTION;  Surgeon: Serafina Mitchell, MD;  Location: [facility] CATH LAB;  Service: Cardiovascular;  Laterality: N/A;  . CORONARY ANGIOPLASTY WITH STENT PLACEMENT  2001,2007,2008  . FINGER SURGERY     For brown recluse spider bite  . PERCUTANEOUS PLACEMENT INTRAVASCULAR STENT CERVICAL CAROTID ARTERY Right 01-2013    Current Medications: Current Meds  Medication Sig  . aspirin 81 MG tablet Take 81 mg by mouth daily.    Marland Kitchen atorvastatin (LIPITOR) 80 MG tablet Take 80 mg by mouth daily.  . carvedilol (COREG) 6.25 MG tablet Take 1 tablet (6.25 mg total) by mouth 2 (two) times daily.  . clopidogrel (PLAVIX) 75 MG tablet take 1 tablet by mouth once daily  . diazepam (VALIUM) 5 MG tablet Take 1 tablet (5 mg total) by mouth every 12 (twelve) hours as needed for anxiety.  Marland Kitchen lisinopril (ZESTRIL) 20 MG tablet Take 20 mg by mouth daily.  . nitroGLYCERIN (NITROSTAT) 0.4 MG SL tablet Place 1 tablet (0.4 mg total) under the tongue every 5 (five) minutes as needed for chest pain.     Allergies:   Penicillins   Social History   Socioeconomic History  . Marital status: Married    Spouse name: Not on file  . Number of children: 4  .  Years of education: Not on file  . Highest education level: Not on file  Occupational History  . Occupation: retired - Agricultural consultant: UNEMPLOYED  Tobacco Use  . Smoking status: Current Every Day Smoker    Packs/day: 1.00    Years: 40.00    Pack years: 40.00    Types: Cigarettes  . Smokeless tobacco: Former Systems developer    Types: Chew    Quit date: 09/08/1978  . Tobacco comment: 2 PPD  Substance and Sexual Activity  . Alcohol use: Yes    Alcohol/week: 0.0 standard drinks    Comment: socially   . Drug use: Yes  . Sexual activity: Not on file  Other Topics Concern  . Not on file  Social History Narrative   HS  Diploma.   Household-- wife moved out 09-2014,    2 sons, has not talked to them in a while    2 daughters    Social Determinants of Radio broadcast assistant Strain:   . Difficulty of Paying Living Expenses:   Food Insecurity:   . Worried About Charity fundraiser in the Last Year:   . Arboriculturist in the Last Year:   Transportation Needs:   . Film/video editor (Medical):   Marland Kitchen Lack of Transportation (Non-Medical):   Physical Activity:   . Days of Exercise per Week:   . Minutes of Exercise per Session:   Stress:   . Feeling of Stress :   Social Connections:   . Frequency of Communication with Friends and Family:   . Frequency of Social Gatherings with Friends and Family:   . Attends Religious Services:   . Active Member of Clubs or Organizations:   . Attends Archivist Meetings:   Marland Kitchen Marital Status:      Family History: The patient's family history includes Coronary artery disease in his maternal uncle; Hyperlipidemia in his mother; Hypertension in his maternal uncle. There is no history of Cancer - Colon, Prostate cancer, Diabetes, Esophageal cancer, Kidney disease, Gallbladder disease, or Colon cancer. ROS:   Please see the history of present illness.    All 14 point review of systems negative except as described per history of present illness.  EKGs/Labs/Other Studies Reviewed:    The following studies were reviewed today:   EKG:  EKG is  ordered today.  The ekg ordered today demonstrates normal sinus rhythm, normal P interval, normal QS complex duration morphology no ST segment changes.  Recent Labs: No results found for requested labs within last 8760 hours.  Recent Lipid Panel    Component Value Date/Time   CHOL 139 02/09/2014 1003   TRIG 121.0 02/09/2014 1003   HDL 36.30 (L) 02/09/2014 1003   CHOLHDL 4 02/09/2014 1003   VLDL 24.2 02/09/2014 1003   LDLCALC 79 02/09/2014 1003   LDLDIRECT 147.1 11/09/2006 0817    Physical Exam:    VS:  BP  124/72   Pulse 67   Ht 5' 9.5" (1.765 m)   Wt 118 lb (53.5 kg)   SpO2 97%   BMI 17.18 kg/m     Wt Readings from Last 3 Encounters:  04/24/20 118 lb (53.5 kg)  04/02/15 122 lb (55.3 kg)  03/29/15 121 lb (54.9 kg)     GEN:  Well nourished, well developed in no acute distress HEENT: Normal NECK: No JVD; there is a scar on the right side of the cortical arterectomy, bruit on the left side as  well as supraclavicular bruit. LYMPHATICS: No lymphadenopathy CARDIAC: RRR, no murmurs, no rubs, no gallops RESPIRATORY: Poor air entry bilaterally with few wheezes ABDOMEN: Soft, non-tender, non-distended MUSCULOSKELETAL:  No edema; No deformity, I cannot palpate pulses in his feet, SKIN: Warm and dry NEUROLOGIC:  Alert and oriented x 3 PSYCHIATRIC:  Normal affect   ASSESSMENT:    1. PAD (peripheral artery disease) ([place])   2. Bilateral carotid artery stenosis   3. Left carotid bruit   4. Tobacco use disorder   5. Dyslipidemia    PLAN:    In order of problems listed above:  1. Coronary artery disease: Status post numerous stenting last time done in 2019.  Likely, he appears to be asymptomatic now.  I will ask him to have echocardiogram done to assess left ventricle ejection fraction.  He is on dual antiplatelets therapy which include aspirin as well as Plavix and I advised him to continue because of high risk for recurrences of the problem.  It looks like he does not need any antianginal medication right now however he is already on Coreg which I will continue. 2. Bilateral carotic arterial stenosis: He scheduled to see vascular surgeon for that with the next few weeks. 3. Left carotic bruit again he scheduled to see vascular surgeon. 4. Tobacco abuse he smokes between 1 to 2 packs/day and we had a long discussion strongly recommended to quit he understand he will try to do that. 5. Dyslipidemia: Recently his primary care physician switched him from atorvastatin 40 to Crestor.  Will wait 6  weeks before rechecking his fasting lipid profile. 6. Overall gentleman with advanced atherosclerosis involving multiple arteries.  There is no doubt in my mind that the leading issue is smoking which is still ongoing.  I will maintain him on dual antiplatelets therapy.  We will try to convince him to quit smoking.  We will reassess his carotid with echocardiogram.   Medication Adjustments/Labs and Tests Ordered: Current medicines are reviewed at length with the patient today.  Concerns regarding medicines are outlined above.  No orders of the defined types were placed in this encounter.  No orders of the defined types were placed in this encounter.   Signed, Park Liter, MD, [facility]. 04/24/2020 3:00 PM    [facility]

## 2020-04-24 NOTE — Patient Instructions (Signed)
Medication Instructions:  Your physician recommends that you continue on your current medications as directed. Please refer to the Current Medication list given to you today.  *If you need a refill on your cardiac medications before your next appointment, please call your pharmacy*   Lab Work: None   If you have labs (blood work) drawn today and your tests are completely normal, you will receive your results only by: . MyChart Message (if you have MyChart) OR . A paper copy in the mail If you have any lab test that is abnormal or we need to change your treatment, we will call you to review the results.   Testing/Procedures:  Your physician has requested that you have an echocardiogram. Echocardiography is a painless test that uses sound waves to create images of your heart. It provides your doctor with information about the size and shape of your heart and how well your heart's chambers and valves are working. This procedure takes approximately one hour. There are no restrictions for this procedure.      Follow-Up: At [facility], you and your health needs are our priority.  As part of our continuing mission to provide you with exceptional heart care, we have created designated Provider Care Teams.  These Care Teams include your primary Cardiologist (physician) and Advanced Practice Providers (APPs -  Physician Assistants and Nurse Practitioners) who all work together to provide you with the care you need, when you need it.  We recommend signing up for the patient portal called "MyChart".  Sign up information is provided on this After Visit Summary.  MyChart is used to connect with patients for Virtual Visits (Telemedicine).  Patients are able to view lab/test results, encounter notes, upcoming appointments, etc.  Non-urgent messages can be sent to your provider as well.   To learn more about what you can do with MyChart, go to [email or web address].    Your next appointment:   2  month(s)  The format for your next appointment:   In Person  Provider:   Robert Krasowski, MD   Other Instructions   Echocardiogram An echocardiogram is a procedure that uses painless sound waves (ultrasound) to produce an image of the heart. Images from an echocardiogram can provide important information about:  Signs of coronary artery disease (CAD).  Aneurysm detection. An aneurysm is a weak or damaged part of an artery wall that bulges out from the normal force of blood pumping through the body.  Heart size and shape. Changes in the size or shape of the heart can be associated with certain conditions, including heart failure, aneurysm, and CAD.  Heart muscle function.  Heart valve function.  Signs of a past heart attack.  Fluid buildup around the heart.  Thickening of the heart muscle.  A tumor or infectious growth around the heart valves. Tell a health care provider about:  Any allergies you have.  All medicines you are taking, including vitamins, herbs, eye drops, creams, and over-the-counter medicines.  Any blood disorders you have.  Any surgeries you have had.  Any medical conditions you have.  Whether you are pregnant or may be pregnant. What are the risks? Generally, this is a safe procedure. However, problems may occur, including:  Allergic reaction to dye (contrast) that may be used during the procedure. What happens before the procedure? No specific preparation is needed. You may eat and drink normally. What happens during the procedure?   An IV tube may be inserted into one of your veins.    You may receive contrast through this tube. A contrast is an injection that improves the quality of the pictures from your heart.  A gel will be applied to your chest.  A wand-like tool (transducer) will be moved over your chest. The gel will help to transmit the sound waves from the transducer.  The sound waves will harmlessly bounce off of your heart to  allow the heart images to be captured in real-time motion. The images will be recorded on a computer. The procedure may vary among health care providers and hospitals. What happens after the procedure?  You may return to your normal, everyday life, including diet, activities, and medicines, unless your health care provider tells you not to do that. Summary  An echocardiogram is a procedure that uses painless sound waves (ultrasound) to produce an image of the heart.  Images from an echocardiogram can provide important information about the size and shape of your heart, heart muscle function, heart valve function, and fluid buildup around your heart.  You do not need to do anything to prepare before this procedure. You may eat and drink normally.  After the echocardiogram is completed, you may return to your normal, everyday life, unless your health care provider tells you not to do that. This information is not intended to replace advice given to you by your health care provider. Make sure you discuss any questions you have with your health care provider. Document Revised: 12/16/2018 Document Reviewed: 09/27/2016 Elsevier Patient Education  2020 Elsevier Inc.   

## 2020-05-15 ENCOUNTER — Other Ambulatory Visit: Payer: Medicare PPO

## 2020-06-04 ENCOUNTER — Encounter (HOSPITAL_COMMUNITY): Payer: Medicare PPO

## 2020-06-04 ENCOUNTER — Encounter: Payer: Medicare PPO | Admitting: Surgery

## 2020-06-06 ENCOUNTER — Other Ambulatory Visit: Payer: Self-pay | Admitting: *Deleted

## 2020-06-06 ENCOUNTER — Other Ambulatory Visit: Payer: Self-pay

## 2020-06-06 DIAGNOSIS — E785 Hyperlipidemia, unspecified: Secondary | ICD-10-CM | POA: Insufficient documentation

## 2020-06-06 DIAGNOSIS — I739 Peripheral vascular disease, unspecified: Secondary | ICD-10-CM

## 2020-06-06 HISTORY — DX: Hyperlipidemia, unspecified: E78.5

## 2020-06-11 ENCOUNTER — Other Ambulatory Visit: Payer: Medicare PPO

## 2020-06-25 ENCOUNTER — Encounter (HOSPITAL_COMMUNITY): Payer: Medicare PPO

## 2020-06-25 ENCOUNTER — Encounter: Payer: Medicare PPO | Admitting: Surgery

## 2020-06-25 ENCOUNTER — Inpatient Hospital Stay (HOSPITAL_COMMUNITY): Admission: RE | Admit: 2020-06-25 | Payer: Medicare PPO | Source: Ambulatory Visit

## 2020-06-27 ENCOUNTER — Other Ambulatory Visit: Payer: Self-pay

## 2020-06-27 DIAGNOSIS — R0602 Shortness of breath: Secondary | ICD-10-CM | POA: Insufficient documentation

## 2020-06-27 DIAGNOSIS — I219 Acute myocardial infarction, unspecified: Secondary | ICD-10-CM | POA: Insufficient documentation

## 2020-06-27 DIAGNOSIS — E785 Hyperlipidemia, unspecified: Secondary | ICD-10-CM | POA: Insufficient documentation

## 2020-06-27 DIAGNOSIS — I6529 Occlusion and stenosis of unspecified carotid artery: Secondary | ICD-10-CM | POA: Insufficient documentation

## 2020-06-27 DIAGNOSIS — I10 Essential (primary) hypertension: Secondary | ICD-10-CM | POA: Insufficient documentation

## 2020-06-27 DIAGNOSIS — I2510 Atherosclerotic heart disease of native coronary artery without angina pectoris: Secondary | ICD-10-CM | POA: Insufficient documentation

## 2020-06-27 NOTE — Progress Notes (Deleted)
Cardiology Office Note:    Date:  06/27/2020   ID:  Aaron Velez, DOB September 08, 1961, MRN [id number]  PCP:  [facility]  Cardiologist:  Shirlee More, MD    Referring MD: [facility]    ASSESSMENT:    No diagnosis found. PLAN:    In order of problems listed above:  1. ***   Next appointment: ***   Medication Adjustments/Labs and Tests Ordered: Current medicines are reviewed at length with the patient today.  Concerns regarding medicines are outlined above.  No orders of the defined types were placed in this encounter.  No orders of the defined types were placed in this encounter.   No chief complaint on file.   History of Present Illness:    Aaron Velez is a 59 y.o. male with a hx of CAD peripheral arterial disease hypertension dyslipidemia.  He was last seen Dr. Agustin Cree 04/24/2020.  It was noted he continued to smoke and was given counseling.  He was not felt to require an ischemia evaluation at that time.  An echocardiogram was requested not performed.  Compliance with diet, lifestyle and medications: ***  Chart review epic shows a bare-metal stent right coronary artery 2001 bare-metal stent left circumflex 2007 in-stent restenosis of the right coronary artery with repeat PCI 2008 with inferior STEMI.  He is also had peripheral intervention with right carotid CEA and stenting performed.  Past Medical History:  Diagnosis Date  . Anxiety   . CAD (coronary artery disease)   . Carotid artery occlusion   . HLD (hyperlipidemia)   . HTN (hypertension)   . Myocardial infarction [facility]) 2001 & 2008  . Shortness of breath     Past Surgical History:  Procedure Laterality Date  . CAROTID ANGIOGRAM Right 07/13/2012   Procedure: CAROTID ANGIOGRAM;  Surgeon: Serafina Mitchell, MD;  Location: [facility] CATH LAB;  Service: Cardiovascular;  Laterality: Right;  . CAROTID ENDARTERECTOMY  12/05/2010   right CEA  . CAROTID STENT INSERTION N/A 01/18/2013    Procedure: CAROTID STENT INSERTION;  Surgeon: Serafina Mitchell, MD;  Location[facility] C[facility] LAB;  Service: Cardiovascular;  Laterality: N/A;  . CORONARY ANGIOPLASTY WITH STENT PLACEMENT  2001,2007,2008  . FINGER SURGERY     For brown recluse spider bite  . PERCUTANEOUS PLACEMENT INTRAVASCULAR STENT CERVICAL CAROTID ARTERY Right 01-2013    Current Medications: No outpatient medications have been marked as taking for the 06/28/20 encounter (Appointment) with Richardo Priest, MD.     Allergies:   Penicillins   Social History   Socioeconomic History  . Marital status: Married    Spouse name: Not on file  . Number of children: 4  . Years of education: Not on file  . Highest education level: Not on file  Occupational History  . Occupation: retired - Agricultural consultant: UNEMPLOYED  Tobacco Use  . Smoking status: Current Every Day Smoker    Packs/day: 1.00    Years: 40.00    Pack years: 40.00    Types: Cigarettes  . Smokeless tobacco: Former Systems developer    Types: Chew    Quit date: 09/08/1978  . Tobacco comment: 2 PPD  Substance and Sexual Activity  . Alcohol use: Yes    Alcohol/week: 0.0 standard drinks    Comment: socially   . Drug use: Yes  . Sexual activity: Not on file  Other Topics Concern  . Not on file  Social History Narrative   HS Diploma.  Household-- wife moved out 09-2014,    2 sons, has not talked to them in a while    2 daughters    Social Determinants of Health   Financial Resource Strain:   . Difficulty of Paying Living Expenses: Not on file  Food Insecurity:   . Worried About Charity fundraiser in the Last Year: Not on file  . Ran Out of Food in the Last Year: Not on file  Transportation Needs:   . Lack of Transportation (Medical): Not on file  . Lack of Transportation (Non-Medical): Not on file  Physical Activity:   . Days of Exercise per Week: Not on file  . Minutes of Exercise per Session: Not on file  Stress:   . Feeling of Stress : Not on file    Social Connections:   . Frequency of Communication with Friends and Family: Not on file  . Frequency of Social Gatherings with Friends and Family: Not on file  . Attends Religious Services: Not on file  . Active Member of Clubs or Organizations: Not on file  . Attends Archivist Meetings: Not on file  . Marital Status: Not on file     Family History: The patient's ***family history includes Coronary artery disease in his maternal uncle; Hyperlipidemia in his mother; Hypertension in his maternal uncle. There is no history of Cancer - Colon, Prostate cancer, Diabetes, Esophageal cancer, Kidney disease, Gallbladder disease, or Colon cancer. ROS:   Please see the history of present illness.    All other systems reviewed and are negative.  EKGs/Labs/Other Studies Reviewed:    The following studies were reviewed today:  EKG:  EKG ordered today and personally reviewed.  The ekg ordered today demonstrates ***  Recent Labs: No results found for requested labs within last 8760 hours.  Recent Lipid Panel    Component Value Date/Time   CHOL 139 02/09/2014 1003   TRIG 121.0 02/09/2014 1003   HDL 36.30 (L) 02/09/2014 1003   CHOLHDL 4 02/09/2014 1003   VLDL 24.2 02/09/2014 1003   LDLCALC 79 02/09/2014 1003   LDLDIRECT 147.1 11/09/2006 0817    Physical Exam:    VS:  There were no vitals taken for this visit.    Wt Readings from Last 3 Encounters:  04/24/20 118 lb (53.5 kg)  04/02/15 122 lb (55.3 kg)  03/29/15 121 lb (54.9 kg)     GEN: *** Well nourished, well developed in no acute distress HEENT: Normal NECK: No JVD; No carotid bruits LYMPHATICS: No lymphadenopathy CARDIAC: ***RRR, no murmurs, rubs, gallops RESPIRATORY:  Clear to auscultation without rales, wheezing or rhonchi  ABDOMEN: Soft, non-tender, non-distended MUSCULOSKELETAL:  No edema; No deformity  SKIN: Warm and dry NEUROLOGIC:  Alert and oriented x 3 PSYCHIATRIC:  Normal affect    Signed, Shirlee More, MD  06/27/2020 10:10 AM    [place]

## 2020-06-28 ENCOUNTER — Ambulatory Visit: Payer: Medicare PPO | Admitting: Cardiology

## 2020-10-04 ENCOUNTER — Telehealth: Payer: Self-pay

## 2020-10-04 NOTE — Telephone Encounter (Signed)
Called the requesting office of Dr. Nehemiah Settle and spoke with [facility]. I informed her that I have attempted to call patient  [name] to get him scheduled for an appointment for cardiac clearance and left a detailed voice message. She thanked me for calling and stated that she will pass the information to the doctor and nurse.

## 2020-10-04 NOTE — Telephone Encounter (Signed)
   [facility] Pre-operative Risk Assessment    Request for surgical clearance:  1. What type of surgery is being performed? Colonoscopy   2. When is this surgery scheduled? 10/29/2020   3. What type of clearance is required (medical clearance vs. Pharmacy clearance to hold med vs. Both)?Both   4. Are there any medications that need to be held prior to surgery and how long?ASA(time not specified)   5. Practice name and name of physician performing surgery? Dr. Nehemiah Settle   6. What is your office phone number: [phone]    7.   What is your office fax number: [phone]  8.   Anesthesia type (None, local, MAC, general) ? Propofol Sedation   Aaron Velez M Aaron Velez 10/04/2020, 9:24 AM  _________________________________________________________________   (provider comments below)

## 2020-10-04 NOTE — Telephone Encounter (Signed)
Called patient Aaron Velez and left a detailed voice message for the patient to give office a call back. To inform operator that he is calling to get scheduled for a pre op clearance appointment needing to be scheduled. I will try calling patient again.

## 2020-10-04 NOTE — Telephone Encounter (Signed)
Primary Cardiologist:Robert Agustin Cree, MD  Chart reviewed as part of pre-operative protocol coverage. Because of Aaron Velez's past medical history and time since last visit, he/she will require a follow-up visit in order to better assess preoperative cardiovascular risk.  Pre-op covering staff: - Please schedule appointment and call patient to inform them. - Please contact requesting surgeon's office via preferred method (i.e, phone, fax) to inform them of need for appointment prior to surgery.  If applicable, this message will also be routed to pharmacy pool and/or primary cardiologist for input on holding anticoagulant/antiplatelet agent as requested below so that this information is available at time of patient's appointment.   Deberah Pelton, NP  10/04/2020, 9:42 AM

## 2020-10-08 ENCOUNTER — Ambulatory Visit (INDEPENDENT_AMBULATORY_CARE_PROVIDER_SITE_OTHER): Payer: Medicare PPO | Admitting: Cardiology

## 2020-10-08 ENCOUNTER — Encounter: Payer: Self-pay | Admitting: Cardiology

## 2020-10-08 ENCOUNTER — Other Ambulatory Visit: Payer: Self-pay

## 2020-10-08 VITALS — BP 92/50 | HR 71 | Ht 69.0 in | Wt 117.0 lb

## 2020-10-08 DIAGNOSIS — F17200 Nicotine dependence, unspecified, uncomplicated: Secondary | ICD-10-CM | POA: Diagnosis not present

## 2020-10-08 DIAGNOSIS — I739 Peripheral vascular disease, unspecified: Secondary | ICD-10-CM

## 2020-10-08 DIAGNOSIS — E782 Mixed hyperlipidemia: Secondary | ICD-10-CM

## 2020-10-08 DIAGNOSIS — I10 Essential (primary) hypertension: Secondary | ICD-10-CM

## 2020-10-08 DIAGNOSIS — I25119 Atherosclerotic heart disease of native coronary artery with unspecified angina pectoris: Secondary | ICD-10-CM

## 2020-10-08 NOTE — Patient Instructions (Signed)
Medication Instructions:  Your physician recommends that you continue on your current medications as directed. Please refer to the Current Medication list given to you today.  *If you need a refill on your cardiac medications before your next appointment, please call your pharmacy*   Lab Work: None. If you have labs (blood work) drawn today and your tests are completely normal, you will receive your results only by: Marland Kitchen MyChart Message (if you have MyChart) OR . A paper copy in the mail If you have any lab test that is abnormal or we need to change your treatment, we will call you to review the results.   Testing/Procedures:   [facility] [address] [place], [place] [address] Phone:  [phone]    Please arrive 15 minutes prior to your appointment time for registration and insurance purposes.  The test will take approximately 3 to 4 hours to complete; you may bring reading material.  If someone comes with you to your appointment, they will need to remain in the main lobby due to limited space in the testing area. **If you are pregnant or breastfeeding, please notify the nuclear lab prior to your appointment**  How to prepare for your Myocardial Perfusion Test: . Do not eat or drink 3 hours prior to your test, except you may have water. . Do not consume products containing caffeine (regular or decaffeinated) 12 hours prior to your test. (ex: coffee, chocolate, sodas, tea). . Do bring a list of your current medications with you.  If not listed below, you may take your medications as normal.  . Do wear comfortable clothes (no dresses or overalls) and walking shoes, tennis shoes preferred (No heels or open toe shoes are allowed). . Do NOT wear cologne, perfume, aftershave, or lotions (deodorant is allowed). . If these instructions are not followed, your test will have to be rescheduled.  Please report to [address] for your test.  If you have  questions or concerns about your appointment, you can call the [facility] at [phone].  If you cannot keep your appointment, please provide 24 hours notification to the Nuclear Lab, to avoid a possible $50 charge to your account.  Your physician has requested that you have a carotid duplex. This test is an ultrasound of the carotid arteries in your neck. It looks at blood flow through these arteries that supply the brain with blood. Allow one hour for this exam. There are no restrictions or special instructions.    Follow-Up: At [facility], you and your health needs are our priority.  As part of our continuing mission to provide you with exceptional heart care, we have created designated Provider Care Teams.  These Care Teams include your primary Cardiologist (physician) and Advanced Practice Providers (APPs -  Physician Assistants and Nurse Practitioners) who all work together to provide you with the care you need, when you need it.  We recommend signing up for the patient portal called "MyChart".  Sign up information is provided on this After Visit Summary.  MyChart is used to connect with patients for Virtual Visits (Telemedicine).  Patients are able to view lab/test results, encounter notes, upcoming appointments, etc.  Non-urgent messages can be sent to your provider as well.   To learn more about what you can do with MyChart, go to [email or web address].    Your next appointment:   3 month(s)  The format for your next appointment:   In Person  Provider:   Jenne Campus, MD   Other Instructions    Cardiac Nuclear Scan A cardiac nuclear scan is a test that measures blood flow to the heart when a person is resting and when he or she is exercising. The test looks for problems such as:  Not enough blood reaching a portion of the heart.  The heart muscle not working normally. You may need this test if:  You have heart disease.  You  have had abnormal lab results.  You have had heart surgery or a balloon procedure to open up blocked arteries (angioplasty).  You have chest pain.  You have shortness of breath. In this test, a radioactive dye (tracer) is injected into your bloodstream. After the tracer has traveled to your heart, an imaging device is used to measure how much of the tracer is absorbed by or distributed to various areas of your heart. This procedure is usually done at a hospital and takes 2-4 hours. Tell a health care provider about:  Any allergies you have.  All medicines you are taking, including vitamins, herbs, eye drops, creams, and over-the-counter medicines.  Any problems you or family members have had with anesthetic medicines.  Any blood disorders you have.  Any surgeries you have had.  Any medical conditions you have.  Whether you are pregnant or may be pregnant. What are the risks? Generally, this is a safe procedure. However, problems may occur, including:  Serious chest pain and heart attack. This is only a risk if the stress portion of the test is done.  Rapid heartbeat.  Sensation of warmth in your chest. This usually passes quickly.  Allergic reaction to the tracer. What happens before the procedure?  Ask your health care provider about changing or stopping your regular medicines. This is especially important if you are taking diabetes medicines or blood thinners.  Follow instructions from your health care provider about eating or drinking restrictions.  Remove your jewelry on the day of the procedure. What happens during the procedure?  An IV will be inserted into one of your veins.  Your health care provider will inject a small amount of radioactive tracer through the IV.  You will wait for 20-40 minutes while the tracer travels through your bloodstream.  Your heart activity will be monitored with an electrocardiogram (ECG).  You will lie down on an exam  table.  Images of your heart will be taken for about 15-20 minutes.  You may also have a stress test. For this test, one of the following may be done: ? You will exercise on a treadmill or stationary bike. While you exercise, your heart's activity will be monitored with an ECG, and your blood pressure will be checked. ? You will be given medicines that will increase blood flow to parts of your heart. This is done if you are unable to exercise.  When blood flow to your heart has peaked, a tracer will again be injected through the IV.  After 20-40 minutes, you will get back on the exam table and have more images taken of your heart.  Depending on the type of tracer used, scans may need to be repeated 3-4 hours later.  Your IV line will be removed when the procedure is over. The procedure may vary among health care providers and hospitals. What happens after the procedure?  Unless your health care provider tells you otherwise, you may return to your normal schedule, including diet, activities, and medicines.  Unless your health  care provider tells you otherwise, you may increase your fluid intake. This will help to flush the contrast dye from your body. Drink enough fluid to keep your urine pale yellow.  Ask your health care provider, or the department that is doing the test: ? When will my results be ready? ? How will I get my results? Summary  A cardiac nuclear scan measures the blood flow to the heart when a person is resting and when he or she is exercising.  Tell your health care provider if you are pregnant.  Before the procedure, ask your health care provider about changing or stopping your regular medicines. This is especially important if you are taking diabetes medicines or blood thinners.  After the procedure, unless your health care provider tells you otherwise, increase your fluid intake. This will help flush the contrast dye from your body.  After the procedure, unless  your health care provider tells you otherwise, you may return to your normal schedule, including diet, activities, and medicines. This information is not intended to replace advice given to you by your health care provider. Make sure you discuss any questions you have with your health care provider. Document Revised: 02/08/2018 Document Reviewed: 02/08/2018 Elsevier Patient Education  2021 [employer].

## 2020-10-08 NOTE — Progress Notes (Signed)
Cardiology Office Note:    Date:  10/08/2020   ID:  [name], DOB Jul 27, 1961, MRN [id number]  PCP:  [facility]  Cardiologist:  Jenne Campus, MD    Referring MD: [facility], [facility]   No chief complaint on file. I need to have colonoscopy  History of Present Illness:    Aaron Velez is a 60 y.o. male with complex past medical history.  That include coronary artery disease.  He did have a stenting done in 2001 2011 as well as 2019 I have no details about first 2 episodes that he required stenting in his coronary arteries however in 2019 he required 2 stents in the right coronary artery he is also a chronic smoker does have history of peripheral vascular disease status post right carotic endarterectomy, sadly still continues to smoke.  Comes today because he is scheduled to have colonoscopy.  So far I had the pleasure to meet him only once in the summer of this year at that time I wanted him to have an echocardiogram done however it did not happen.  Overall he said that he does well.  Denies have any chest pain tightness squeezing pressure burning chest.  He tells me that his ability to exercise is limited because of back pain as well as leg pain.  He does get some shortness of breath.  Still continues to smoke about half to 1 pack/day.  Past Medical History:  Diagnosis Date  . Abdominal bruit 02/17/2018  . Aftercare following surgery of the circulatory system, [place] 03/08/2014  . Annual physical exam 02/08/2014  . Anxiety   . Avascular necrosis of right humeral head ([place]) 07/25/2019   Formatting of this note might be different from the original. [place] 08/08/2018.  INDICATION: Dislocation reduction.  COMPARISON: Radiographs of the right shoulder performed approximately 30 minutes earlier on 08/08/2018.  AP and scapular Y-views of the right shoulder demonstrate interval reduction of the previously seen suspected posterior dislocation of the  right glenohumera  . Back strain 07/25/2019   Last Assessment & Plan:  Formatting of this note might be different from the original. Chronic.  Waxes and wanes.  Muscular in nature.  Trial NSAID and antispam.  . CAD (coronary artery disease)   . Carotid artery occlusion   . Carotid stenosis 11/25/2010   Qualifier: Diagnosis of  By: Rose Fillers, RN, Heather    . Coronary artery disease with PTCA and stenting done in 2001, 2011 last time 2019 with 2 stents to right coronary artery 11/11/2010   Qualifier: Diagnosis of  By: Haroldine Laws, MD, Eileen Stanford Dyslipidemia 02/17/2011  . 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 left arm  . Exposure to hepatitis C 04/29/2018   Last Assessment & Plan:  Formatting of this note might be different from the original. Check labs  . Financial difficulties 10/04/2018   Last Assessment & Plan:  Formatting of this note might be different from the original. CCM and SDOH referral placed  . HLD (hyperlipidemia)   . HTN (hypertension)   . Hyperlipidemia 06/06/2020  . Hypertension 09/28/2017   Last Assessment & Plan:  Formatting of this note might be different from the original. Chronic.  Worsening.  2/2 to noncompliance. Due to medication cost.  . Left carotid bruit 11/11/2010   Qualifier: Diagnosis of  By: Haroldine Laws, MD, Eileen Stanford Libido, decreased 10/26/2014  . Loss of  weight 12/22/2014  . Myocardial infarction [facility]) 2001 & 2008  . PAD (peripheral artery disease) ([place]) 08/27/2014  . Posterior dislocation of right shoulder joint 07/25/2019   Formatting of this note might be different from the original. [place] 08/08/2018.  INDICATION: Dislocation reduction.  COMPARISON: Radiographs of the right shoulder performed approximately 30 minutes earlier on 08/08/2018.  AP and scapular Y-views of the right shoulder demonstrate interval reduction of the previously seen suspected posterior dislocation of the right  glenohumera  . Pure hypercholesterolemia 09/28/2017   Formatting of this note is different from the original. Component Ref Range & Units 10d ago  Triglycerides 10 - 149 mg/dl 85   Cholesterol 0 - 199 mg/dl 173   LDL Calculated 0 - 100 mg/dL 112High    HDL mg/dl 44.4     Last Assessment & Plan:  Formatting of this note might be different from the original.  Consider rechecking lipid profile in 3-6 months and if the LDL cholesterol level remains above  . S/P femoral-popliteal bypass surgery 04/28/2019  . Shortness of breath   . Tinnitus of both ears 07/25/2019   Last Assessment & Plan:  Formatting of this note might be different from the original. Chronic.  Worsening.  Check audiogram  . Tobacco use disorder 02/17/2011  . Traumatic incomplete tear of right rotator cuff 07/25/2019   Formatting of this note might be different from the original. [place] 08/08/2018.  INDICATION: Dislocation reduction.  COMPARISON: Radiographs of the right shoulder performed approximately 30 minutes earlier on 08/08/2018.  AP and scapular Y-views of the right shoulder demonstrate interval reduction of the previously seen suspected posterior dislocation of the right glenohumera  . Underweight 10/04/2018   Last Assessment & Plan:  Formatting of this note might be different from the original. Chronic.  Worsening.  Will update age appropriate cancer screenings.  Check labs.  Trial Megace.    Past Surgical History:  Procedure Laterality Date  . CAROTID ANGIOGRAM Right 07/13/2012   Procedure: CAROTID ANGIOGRAM;  Surgeon: Serafina Mitchell, MD;  Location: [facility] CATH LAB;  Service: Cardiovascular;  Laterality: Right;  . CAROTID ENDARTERECTOMY  12/05/2010   right CEA  . CAROTID STENT INSERTION N/A 01/18/2013   Procedure: CAROTID STENT INSERTION;  Surgeon: Serafina Mitchell, MD;  Location[facility] C[facility] LAB;  Service: Cardiovascular;  Laterality: N/A;  . CORONARY ANGIOPLASTY WITH STENT PLACEMENT  2001,2007,2008  . FINGER  SURGERY     For brown recluse spider bite  . PERCUTANEOUS PLACEMENT INTRAVASCULAR STENT CERVICAL CAROTID ARTERY Right 01-2013    Current Medications: Current Meds  Medication Sig  . aspirin 81 MG tablet Take 81 mg by mouth daily.  . carvedilol (COREG) 6.25 MG tablet Take 1 tablet (6.25 mg total) by mouth 2 (two) times daily.  . clopidogrel (PLAVIX) 75 MG tablet take 1 tablet by mouth once daily  . lisinopril (ZESTRIL) 20 MG tablet Take 20 mg by mouth daily.  . nitroGLYCERIN (NITROSTAT) 0.4 MG SL tablet Place 1 tablet (0.4 mg total) under the tongue every 5 (five) minutes as needed for chest pain.  . rosuvastatin (CRESTOR) 40 MG tablet Take 40 mg by mouth daily.     Allergies:   Penicillin g and Penicillins   Social History   Socioeconomic History  . Marital status: Married    Spouse name: Not on file  . Number of children: 4  . Years of education: Not on file  . Highest education level: Not on  file  Occupational History  . Occupation: retired - Agricultural consultant: UNEMPLOYED  Tobacco Use  . Smoking status: Current Every Day Smoker    Packs/day: 1.00    Years: 40.00    Pack years: 40.00    Types: Cigarettes  . Smokeless tobacco: Former Systems developer    Types: Chew    Quit date: 09/08/1978  . Tobacco comment: 2 PPD  Substance and Sexual Activity  . Alcohol use: Yes    Alcohol/week: 0.0 standard drinks    Comment: socially   . Drug use: Yes  . Sexual activity: Not on file  Other Topics Concern  . Not on file  Social History Narrative   HS Diploma.   Household-- wife moved out 09-2014,    2 sons, has not talked to them in a while    2 daughters    Social Determinants of Radio broadcast assistant Strain: Not on file  Food Insecurity: Not on file  Transportation Needs: Not on file  Physical Activity: Not on file  Stress: Not on file  Social Connections: Not on file     Family History: The patient's family history includes Coronary artery disease in his maternal  uncle; Hyperlipidemia in his mother; Hypertension in his maternal uncle. There is no history of Cancer - Colon, Prostate cancer, Diabetes, Esophageal cancer, Kidney disease, Gallbladder disease, or Colon cancer. ROS:   Please see the history of present illness.    All 14 point review of systems negative except as described per history of present illness  EKGs/Labs/Other Studies Reviewed:      Recent Labs: No results found for requested labs within last 8760 hours.  Recent Lipid Panel    Component Value Date/Time   CHOL 139 02/09/2014 1003   TRIG 121.0 02/09/2014 1003   HDL 36.30 (L) 02/09/2014 1003   CHOLHDL 4 02/09/2014 1003   VLDL 24.2 02/09/2014 1003   LDLCALC 79 02/09/2014 1003   LDLDIRECT 147.1 11/09/2006 0817    Physical Exam:    VS:  BP (!) 92/50 (BP Location: Right Arm, Patient Position: Sitting)   Pulse 71   Ht 5\' 9"  (1.753 m)   Wt 117 lb (53.1 kg)   SpO2 99%   BMI 17.28 kg/m     Wt Readings from Last 3 Encounters:  10/08/20 117 lb (53.1 kg)  04/24/20 118 lb (53.5 kg)  04/02/15 122 lb (55.3 kg)     GEN:  Well nourished, well developed in no acute distress HEENT: Normal NECK: No JVD; left carotid bruit LYMPHATICS: No lymphadenopathy CARDIAC: RRR, no murmurs, no rubs, no gallops RESPIRATORY:  Clear to auscultation without rales, wheezing or rhonchi  ABDOMEN: Soft, non-tender, non-distended MUSCULOSKELETAL:  No edema; No deformity  SKIN: Warm and dry LOWER EXTREMITIES: no swelling, I do not feel pulses in the lower extremities except for a very weak posterior tibial on the right side NEUROLOGIC:  Alert and oriented x 3 PSYCHIATRIC:  Normal affect   ASSESSMENT:    1. Coronary artery disease involving native coronary artery of native heart with angina pectoris ([place])   2. PAD (peripheral artery disease) ([place])   3. Primary hypertension   4. Tobacco use disorder   5. Mixed hyperlipidemia    PLAN:    In order of problems listed above:  1. Coronary  artery disease.  I offered him stress testing.  The reason I want to do it is the fact that his exercise capacity is limited most likely because  of claudications.  We did talk about multiple testing that should be done in his situation try to assess the sternal area.  Stress testing pain is minimal.  Also he need to have carotic ultrasounds to look at his neck.  Also talked to him about echocardiogram to assess left ventricle ejection fraction as well as segmental pressures in lower extremities to make sure there is no significant narrowing in his legs.  He said he still March and he prefers to have only stress test as well as cardiac ultrasound.  We will do that. 2. Peripheral vascular disease.  He is risk factors modifications probably the most important risk that need to be modified his smoking.  We spent at least 5 minutes talking about this I gave him different things about how to quit.  He required segmental pressures on his lower extremities at the minimum however he does not want to do it today.  We will continue this discussion. 3. Essential hypertension: Blood pressure seems to be on the lower side today.  We will continue monitoring. 4. Dyslipidemia: I wanted to have fasting lipid profile done on him but he thinks he had it done at [facility] clinic with the last 3 months will call [facility] clinic to get report of it. 5. Smoking obviously problem at least 5-minute discussion about quitting. 6. Overall gentleman with atherosclerosis with multiple issues still ongoing.  Multiple risk factors not modified.  At the minimum we will do Lexiscan make sure he does not have inducible ischemia as well as carotic ultrasound.  In terms of colonoscopy from my cardiac point review is considered very low risk procedure.  We will get those to test before about even without it should be fine to proceed.   Medication Adjustments/Labs and Tests Ordered: Current medicines are reviewed at length with the patient today.   Concerns regarding medicines are outlined above.  No orders of the defined types were placed in this encounter.  Medication changes: No orders of the defined types were placed in this encounter.   Signed, Park Liter, MD, [facility] 10/08/2020 8:46 AM    [place]

## 2020-10-08 NOTE — Addendum Note (Signed)
Addended by: Senaida Ores on: 10/08/2020 09:10 AM   Modules accepted: Orders

## 2020-10-10 ENCOUNTER — Telehealth: Payer: Self-pay

## 2020-10-10 NOTE — Telephone Encounter (Signed)
If stress test will be fine there is no need to follow-up at that time

## 2020-10-10 NOTE — Telephone Encounter (Signed)
Attempted to contact the patient. Detailed instructions were left on the patient's answering machine. S.Prince Couey EMTP

## 2020-10-15 NOTE — Addendum Note (Signed)
Addended by: Senaida Ores on: 10/15/2020 02:47 PM   Modules accepted: Orders

## 2020-10-15 NOTE — Addendum Note (Signed)
Addended by: Berniece Salines on: 10/15/2020 04:38 PM   Modules accepted: Orders

## 2020-10-16 ENCOUNTER — Telehealth: Payer: Self-pay

## 2020-10-16 ENCOUNTER — Other Ambulatory Visit: Payer: Self-pay

## 2020-10-16 ENCOUNTER — Ambulatory Visit (INDEPENDENT_AMBULATORY_CARE_PROVIDER_SITE_OTHER): Payer: Medicare PPO

## 2020-10-16 DIAGNOSIS — I739 Peripheral vascular disease, unspecified: Secondary | ICD-10-CM

## 2020-10-16 DIAGNOSIS — I25119 Atherosclerotic heart disease of native coronary artery with unspecified angina pectoris: Secondary | ICD-10-CM | POA: Diagnosis not present

## 2020-10-16 DIAGNOSIS — E782 Mixed hyperlipidemia: Secondary | ICD-10-CM

## 2020-10-16 DIAGNOSIS — I10 Essential (primary) hypertension: Secondary | ICD-10-CM | POA: Diagnosis not present

## 2020-10-16 DIAGNOSIS — F17200 Nicotine dependence, unspecified, uncomplicated: Secondary | ICD-10-CM | POA: Diagnosis not present

## 2020-10-16 LAB — MYOCARDIAL PERFUSION IMAGING
LV dias vol: 107 mL (ref 62–150)
LV sys vol: 53 mL
Peak HR: 86 {beats}/min
Rest HR: 65 {beats}/min
SDS: 6
SRS: 3
SSS: 9
TID: 1.16

## 2020-10-16 MED ORDER — TECHNETIUM TC 99M TETROFOSMIN IV KIT
10.8000 | PACK | Freq: Once | INTRAVENOUS | Status: AC | PRN
Start: 2020-10-16 — End: 2020-10-16
  Administered 2020-10-16: 10.8 via INTRAVENOUS

## 2020-10-16 MED ORDER — REGADENOSON 0.4 MG/5ML IV SOLN
0.4000 mg | Freq: Once | INTRAVENOUS | Status: AC
  Administered 2020-10-16: 0.4 mg via INTRAVENOUS

## 2020-10-16 MED ORDER — TECHNETIUM TC 99M TETROFOSMIN IV KIT
30.8000 | PACK | Freq: Once | INTRAVENOUS | Status: AC | PRN
  Administered 2020-10-16: 30.8 via INTRAVENOUS

## 2020-10-16 NOTE — Telephone Encounter (Signed)
   Primary Cardiologist: Jenne Campus, MD  Chart reviewed as part of pre-operative protocol coverage. Pt underwent nuclear stress testing yesterday. Per Dr. Agustin Cree, he is cleared for surgery without further testing.   I will route this recommendation to the requesting party via Epic fax function and remove from pre-op pool. Please call with questions.  Tami Lin Aradia Estey, PA 10/16/2020, 5:02 PM

## 2020-10-16 NOTE — Telephone Encounter (Signed)
Dr. Agustin Cree  Stress test completed today. Is he cleared for surgery?

## 2020-10-16 NOTE — Telephone Encounter (Signed)
Is good to go with procedures, stress test negative.

## 2020-10-17 ENCOUNTER — Telehealth: Payer: Self-pay

## 2020-10-17 NOTE — Telephone Encounter (Signed)
Patient notified of results and verbalized understanding.  

## 2020-10-17 NOTE — Telephone Encounter (Signed)
-----   Message from Park Liter, MD sent at 10/16/2020  4:38 PM EST ----- Stress test showed no evidence of ischemia

## 2020-10-17 NOTE — Telephone Encounter (Signed)
Left a message to return my call.

## 2020-10-17 NOTE — Telephone Encounter (Signed)
Pt called back in returning to [facility] number [phone]

## 2020-10-22 NOTE — Telephone Encounter (Signed)
Aaron Velez is calling stating they received the fax back in regards to this clearance, but it did not specify if the patient was okay to hold his Plavix for 5 days. Please advise.

## 2020-10-22 NOTE — Telephone Encounter (Signed)
   Multiple phone notes open on this patient regarding surgical clearance.  Recent note sent to preop pool: Aaron Velez is calling stating they received the fax back in regards to this clearance, but it did not specify if the patient was okay to hold his Plavix for 5 days. Please advise.   Will route to Dr. Agustin Cree to confirm holding plavix for 5 days is acceptable.  Richardson Dopp, PA-C    10/22/2020 3:30 PM

## 2020-10-23 NOTE — Telephone Encounter (Signed)
First question is that we really need to hold Plavix for colonoscopy?  It is always discussion about risks and benefits.  I think the risk of being on Plavix while having colonoscopy are minimal and ideally in my opinion should not be held if gastroenterologist insist on holding Plavix for 5 days we can do that however there is always some risk of holding dual antiplatelets therapy.

## 2020-10-24 NOTE — Telephone Encounter (Signed)
   Primary Cardiologist: Jenne Campus, MD  Chart reviewed as part of pre-operative protocol coverage.   See notes from Dr. Agustin Cree.  Summary of Recommendations  Pt ok to proceed with procedure at acceptable risk.  Per Dr. Agustin Cree, it is preferred the pt remain on ASA and Plavix without interruption.  If bleeding risk is too great, Plavix can be held for 5 days.  But, there will be some increased risk of CV events while off of Plavix.   The pt should remain on ASA without interruption.  Please call with questions. Richardson Dopp, PA-C 10/24/2020, 10:47 AM

## 2020-10-24 NOTE — Telephone Encounter (Signed)
S/w [place] with Dr. Carmie End office and confirmed that their office has received clearance notes with recommendations.

## 2020-10-24 NOTE — Telephone Encounter (Signed)
Notes faxed to surgeon. This phone note will be removed from the preop pool. Richardson Dopp, PA-C  10/24/2020 10:52 AM

## 2020-11-05 ENCOUNTER — Ambulatory Visit (INDEPENDENT_AMBULATORY_CARE_PROVIDER_SITE_OTHER): Payer: Medicare PPO

## 2020-11-05 ENCOUNTER — Other Ambulatory Visit: Payer: Self-pay

## 2020-11-05 DIAGNOSIS — I10 Essential (primary) hypertension: Secondary | ICD-10-CM | POA: Diagnosis not present

## 2020-11-05 DIAGNOSIS — F17200 Nicotine dependence, unspecified, uncomplicated: Secondary | ICD-10-CM | POA: Diagnosis not present

## 2020-11-05 DIAGNOSIS — E782 Mixed hyperlipidemia: Secondary | ICD-10-CM

## 2020-11-05 DIAGNOSIS — I25119 Atherosclerotic heart disease of native coronary artery with unspecified angina pectoris: Secondary | ICD-10-CM

## 2020-11-05 DIAGNOSIS — I739 Peripheral vascular disease, unspecified: Secondary | ICD-10-CM

## 2020-11-05 NOTE — Progress Notes (Addendum)
Carotid duplex exam performed  Jimmy Marisah Laker RDCS, RVT 

## 2020-11-07 ENCOUNTER — Telehealth: Payer: Self-pay | Admitting: Cardiology

## 2020-11-07 NOTE — Telephone Encounter (Signed)
Called patient informed him of results.

## 2020-11-07 NOTE — Telephone Encounter (Signed)
Patient calling because he has not gotten the results from his Carotid scan done 11/05/20. Please call to discuss results

## 2021-01-14 ENCOUNTER — Ambulatory Visit: Payer: Medicare PPO | Admitting: Cardiology

## 2021-07-09 HISTORY — PX: ABDOMINAL AORTA STENT: SHX1108

## 2021-11-19 DIAGNOSIS — F17219 Nicotine dependence, cigarettes, with unspecified nicotine-induced disorders: Secondary | ICD-10-CM | POA: Insufficient documentation

## 2021-11-19 DIAGNOSIS — J439 Emphysema, unspecified: Secondary | ICD-10-CM | POA: Insufficient documentation

## 2021-12-11 ENCOUNTER — Encounter: Payer: Self-pay | Admitting: Cardiology

## 2021-12-11 ENCOUNTER — Ambulatory Visit (INDEPENDENT_AMBULATORY_CARE_PROVIDER_SITE_OTHER): Payer: Medicare PPO | Admitting: Cardiology

## 2021-12-11 VITALS — BP 70/50 | HR 78 | Ht 69.0 in | Wt 120.4 lb

## 2021-12-11 DIAGNOSIS — I6523 Occlusion and stenosis of bilateral carotid arteries: Secondary | ICD-10-CM | POA: Diagnosis not present

## 2021-12-11 DIAGNOSIS — I25119 Atherosclerotic heart disease of native coronary artery with unspecified angina pectoris: Secondary | ICD-10-CM | POA: Diagnosis not present

## 2021-12-11 DIAGNOSIS — I10 Essential (primary) hypertension: Secondary | ICD-10-CM

## 2021-12-11 DIAGNOSIS — J431 Panlobular emphysema: Secondary | ICD-10-CM

## 2021-12-11 DIAGNOSIS — I2510 Atherosclerotic heart disease of native coronary artery without angina pectoris: Secondary | ICD-10-CM

## 2021-12-11 DIAGNOSIS — E785 Hyperlipidemia, unspecified: Secondary | ICD-10-CM

## 2021-12-11 MED ORDER — NITROGLYCERIN 0.4 MG SL SUBL: 0.4000 mg | SUBLINGUAL_TABLET | SUBLINGUAL | 3 refills | Status: AC | PRN

## 2021-12-11 NOTE — Progress Notes (Signed)
?Cardiology Office Note:   ? ?Date:  12/11/2021  ? ?ID:  Aaron Velez, DOB October 08, 1960, MRN [id number] ? ?PCP:  [facility]  ?Cardiologist:  Jenne Campus, MD   ? ?Referring MD: [facility]  ? ?Chief Complaint  ?Patient presents with  ? Extra  Heart Beat  ? ? ?History of Present Illness:   ? ?Aaron Velez is a 62 y.o. male with past medical history significant for coronary artery disease he did have PTCA and stenting done in 2001, 2011, 2019.  I do not have details about first 2 interventions however last intervention 2019 was done in his right coronary artery that he required 2 stents.  He is a chronic smoker does have advanced peripheral vascular disease status post right carotic endarterectomy, status post intervention in the right lower extremity and left left lower extremity done at the end of last year, he still continues to smoke.  He comes today to my office for follow-up.  Overall he says he is doing well since the angioplasty of his lower extremities he can walk climb stairs with no difficulties.  He is happy now denies have any chest pain tightness squeezing pressure burning chest. ? ?Past Medical History:  ?Diagnosis Date  ? Abdominal bruit 02/17/2018  ? Aftercare following surgery of the circulatory system, [place] 03/08/2014  ? Annual physical exam 02/08/2014  ? Anxiety   ? Avascular necrosis of right humeral head ([place]) 07/25/2019  ? Formatting of this note might be different from the original. RIGHT SHOULDER TWO VIEWS 08/08/2018.   INDICATION: Dislocation reduction.   COMPARISON: Radiographs of the right shoulder performed approximately 30 minutes earlier on 08/08/2018.   AP and scapular Y-views of the right shoulder demonstrate interval reduction of the previously seen suspected posterior dislocation of the right glenohumera  ? Back strain 07/25/2019  ? Last Assessment & Plan:  Formatting of this note might be different from the original. Chronic.  Waxes and wanes.  Muscular  in nature.  Trial NSAID and antispam.  ? CAD (coronary artery disease)   ? Carotid artery occlusion   ? Carotid stenosis 11/25/2010  ? Qualifier: Diagnosis of  By: Rose Fillers, RN, Heather    ? Coronary artery disease with PTCA and stenting done in 2001, 2011 last time 2019 with 2 stents to right coronary artery 11/11/2010  ? Qualifier: Diagnosis of  By: Haroldine Laws, MD, Eileen Stanford   ? Dyslipidemia 02/17/2011  ? 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 Formatting of this note might be different from the original. left arm  ? Exposure to hepatitis C 04/29/2018  ? Last Assessment & Plan:  Formatting of this note might be different from the original. Check labs  ? Financial difficulties 10/04/2018  ? Last Assessment & Plan:  Formatting of this note might be different from the original. CCM and SDOH referral placed  ? HLD (hyperlipidemia)   ? HTN (hypertension)   ? Hyperlipidemia 06/06/2020  ? Hypertension 09/28/2017  ? Last Assessment & Plan:  Formatting of this note might be different from the original. Chronic.  Worsening.  2/2 to noncompliance. Due to medication cost.  ? Left carotid bruit 11/11/2010  ? Qualifier: Diagnosis of  By: Haroldine Laws, MD, Eileen Stanford   ? Libido, decreased 10/26/2014  ? Loss of weight 12/22/2014  ? Myocardial infarction [facility]) 2001 & 2008  ? PAD (peripheral artery disease) ([place]) 08/27/2014  ? Posterior dislocation of right shoulder joint 07/25/2019  ? Formatting of this note might be  different from the original. RIGHT SHOULDER TWO VIEWS 08/08/2018.   INDICATION: Dislocation reduction.   COMPARISON: Radiographs of the right shoulder performed approximately 30 minutes earlier on 08/08/2018.   AP and scapular Y-views of the right shoulder demonstrate interval reduction of the previously seen suspected posterior dislocation of the right glenohumera  ? Pure hypercholesterolemia 09/28/2017  ? Formatting of this note is different from the original. Component Ref Range & Units 10d ago  Triglycerides 10 - 149 mg/dl 85   Cholesterol  0 - 199 mg/dl 173   LDL Calculated 0 - 100 mg/dL 112High    HDL mg/dl 44.4     Last Assessment & Plan:  Formatting of this note might be different from the original.  Consider rechecking lipid profile in 3-6 months and if the LDL cholesterol level remains above  ? S/P femoral-popliteal bypass surgery 04/28/2019  ? Shortness of breath   ? Tinnitus of both ears 07/25/2019  ? Last Assessment & Plan:  Formatting of this note might be different from the original. Chronic.  Worsening.  Check audiogram  ? Tobacco use disorder 02/17/2011  ? Traumatic incomplete tear of right rotator cuff 07/25/2019  ? Formatting of this note might be different from the original. RIGHT SHOULDER TWO VIEWS 08/08/2018.   INDICATION: Dislocation reduction.   COMPARISON: Radiographs of the right shoulder performed approximately 30 minutes earlier on 08/08/2018.   AP and scapular Y-views of the right shoulder demonstrate interval reduction of the previously seen suspected posterior dislocation of the right glenohumera  ? Underweight 10/04/2018  ? Last Assessment & Plan:  Formatting of this note might be different from the original. Chronic.  Worsening.  Will update age appropriate cancer screenings.  Check labs.  Trial Megace.  ? ? ?Past Surgical History:  ?Procedure Laterality Date  ? ABDOMINAL AORTA STENT  07/2021  ? CAROTID ANGIOGRAM Right 07/13/2012  ? Procedure: CAROTID ANGIOGRAM;  Surgeon: Serafina Mitchell, MD;  Location: [facility] CATH LAB;  Service: Cardiovascular;  Laterality: Right;  ? CAROTID ENDARTERECTOMY  12/05/2010  ? right CEA  ? CAROTID STENT INSERTION N/A 01/18/2013  ? Procedure: CAROTID STENT INSERTION;  Surgeon: Serafina Mitchell, MD;  Location: [facility] CATH LAB;  Service: Cardiovascular;  Laterality: N/A;  ? CORONARY ANGIOPLASTY WITH STENT PLACEMENT  2001,2007,2008  ? FINGER SURGERY    ? For brown recluse spider bite  ? PERCUTANEOUS PLACEMENT INTRAVASCULAR STENT CERVICAL CAROTID ARTERY Right 01/06/2013  ? ? ?Current Medications: ?Current Meds   ?Medication Sig  ? aspirin 81 MG tablet Take 81 mg by mouth daily.  ? carvedilol (COREG) 6.25 MG tablet Take 1 tablet (6.25 mg total) by mouth 2 (two) times daily.  ? clopidogrel (PLAVIX) 75 MG tablet take 1 tablet by mouth once daily (Patient taking differently: Take 75 mg by mouth daily.)  ? hydrOXYzine (ATARAX) 25 MG tablet Take 25 mg by mouth 2 (two) times daily as needed for anxiety.  ? lisinopril (ZESTRIL) 20 MG tablet Take 20 mg by mouth daily.  ? montelukast (SINGULAIR) 10 MG tablet Take 10 mg by mouth at bedtime.  ? nitroGLYCERIN (NITROSTAT) 0.4 MG SL tablet Place 1 tablet (0.4 mg total) under the tongue every 5 (five) minutes as needed for chest pain.  ? rosuvastatin (CRESTOR) 40 MG tablet Take 40 mg by mouth daily.  ? zinc gluconate 50 MG tablet Take 50 mg by mouth daily.  ?  ? ?Allergies:   Penicillin g and Penicillins  ? ?Social History  ? ?Socioeconomic History  ?  Marital status: Married  ?  Spouse name: Not on file  ? Number of children: 4  ? Years of education: Not on file  ? Highest education level: Not on file  ?Occupational History  ? Occupation: retired Animal nutritionist  ?  Employer: UNEMPLOYED  ?Tobacco Use  ? Smoking status: Every Day  ?  Packs/day: 1.00  ?  Years: 40.00  ?  Pack years: 40.00  ?  Types: Cigarettes  ? Smokeless tobacco: Former  ?  Types: Chew  ?  Quit date: 09/08/1978  ? Tobacco comments:  ?  2 PPD  ?Substance and Sexual Activity  ? Alcohol use: Yes  ?  Alcohol/week: 0.0 standard drinks  ?  Comment: socially   ? Drug use: Yes  ? Sexual activity: Not on file  ?Other Topics Concern  ? Not on file  ?Social History Narrative  ? HS Diploma.  ? Household-- wife moved out 09-2014,   ? 2 sons, has not talked to them in a while  ?  2 daughters   ? ?Social Determinants of Health  ? ?Financial Resource Strain: Not on file  ?Food Insecurity: Not on file  ?Transportation Needs: Not on file  ?Physical Activity: Not on file  ?Stress: Not on file  ?Social Connections: Not on file  ?  ? ?Family  History: ?The patient's family history includes Coronary artery disease in his maternal uncle; Hyperlipidemia in his mother; Hypertension in his maternal uncle. There is no history of Cancer - Colon, Pro

## 2021-12-11 NOTE — Patient Instructions (Signed)
Medication Instructions:  ?Your physician recommends that you continue on your current medications as directed. Please refer to the Current Medication list given to you today. ? ?*If you need a refill on your cardiac medications before your next appointment, please call your pharmacy* ? ? ?Lab Work: ?None ?If you have labs (blood work) drawn today and your tests are completely normal, you will receive your results only by: ?MyChart Message (if you have MyChart) OR ?A paper copy in the mail ?If you have any lab test that is abnormal or we need to change your treatment, we will call you to review the results. ? ? ?Testing/Procedures: ?Your physician has requested that you have a carotid duplex. This test is an ultrasound of the carotid arteries in your neck. It looks at blood flow through these arteries that supply the brain with blood. Allow one hour for this exam. There are no restrictions or special instructions.  ? ? ?Follow-Up: ?At [facility], you and your health needs are our priority.  As part of our continuing mission to provide you with exceptional heart care, we have created designated Provider Care Teams.  These Care Teams include your primary Cardiologist (physician) and Advanced Practice Providers (APPs -  Physician Assistants and Nurse Practitioners) who all work together to provide you with the care you need, when you need it. ? ?We recommend signing up for the patient portal called "MyChart".  Sign up information is provided on this After Visit Summary.  MyChart is used to connect with patients for Virtual Visits (Telemedicine).  Patients are able to view lab/test results, encounter notes, upcoming appointments, etc.  Non-urgent messages can be sent to your provider as well.   ?To learn more about what you can do with MyChart, go to [email or web address].   ? ?Your next appointment:   ?5 month(s) ? ?The format for your next appointment:   ?In Person ? ?Provider:   ?Jenne Campus, MD   ? ? ?Other Instructions ?None ? ?

## 2021-12-24 ENCOUNTER — Ambulatory Visit (INDEPENDENT_AMBULATORY_CARE_PROVIDER_SITE_OTHER): Payer: Medicare PPO

## 2021-12-24 DIAGNOSIS — I2510 Atherosclerotic heart disease of native coronary artery without angina pectoris: Secondary | ICD-10-CM

## 2021-12-24 DIAGNOSIS — J431 Panlobular emphysema: Secondary | ICD-10-CM

## 2021-12-24 DIAGNOSIS — I10 Essential (primary) hypertension: Secondary | ICD-10-CM

## 2021-12-24 DIAGNOSIS — E785 Hyperlipidemia, unspecified: Secondary | ICD-10-CM

## 2021-12-24 DIAGNOSIS — I6523 Occlusion and stenosis of bilateral carotid arteries: Secondary | ICD-10-CM | POA: Diagnosis not present

## 2021-12-24 DIAGNOSIS — I25119 Atherosclerotic heart disease of native coronary artery with unspecified angina pectoris: Secondary | ICD-10-CM

## 2022-05-21 ENCOUNTER — Ambulatory Visit: Payer: Medicare PPO | Admitting: Cardiology

## 2022-07-24 ENCOUNTER — Ambulatory Visit: Payer: Medicare PPO | Attending: Cardiology | Admitting: Cardiology

## 2022-07-29 ENCOUNTER — Encounter: Payer: Self-pay | Admitting: Cardiology
